# Patient Record
Sex: Male | Born: 1940 | Hispanic: Yes | Marital: Married | Smoking: Former smoker
Health system: Southern US, Community
[De-identification: ages and names within clinical notes are randomized; demographics above are authoritative.]

## PROBLEM LIST (undated history)

## (undated) DIAGNOSIS — K219 Gastro-esophageal reflux disease without esophagitis: Secondary | ICD-10-CM

## (undated) DIAGNOSIS — I10 Essential (primary) hypertension: Secondary | ICD-10-CM

## (undated) HISTORY — DX: Gastro-esophageal reflux disease without esophagitis: K21.9

## (undated) HISTORY — PX: NO PAST SURGERIES: SHX2092

## (undated) HISTORY — DX: Essential (primary) hypertension: I10

---

## 2012-09-11 DIAGNOSIS — M159 Polyosteoarthritis, unspecified: Secondary | ICD-10-CM | POA: Diagnosis not present

## 2012-09-11 DIAGNOSIS — IMO0001 Reserved for inherently not codable concepts without codable children: Secondary | ICD-10-CM | POA: Diagnosis not present

## 2012-09-11 DIAGNOSIS — IMO0002 Reserved for concepts with insufficient information to code with codable children: Secondary | ICD-10-CM | POA: Diagnosis not present

## 2012-09-11 DIAGNOSIS — M999 Biomechanical lesion, unspecified: Secondary | ICD-10-CM | POA: Diagnosis not present

## 2012-09-12 DIAGNOSIS — M999 Biomechanical lesion, unspecified: Secondary | ICD-10-CM | POA: Diagnosis not present

## 2012-09-12 DIAGNOSIS — M159 Polyosteoarthritis, unspecified: Secondary | ICD-10-CM | POA: Diagnosis not present

## 2012-09-12 DIAGNOSIS — IMO0002 Reserved for concepts with insufficient information to code with codable children: Secondary | ICD-10-CM | POA: Diagnosis not present

## 2012-09-12 DIAGNOSIS — IMO0001 Reserved for inherently not codable concepts without codable children: Secondary | ICD-10-CM | POA: Diagnosis not present

## 2012-09-20 DIAGNOSIS — IMO0001 Reserved for inherently not codable concepts without codable children: Secondary | ICD-10-CM | POA: Diagnosis not present

## 2012-09-20 DIAGNOSIS — IMO0002 Reserved for concepts with insufficient information to code with codable children: Secondary | ICD-10-CM | POA: Diagnosis not present

## 2012-09-20 DIAGNOSIS — M159 Polyosteoarthritis, unspecified: Secondary | ICD-10-CM | POA: Diagnosis not present

## 2012-09-20 DIAGNOSIS — M999 Biomechanical lesion, unspecified: Secondary | ICD-10-CM | POA: Diagnosis not present

## 2012-09-25 DIAGNOSIS — M159 Polyosteoarthritis, unspecified: Secondary | ICD-10-CM | POA: Diagnosis not present

## 2012-09-25 DIAGNOSIS — IMO0001 Reserved for inherently not codable concepts without codable children: Secondary | ICD-10-CM | POA: Diagnosis not present

## 2012-09-25 DIAGNOSIS — IMO0002 Reserved for concepts with insufficient information to code with codable children: Secondary | ICD-10-CM | POA: Diagnosis not present

## 2012-09-25 DIAGNOSIS — M999 Biomechanical lesion, unspecified: Secondary | ICD-10-CM | POA: Diagnosis not present

## 2012-10-02 DIAGNOSIS — IMO0001 Reserved for inherently not codable concepts without codable children: Secondary | ICD-10-CM | POA: Diagnosis not present

## 2012-10-02 DIAGNOSIS — M999 Biomechanical lesion, unspecified: Secondary | ICD-10-CM | POA: Diagnosis not present

## 2012-10-02 DIAGNOSIS — IMO0002 Reserved for concepts with insufficient information to code with codable children: Secondary | ICD-10-CM | POA: Diagnosis not present

## 2012-10-02 DIAGNOSIS — M159 Polyosteoarthritis, unspecified: Secondary | ICD-10-CM | POA: Diagnosis not present

## 2012-10-09 DIAGNOSIS — IMO0002 Reserved for concepts with insufficient information to code with codable children: Secondary | ICD-10-CM | POA: Diagnosis not present

## 2012-10-09 DIAGNOSIS — M999 Biomechanical lesion, unspecified: Secondary | ICD-10-CM | POA: Diagnosis not present

## 2012-10-09 DIAGNOSIS — M159 Polyosteoarthritis, unspecified: Secondary | ICD-10-CM | POA: Diagnosis not present

## 2012-10-09 DIAGNOSIS — IMO0001 Reserved for inherently not codable concepts without codable children: Secondary | ICD-10-CM | POA: Diagnosis not present

## 2012-10-16 DIAGNOSIS — M999 Biomechanical lesion, unspecified: Secondary | ICD-10-CM | POA: Diagnosis not present

## 2012-10-16 DIAGNOSIS — IMO0001 Reserved for inherently not codable concepts without codable children: Secondary | ICD-10-CM | POA: Diagnosis not present

## 2012-10-16 DIAGNOSIS — IMO0002 Reserved for concepts with insufficient information to code with codable children: Secondary | ICD-10-CM | POA: Diagnosis not present

## 2012-10-16 DIAGNOSIS — M159 Polyosteoarthritis, unspecified: Secondary | ICD-10-CM | POA: Diagnosis not present

## 2012-10-23 DIAGNOSIS — IMO0001 Reserved for inherently not codable concepts without codable children: Secondary | ICD-10-CM | POA: Diagnosis not present

## 2012-10-23 DIAGNOSIS — M999 Biomechanical lesion, unspecified: Secondary | ICD-10-CM | POA: Diagnosis not present

## 2012-10-23 DIAGNOSIS — IMO0002 Reserved for concepts with insufficient information to code with codable children: Secondary | ICD-10-CM | POA: Diagnosis not present

## 2012-10-23 DIAGNOSIS — M159 Polyosteoarthritis, unspecified: Secondary | ICD-10-CM | POA: Diagnosis not present

## 2012-10-30 DIAGNOSIS — IMO0001 Reserved for inherently not codable concepts without codable children: Secondary | ICD-10-CM | POA: Diagnosis not present

## 2012-10-30 DIAGNOSIS — M999 Biomechanical lesion, unspecified: Secondary | ICD-10-CM | POA: Diagnosis not present

## 2012-10-30 DIAGNOSIS — IMO0002 Reserved for concepts with insufficient information to code with codable children: Secondary | ICD-10-CM | POA: Diagnosis not present

## 2012-10-30 DIAGNOSIS — M159 Polyosteoarthritis, unspecified: Secondary | ICD-10-CM | POA: Diagnosis not present

## 2014-05-15 ENCOUNTER — Ambulatory Visit: Payer: Medicare Other | Attending: Family Medicine | Admitting: Family Medicine

## 2014-05-15 ENCOUNTER — Other Ambulatory Visit: Payer: Self-pay | Admitting: Family Medicine

## 2014-05-15 ENCOUNTER — Telehealth: Payer: Self-pay | Admitting: *Deleted

## 2014-05-15 ENCOUNTER — Ambulatory Visit (HOSPITAL_COMMUNITY)
Admission: RE | Admit: 2014-05-15 | Discharge: 2014-05-15 | Disposition: A | Discharge status: Home / Self Care | Payer: Medicare Other | Source: Ambulatory Visit | Attending: Family Medicine | Admitting: Family Medicine

## 2014-05-15 VITALS — BP 186/79 | HR 66 | Wt 170.4 lb

## 2014-05-15 DIAGNOSIS — M1612 Unilateral primary osteoarthritis, left hip: Secondary | ICD-10-CM | POA: Insufficient documentation

## 2014-05-15 DIAGNOSIS — Z0000 Encounter for general adult medical examination without abnormal findings: Secondary | ICD-10-CM | POA: Diagnosis not present

## 2014-05-15 DIAGNOSIS — M25579 Pain in unspecified ankle and joints of unspecified foot: Secondary | ICD-10-CM

## 2014-05-15 DIAGNOSIS — D72820 Lymphocytosis (symptomatic): Secondary | ICD-10-CM

## 2014-05-15 DIAGNOSIS — M25571 Pain in right ankle and joints of right foot: Secondary | ICD-10-CM | POA: Diagnosis not present

## 2014-05-15 DIAGNOSIS — M25552 Pain in left hip: Secondary | ICD-10-CM

## 2014-05-15 MED ORDER — LISINOPRIL 20 MG PO TABS: 20.0000 mg | ORAL_TABLET | Freq: Every day | ORAL | Status: DC

## 2014-05-15 MED ORDER — NAPROXEN 500 MG PO TABS: 500.0000 mg | ORAL_TABLET | Freq: Two times a day (BID) | ORAL | Status: DC

## 2014-05-15 NOTE — Patient Instructions (Signed)
Take naproxen with food twice a day. Go for x-ray for hip and foot. Follow-up with Dr. Adrian Blackwater as planned We will call about blood work if anything urgent, otherwise can discuss with Dr. Adrian Blackwater We are starting you on medication for high blood pressure. Take lisinopril once a day.

## 2014-05-15 NOTE — Progress Notes (Addendum)
Patient ID: Patrick Ortega, male   DOB: July 16, 1940, 74 y.o.   MRN: [id number]   Patrick Ortega, is a 74 y.o. male  BOF:[id number]  DPO:[id number]  DOB - [birth year]  CC:  Chief Complaint  Patient presents with  . Left hip pain    x 5 months   . Rt ankle pain       HPI: Patrick Ortega is a 74 y.o. male here today to establish medical care. He presents with hip pain as a presenting c/o.He denies other health problems. He was at one time told that his BP was high but he has never been treated. He has been experiencing the pain in his left hip and feet for about 7 months. He denies any definite injury. He has not tried any treatment.Due to language barrier, I feel unable to get an adequate description of the pain.He indicates the hip pain is in the joint and the foot pain is in the ankle.Information obtained with help of interpreter.   Not on File No past medical history on file. No current outpatient prescriptions on file prior to visit.   No current facility-administered medications on file prior to visit.   No family history on file. History   Social History  . Marital Status: Married    Spouse Name: N/A  . Number of Children: N/A  . Years of Education: N/A   Occupational History  . Not on file.   Social History Main Topics  . Smoking status: Not on file  . Smokeless tobacco: Not on file  . Alcohol Use: Not on file  . Drug Use: Not on file  . Sexual Activity: Not on file   Other Topics Concern  . Not on file   Social History Narrative  . No narrative on file    Review of Systems: Constitutional: Negative for fever, chills, appetite change, weight loss,  fatigue. HENT: Negative for ear pain, ear discharge.nose bleeds Eyes: Negative for pain, discharge, redness, itching and visual disturbance.Positive for growth on left eyeball Neck: Negative for pain, stiffness Respiratory: Negative for cough, chest  shortness of breath, ear  wheezing and stridor.  Cardiovascular:  Negative for chest pain, palpitations and leg swelling. Gastrointestinal: Negative for abdominal distention, abdominal pain, nausea, vomiting Genitourinary: Negative for dysuria, urgency, frequency, hematuria, flank pain,  Musculoskeletal: Negative for back pain, joint swelling, Positive for pain in left hip and both feet, greater in the right. Neurological: Negative for dizziness, tremors, seizures, syncope,  speech difficulty, weakness, light-headedness, numbness and headaches.  Hematological: Negative for easy bruising or bleeding Psychiatric/Behavioral:    Objective:   Filed Vitals:   05/15/14 1005  BP: 186/79  Pulse: 66    Physical Exam: Constitutional: Patient appears well-developed and well-nourished. No distress. HENT: Normocephalic, atraumatic, External right and left ear normal. Oropharynx is clear and moist.  Eyes:  PERRLA, no scleral icterus. There is a small pterygium on the left. Neck: Normal ROM. Neck supple. No JVD. No tracheal deviation. No thyromegaly. CVS: RRR, S1/S2 +, no murmurs, no gallops, no carotid bruit.  Pulmonary: Effort and breath sounds normal, no stridor, rhonchi, wheezes, rales.  Abdominal: Soft. BS +, no distension, tenderness, rebound or guarding.  Musculoskeletal: Normal range of motion. Except left hip. He does walk with a slight limp. There is pain with flexion and external and internal rotation. There is tenderness over the hip joint. No edema.  Lymphadenopathy: No lymphadenopathy noted, cervical, inguinal or axillary Neuro: Alert.Normal muscle tone coordination. No cranial nerve deficit. Skin:  Skin is warm and dry. No rash noted. Not diaphoretic. No erythema. No pallor. Psychiatric: Normal mood and affect. Behavior, judgment, thought content normal.  No results found for: WBC, HGB, HCT, MCV, PLT No results found for: CREATININE, BUN, NA, K, CL, CO2  No results found for: HGBA1C Lipid Panel  No results found for: CHOL, TRIG, HDL, CHOLHDL,  VLDL, LDLCALC     Assessment    1. Joint pain 2. Hypertension 3. Need for health care screening.  Plan: 1.  Naproxen 500 bid with food. 2.  Lisinopril 20 mg q day 3.  CMET, CBC, Lipid panel 4.  Follow-up with Dr. Adrian Blackwater next week. Has an appointment   NOTE:  The hip x-ray showed a lucency and bloodwork shows absolute lymphocytotis and abnormal lymphocytes on the smear. PLAN:  Referral to oncology hematologist.    The patient was given clear instructions to go to ER or return to medical center if symptoms don't improve, worsen or new problems develop. The patient verbalized understanding. The patient was told to call to get lab results if they haven't heard anything in the next week.     This note has been created with Surveyor, quantity. Any transcriptional errors are unintentional.    Micheline Chapman, MSN, FNP-BC [place], [place]   05/15/2014, 10:09 AM

## 2014-05-15 NOTE — Telephone Encounter (Signed)
[name] called to say Patrick Ortega N.P. Had put an order in for a left hip xray incorrectly.  They told me what to enter and I entered new order.

## 2014-05-16 LAB — CBC WITH DIFFERENTIAL/PLATELET
BASOS PCT: 1 % (ref 0–1)
Basophils Absolute: 0.1 10*3/uL (ref 0.0–0.1)
Eosinophils Absolute: 0.1 10*3/uL (ref 0.0–0.7)
Eosinophils Relative: 1 % (ref 0–5)
HCT: 43.4 % (ref 39.0–52.0)
HEMOGLOBIN: 14.3 g/dL (ref 13.0–17.0)
Lymphocytes Relative: 50 % — ABNORMAL HIGH (ref 12–46)
Lymphs Abs: 6.1 10*3/uL — ABNORMAL HIGH (ref 0.7–4.0)
MCH: 28.3 pg (ref 26.0–34.0)
MCHC: 32.9 g/dL (ref 30.0–36.0)
MCV: 85.9 fL (ref 78.0–100.0)
MPV: 8.9 fL (ref 8.6–12.4)
Monocytes Absolute: 1.1 10*3/uL — ABNORMAL HIGH (ref 0.1–1.0)
Monocytes Relative: 9 % (ref 3–12)
NEUTROS ABS: 4.7 10*3/uL (ref 1.7–7.7)
NEUTROS PCT: 39 % — AB (ref 43–77)
Platelets: 429 10*3/uL — ABNORMAL HIGH (ref 150–400)
RBC: 5.05 MIL/uL (ref 4.22–5.81)
RDW: 14.9 % (ref 11.5–15.5)
WBC: 12.1 10*3/uL — AB (ref 4.0–10.5)

## 2014-05-16 LAB — COMPLETE METABOLIC PANEL WITH GFR
ALT: 24 U/L (ref 0–53)
AST: 31 U/L (ref 0–37)
Albumin: 4.4 g/dL (ref 3.5–5.2)
Alkaline Phosphatase: 103 U/L (ref 39–117)
BILIRUBIN TOTAL: 0.7 mg/dL (ref 0.2–1.2)
BUN: 14 mg/dL (ref 6–23)
CALCIUM: 9.7 mg/dL (ref 8.4–10.5)
CHLORIDE: 102 meq/L (ref 96–112)
CO2: 26 mEq/L (ref 19–32)
CREATININE: 0.75 mg/dL (ref 0.50–1.35)
GFR, Est Non African American: >89 mL/min
Glucose, Bld: 90 mg/dL (ref 70–99)
Potassium: 4.8 mEq/L (ref 3.5–5.3)
Sodium: 138 mEq/L (ref 135–145)
Total Protein: 7.9 g/dL (ref 6.0–8.3)

## 2014-05-16 LAB — LIPID PANEL
CHOL/HDL RATIO: 3.1 ratio
Cholesterol: 157 mg/dL (ref 0–200)
HDL: 50 mg/dL (ref >=40)
LDL CALC: 76 mg/dL (ref 0–99)
Triglycerides: 154 mg/dL — ABNORMAL HIGH (ref <150)
VLDL: 31 mg/dL (ref 0–40)

## 2014-05-16 LAB — PATHOLOGIST SMEAR REVIEW

## 2014-05-21 NOTE — Addendum Note (Signed)
Addended by: Sharon Seller C on: 05/21/2014 12:55 PM   Modules accepted: Orders

## 2014-05-23 ENCOUNTER — Ambulatory Visit: Payer: Self-pay | Admitting: Family Medicine

## 2014-05-24 ENCOUNTER — Ambulatory Visit: Payer: Medicare Other | Attending: Family Medicine | Admitting: Family Medicine

## 2014-05-24 VITALS — BP 154/83 | HR 64 | Wt 167.2 lb

## 2014-05-24 DIAGNOSIS — I10 Essential (primary) hypertension: Secondary | ICD-10-CM | POA: Diagnosis not present

## 2014-05-24 DIAGNOSIS — M1612 Unilateral primary osteoarthritis, left hip: Secondary | ICD-10-CM | POA: Insufficient documentation

## 2014-05-24 DIAGNOSIS — D72820 Lymphocytosis (symptomatic): Secondary | ICD-10-CM | POA: Insufficient documentation

## 2014-05-24 MED ORDER — AMLODIPINE BESYLATE 5 MG PO TABS: 5.0000 mg | ORAL_TABLET | Freq: Every day | ORAL | Status: DC

## 2014-05-24 NOTE — Progress Notes (Signed)
Patient ID: Patrick Ortega, male   DOB: 12-Oct-1940, 74 y.o.   MRN: [id number]  HPI:  Patient presents today to discuss results of recent left hip x-ray and some lab work. He came last week complaining of left hip pain. In addition to an x-ray, I ordered routine labs.  The xray showed pretty severe osteoarthritis but also showed a lucency that could be suggestive of a metastatic lesion.When he bloodwork return it showed absolute lymphocytosis and abnormal lymphs on smear.  I have referred to hematologist/oncologist for further assessment. I have brought him and his wife in today to explain this to them with the help of Laurene Footman our [place] interpreter. He is on lisinopril for hypertension.  I have explained the situation and they express understanding and agreement to seeing the specialist.  I have informed that someone will call with details of the referral.  ROS. He denies any symptoms other than the pain in his hip. He denies excessive fatigue, shortness of breath, chest pain, fever or chills, There is no weight loss or loss of appetite  Exam:  Alert,oriented, appropriate in no acute distress, Skin is warm and dry. Lungs are clear to auscultation HS are regular. BP 154/83.   Assessment:  1. Possibility of Blood disorder  Referral to hematology/oncology.  2. Hypertension.  Add Amlodine 5 mg to his lisinopril. We will call and let him know about this.  He should follow-up here as previously planned to see PCP.

## 2014-05-27 ENCOUNTER — Telehealth: Payer: Self-pay | Admitting: Hematology and Oncology

## 2014-05-27 NOTE — Telephone Encounter (Signed)
patient appt 06/20 @ 3:15 w/Dr. Lindi Adie. S/w patient friend and gave new patient appt.

## 2014-06-24 ENCOUNTER — Ambulatory Visit: Payer: Medicare Other

## 2014-06-24 ENCOUNTER — Ambulatory Visit (HOSPITAL_BASED_OUTPATIENT_CLINIC_OR_DEPARTMENT_OTHER): Payer: Medicare Other | Admitting: Hematology and Oncology

## 2014-06-24 ENCOUNTER — Encounter: Payer: Self-pay | Admitting: Hematology and Oncology

## 2014-06-24 DIAGNOSIS — D473 Essential (hemorrhagic) thrombocythemia: Secondary | ICD-10-CM | POA: Diagnosis not present

## 2014-06-24 DIAGNOSIS — M1612 Unilateral primary osteoarthritis, left hip: Secondary | ICD-10-CM

## 2014-06-24 DIAGNOSIS — D72820 Lymphocytosis (symptomatic): Secondary | ICD-10-CM | POA: Diagnosis not present

## 2014-06-24 NOTE — Progress Notes (Signed)
Checked in new pt with no financial concerns. °

## 2014-06-24 NOTE — Assessment & Plan Note (Signed)
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 I discussed with the patient extensively the causes of elevation of lymphocyteswith the differential being viral infection versus normal variation versus lymphoma/CLL  Plan: Send flow cytometry If the flow cytometry is normal, there is no need to do additional testing.

## 2014-06-24 NOTE — Assessment & Plan Note (Addendum)
Severe left hip arthritis: For the past 5-6 years does continue to be significant. Recent x-rays showed degenerative changes. There was a suspicious lucency but I do not believe it is clinically significant or meaningful. 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

## 2014-06-24 NOTE — Progress Notes (Signed)
[facility] NOTE  Patient Care Team: Micheline Chapman, NP as PCP - General (Family Medicine)  CHIEF COMPLAINTS/PURPOSE OF CONSULTATION:  lymphocytosis  HISTORY OF PRESENTING ILLNESS:  Patrick Ortega 74 y.o. male is here because of recent diagnosis of lymphocytosis. He went to his wellness clinic with complaints of left hip pain and underwent x-rays and blood tests which showed an elevated white count of 12.1 and elevated lymphocyte count of 6.1. On the x-ray there was a lucency that was probably benign in his left hip. This led to a referral to [place] for follow-up. 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  I reviewed her records extensively and collaborated the history with the patient.  MEDICAL HISTORY:  Past Medical History  Diagnosis Date  . Hypertension   . GERD (gastroesophageal reflux disease)     SURGICAL HISTORY: History reviewed. No pertinent past surgical history.  SOCIAL HISTORY: History   Social History  . Marital Status: Married    Spouse Name: N/A  . Number of Children: N/A  . Years of Education: N/A   Occupational History  . Not on file.   Social History Main Topics  . Smoking status: Former Smoker -- 0.25 packs/day for .5 years    Types: Cigarettes  . Smokeless tobacco: Never Used  . Alcohol Use: 14.4 oz/week    24 Cans of beer per week  . Drug Use: Not on file  . Sexual Activity: Yes   Other Topics Concern  . Not on file   Social History Narrative  . No narrative on file    FAMILY HISTORY: History reviewed. No pertinent family history.  ALLERGIES:  has No Known Allergies.  MEDICATIONS:  Current Outpatient Prescriptions  Medication Sig Dispense Refill  . lisinopril (PRINIVIL,ZESTRIL) 20 MG tablet Take 1 tablet (20 mg total) by mouth daily. 90 tablet 3  . naproxen (NAPROSYN) 500 MG tablet Take 1 tablet (500 mg total) by mouth 2 (two) times daily with a meal. 30 tablet  0  . amLODipine (NORVASC) 5 MG tablet Take 1 tablet (5 mg total) by mouth daily. (Patient not taking: Reported on 06/24/2014) 90 tablet 3   No current facility-administered medications for this visit.    REVIEW OF SYSTEMS:   Constitutional: Denies fevers, chills or abnormal night sweats Eyes: Denies blurriness of vision, double vision or watery eyes Ears, nose, mouth, throat, and face: Denies mucositis or sore throat Respiratory: Denies cough, dyspnea or wheezes Cardiovascular: Denies palpitation, chest discomfort or lower extremity swelling Gastrointestinal:  Denies nausea, heartburn or change in bowel habits Skin: Denies abnormal skin rashes Lymphatics: Denies new lymphadenopathy or easy bruising Neurological:left hip pain Behavioral/Psych: Mood is stable, no new changes   All other systems were reviewed with the patient and are negative.  PHYSICAL EXAMINATION: ECOG PERFORMANCE STATUS: 1 - Symptomatic but completely ambulatory  Filed Vitals:   06/24/14 1549  BP: 146/73  Pulse: 58  Temp: 98.1 F (36.7 C)  Resp: 18   Filed Weights   06/24/14 1549  Weight: 163 lb 8 oz (74.163 kg)    GENERAL:alert, no distress and comfortable SKIN: skin color, texture, turgor are normal, no rashes or significant lesions EYES: normal, conjunctiva are pink and non-injected, sclera clear OROPHARYNX:no exudate, no erythema and lips, buccal mucosa, and tongue normal  NECK: supple, thyroid normal size, non-tender, without nodularity LYMPH:  no palpable lymphadenopathy in the cervical, axillary or inguinal LUNGS: clear to auscultation  and percussion with normal breathing effort HEART: regular rate & rhythm and no murmurs and no lower extremity edema ABDOMEN:abdomen soft, non-tender and normal bowel sounds Musculoskeletal:no cyanosis of digits and no clubbing  PSYCH: alert & oriented x 3 with fluent speech NEURO: no focal motor/sensory deficits  LABORATORY DATA:  I have reviewed the data as  listed Lab Results  Component Value Date   WBC 12.1* 05/15/2014   HGB 14.3 05/15/2014   HCT 43.4 05/15/2014   MCV 85.9 05/15/2014   PLT 429* 05/15/2014   Lab Results  Component Value Date   NA 138 05/15/2014   K 4.8 05/15/2014   CL 102 05/15/2014   CO2 26 05/15/2014    RADIOGRAPHIC STUDIES: I have personally reviewed the radiological reports and agreed with the findings in the report.  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 I discussed with the patient extensively the causes of elevation of lymphocyteswith the differential being viral infection versus normal variation versus lymphoma/CLL  Plan: Send flow cytometry If the flow cytometry is normal, there is no need to do additional testing.  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 Recent x-rays showed degenerative changes. There was a suspicious lucency but I do not believe it is clinically significant or meaningful. 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  Mild thrombocytosis: Acute phase reactant related to inflammation most likely. I will call the patient with the results of flow cytometry. If it is normal there is no need of further hematology follow-up.  All questions were answered. The patient knows to call the clinic with any problems, questions or concerns.    Rulon Eisenmenger, MD 5:30 PM

## 2014-06-25 ENCOUNTER — Other Ambulatory Visit (HOSPITAL_BASED_OUTPATIENT_CLINIC_OR_DEPARTMENT_OTHER): Payer: Medicare Other

## 2014-06-25 ENCOUNTER — Other Ambulatory Visit (HOSPITAL_COMMUNITY)
Admission: RE | Admit: 2014-06-25 | Discharge: 2014-06-25 | Disposition: A | Discharge status: Home / Self Care | Payer: Medicare Other | Source: Ambulatory Visit | Attending: Hematology and Oncology | Admitting: Hematology and Oncology

## 2014-06-25 DIAGNOSIS — D72820 Lymphocytosis (symptomatic): Secondary | ICD-10-CM | POA: Diagnosis not present

## 2014-06-25 DIAGNOSIS — D473 Essential (hemorrhagic) thrombocythemia: Secondary | ICD-10-CM | POA: Diagnosis not present

## 2014-06-25 LAB — CBC WITH DIFFERENTIAL/PLATELET
BASO%: 0.6 % (ref 0.0–2.0)
BASOS ABS: 0.1 10*3/uL (ref 0.0–0.1)
EOS ABS: 0.1 10*3/uL (ref 0.0–0.5)
EOS%: 0.6 % (ref 0.0–7.0)
HCT: 44.1 % (ref 38.4–49.9)
HEMOGLOBIN: 14.4 g/dL (ref 13.0–17.1)
LYMPH#: 4.7 10*3/uL — AB (ref 0.9–3.3)
LYMPH%: 31.2 % (ref 14.0–49.0)
MCH: 27.7 pg (ref 27.2–33.4)
MCHC: 32.5 g/dL (ref 32.0–36.0)
MCV: 85.2 fL (ref 79.3–98.0)
MONO#: 1.5 10*3/uL — ABNORMAL HIGH (ref 0.1–0.9)
MONO%: 9.8 % (ref 0.0–14.0)
NEUT%: 57.8 % (ref 39.0–75.0)
NEUTROS ABS: 8.8 10*3/uL — AB (ref 1.5–6.5)
Platelets: 412 10*3/uL — ABNORMAL HIGH (ref 140–400)
RBC: 5.18 10*6/uL (ref 4.20–5.82)
RDW: 13.6 % (ref 11.0–14.6)
WBC: 15.2 10*3/uL — AB (ref 4.0–10.3)

## 2014-06-25 LAB — TECHNOLOGIST REVIEW

## 2014-06-26 LAB — FLOW CYTOMETRY

## 2015-02-12 ENCOUNTER — Emergency Department (INDEPENDENT_AMBULATORY_CARE_PROVIDER_SITE_OTHER)
Admission: EM | Admit: 2015-02-12 | Discharge: 2015-02-12 | Disposition: A | Discharge status: Home / Self Care | Payer: Medicare Other | Source: Home / Self Care | Attending: Emergency Medicine | Admitting: Emergency Medicine

## 2015-02-12 ENCOUNTER — Emergency Department (INDEPENDENT_AMBULATORY_CARE_PROVIDER_SITE_OTHER): Payer: Medicare Other

## 2015-02-12 ENCOUNTER — Encounter (HOSPITAL_COMMUNITY): Payer: Self-pay | Admitting: Emergency Medicine

## 2015-02-12 DIAGNOSIS — M25571 Pain in right ankle and joints of right foot: Secondary | ICD-10-CM

## 2015-02-12 DIAGNOSIS — M25552 Pain in left hip: Secondary | ICD-10-CM

## 2015-02-12 DIAGNOSIS — S99911A Unspecified injury of right ankle, initial encounter: Secondary | ICD-10-CM | POA: Diagnosis not present

## 2015-02-12 MED ORDER — MELOXICAM 7.5 MG PO TABS: 7.5000 mg | ORAL_TABLET | Freq: Every day | ORAL | Status: DC

## 2015-02-12 NOTE — ED Provider Notes (Signed)
CSN: [id number]     Arrival date & time 02/12/15  56 History   First MD Initiated Contact with Patient 02/12/15 1911     Chief Complaint  Patient presents with  . Hip Pain    left  . Ankle Pain    right medial   (Consider location/radiation/quality/duration/timing/severity/associated sxs/prior Treatment) HPI  He is a 75 year old man here with his son for evaluation of left hip and right ankle pain. His son asked as Optometrist. He reports left lateral hip pain and right medial ankle pain for the last year. It has gradually been getting worse. The ankle pain is worse than the hip pain. Both are worse with weightbearing. He has not taken any medications. He states he was diagnosed with rheumatism previously.  Past Medical History  Diagnosis Date  . Hypertension   . GERD (gastroesophageal reflux disease)    History reviewed. No pertinent past surgical history. History reviewed. No pertinent family history. Social History  Substance Use Topics  . Smoking status: Former Smoker -- 0.25 packs/day for .5 years    Types: Cigarettes  . Smokeless tobacco: Never Used  . Alcohol Use: 14.4 oz/week    24 Cans of beer per week    Review of Systems As in history of present illness Allergies  Review of patient's allergies indicates no known allergies.  Home Medications   Prior to Admission medications   Medication Sig Start Date End Date Taking? Authorizing Provider  lisinopril (PRINIVIL,ZESTRIL) 20 MG tablet Take 1 tablet (20 mg total) by mouth daily. 05/15/14  Yes Micheline Chapman, NP  amLODipine (NORVASC) 5 MG tablet Take 1 tablet (5 mg total) by mouth daily. Patient not taking: Reported on 06/24/2014 05/24/14   Micheline Chapman, NP  meloxicam (MOBIC) 7.5 MG tablet Take 1 tablet (7.5 mg total) by mouth daily. 02/12/15   Melony Overly, MD  naproxen (NAPROSYN) 500 MG tablet Take 1 tablet (500 mg total) by mouth 2 (two) times daily with a meal. 05/15/14   Micheline Chapman, NP   Meds Ordered  and Administered this Visit  Medications - No data to display  BP 167/82 mmHg  Pulse 59  Temp(Src) 97.7 F (36.5 C) (Oral)  Resp 20  SpO2 97% No data found.   Physical Exam  Constitutional: He is oriented to person, place, and time. He appears well-developed and well-nourished. No distress.  Cardiovascular: Normal rate.   Pulmonary/Chest: Effort normal.  Musculoskeletal:  Right ankle: He has some mild swelling and tenderness just distal to the medial malleolus. No joint laxity. No bony tenderness. Left hip: Pain is located in the lateral hip. Full range of motion with minimal pain. He is tender over the greater trochanter.  Neurological: He is alert and oriented to person, place, and time.    ED Course  Procedures (including critical care time)  Labs Review Labs Reviewed - No data to display  Imaging Review Dg Ankle Complete Right  02/12/2015  CLINICAL DATA:  Right ankle injury 1 year ago, pain medial aspect. EXAM: RIGHT ANKLE - COMPLETE 3+ VIEW COMPARISON:  None. FINDINGS: There is no evidence of fracture, dislocation, or joint effusion. There is no evidence of arthropathy or other focal bone abnormality. Atherosclerotic vascular calcifications noted within the adjacent soft tissues. Surrounding soft tissues otherwise unremarkable. IMPRESSION: No acute findings.  No significant degenerative change seen. Electronically Signed   By: Franki Cabot M.D.   On: 02/12/2015 19:41      MDM   1.  Right ankle pain   2. Lateral pain of left hip    X-rays ankle unremarkable. I suspect he has some early arthritis. We'll treat with meloxicam and Tylenol as needed. Recommended follow-up with PCP as needed.    Melony Overly, MD 02/12/15 2046

## 2015-02-12 NOTE — Discharge Instructions (Signed)
His pain is coming from some arthritis. Take meloxicam daily for pain. He can also take Tylenol 3 times a day as needed for pain. He should ice his ankle and hip twice a day. Follow-up with his primary care doctor as needed.

## 2015-02-12 NOTE — ED Notes (Signed)
Pt has been suffering from right hip and left medial ankle pain for one year.

## 2015-03-27 ENCOUNTER — Encounter (HOSPITAL_COMMUNITY): Payer: Self-pay | Admitting: Emergency Medicine

## 2015-03-27 ENCOUNTER — Emergency Department (INDEPENDENT_AMBULATORY_CARE_PROVIDER_SITE_OTHER)
Admission: EM | Admit: 2015-03-27 | Discharge: 2015-03-27 | Disposition: A | Discharge status: Home / Self Care | Payer: Medicare Other | Source: Home / Self Care | Attending: Family Medicine | Admitting: Family Medicine

## 2015-03-27 DIAGNOSIS — M7062 Trochanteric bursitis, left hip: Secondary | ICD-10-CM | POA: Diagnosis not present

## 2015-03-27 MED ORDER — TRIAMCINOLONE ACETONIDE 40 MG/ML IJ SUSP
INTRAMUSCULAR | Status: AC
  Filled 2015-03-27: qty 1

## 2015-03-27 MED ORDER — LIDOCAINE HCL (PF) 2 % IJ SOLN
INTRAMUSCULAR | Status: AC
  Filled 2015-03-27: qty 2

## 2015-03-27 MED ORDER — MELOXICAM 7.5 MG PO TABS: 7.5000 mg | ORAL_TABLET | Freq: Every day | ORAL | Status: DC

## 2015-03-27 NOTE — Discharge Instructions (Signed)
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    Le hicimos una inyeccion de esteroides en la cadera izquierda para la bursitis que le esta dando el dolor de cadera.   Creo que ahora cuando la cadera se le mejore, el tobillo derecho tambien le va a Teacher, English as a foreign language.   Puede aplicarse hielo a la cadera hoy y [place]; tomar la meloxicam una tableta por dia, cuando la necesite.

## 2015-03-27 NOTE — ED Notes (Signed)
C/o left side hip pain onset x2 years... Seen here on 2/16 for similar sx... Given Meloxicam w/relief but has ran out Also c/o right foot pain and swelling onset x1 year.... Denies inj/trauma Slow gait... A&O x4... No acute distress.

## 2015-03-27 NOTE — ED Provider Notes (Signed)
CSN: [id number]     Arrival date & time 03/27/15  1509 History   First MD Initiated Contact with Patient 03/27/15 1746     Chief Complaint  Patient presents with  . Hip Pain  . Foot Pain   (Consider location/radiation/quality/duration/timing/severity/associated sxs/prior Treatment) Patient is a 75 y.o. male presenting with hip pain and lower extremity pain. The history is provided by the patient and a relative. No language interpreter was used.  Hip Pain  Foot Pain  Visit conducted in Spanish. Patient's son present for visit. Complaint of continued L hip pain, as well as pain along anterior aspect of R ankle. Was seen here in Feb 2017 and had xrays of R ankle and L hip, results reviewed by me. Has been taking meloxicam 7.5mg  daily with some relief in the hip pain. NO falls or injuries.  Works Statistician, wears heavy boots to work.   Past Medical History  Diagnosis Date  . Hypertension   . GERD (gastroesophageal reflux disease)    History reviewed. No pertinent past surgical history. No family history on file. Social History  Substance Use Topics  . Smoking status: Former Smoker -- 0.25 packs/day for .5 years    Types: Cigarettes  . Smokeless tobacco: Never Used  . Alcohol Use: 14.4 oz/week    24 Cans of beer per week    Review of Systems  Constitutional: Negative for fever, chills, diaphoresis and fatigue.    Allergies  Review of patient's allergies indicates no known allergies.  Home Medications   Prior to Admission medications   Medication Sig Start Date End Date Taking? Authorizing Provider  amLODipine (NORVASC) 5 MG tablet Take 1 tablet (5 mg total) by mouth daily. Patient not taking: Reported on 06/24/2014 05/24/14   Micheline Chapman, NP  lisinopril (PRINIVIL,ZESTRIL) 20 MG tablet Take 1 tablet (20 mg total) by mouth daily. 05/15/14   Micheline Chapman, NP  meloxicam (MOBIC) 7.5 MG tablet Take 1 tablet (7.5 mg total) by mouth daily. 03/27/15   Willeen Niece, MD   naproxen (NAPROSYN) 500 MG tablet Take 1 tablet (500 mg total) by mouth 2 (two) times daily with a meal. 05/15/14   Micheline Chapman, NP   Meds Ordered and Administered this Visit  Medications - No data to display  BP 189/64 mmHg  Pulse 74  Temp(Src) 97.7 F (36.5 C) (Oral)  Resp 18  SpO2 99% No data found.   Physical Exam  Constitutional: He appears well-developed and well-nourished. No distress.  Musculoskeletal:  Full ROM bilat hips (int/ext rotation, flexion, ab/adduction  Full ROM knees flexion and extension bilat.   No tenderness to palpate malleoli of R ankle. Full active dorsi/plantar flexion of R ankle. No redness; palpable dp pulse is noted. Sensation grossly intact.    Neurological:  Ambulates independently; Favors L hip when walking.   Skin: He is not diaphoretic.    ED Course  Procedures (including critical care time)  Labs Review Labs Reviewed - No data to display  Imaging Review No results found.   Visual Acuity Review  Right Eye Distance:   Left Eye Distance:   Bilateral Distance:    Right Eye Near:   Left Eye Near:    Bilateral Near:         MDM   1. Trochanteric bursitis of left hip    Procedure Note; Patient requesting bursa inj of L greater trochanteric bursa.  Discussed r/b with him. He agrees and gives verbal consent.  Area prepped in clean fashion. Injection of 2% lidocaine without epinephrine (3cc) and Kenalog-40 (1cc) into bursa. Tolerated well.   May use Meloxicam 7.5mg  daily if needed. Continue to ice. Off work until Monday (no note needed).   I expect the R ankle will improve as his gait improves.   Dalbert Mayotte, MD    Willeen Niece, MD 03/27/15 [phone]

## 2015-03-28 MED ORDER — MELOXICAM 7.5 MG PO TABS: 7.5000 mg | ORAL_TABLET | Freq: Every day | ORAL | Status: DC

## 2015-03-28 MED FILL — MELOXICAM 7.5 MG TABLET: 7.5 | 30 days supply | Qty: 30 | Fill #0

## 2016-08-04 ENCOUNTER — Ambulatory Visit: Payer: Medicare Other | Attending: Family Medicine | Admitting: Family Medicine

## 2016-08-04 ENCOUNTER — Encounter: Payer: Self-pay | Admitting: Family Medicine

## 2016-08-04 ENCOUNTER — Other Ambulatory Visit (HOSPITAL_COMMUNITY)
Admission: RE | Admit: 2016-08-04 | Discharge: 2016-08-04 | Disposition: A | Discharge status: Home / Self Care | Payer: Medicare Other | Source: Ambulatory Visit | Attending: Family Medicine | Admitting: Family Medicine

## 2016-08-04 VITALS — BP 156/80 | HR 63 | Temp 98.1°F | Resp 18 | Ht 62.0 in | Wt 176.4 lb

## 2016-08-04 DIAGNOSIS — G8929 Other chronic pain: Secondary | ICD-10-CM | POA: Insufficient documentation

## 2016-08-04 DIAGNOSIS — M79671 Pain in right foot: Secondary | ICD-10-CM | POA: Insufficient documentation

## 2016-08-04 DIAGNOSIS — Z125 Encounter for screening for malignant neoplasm of prostate: Secondary | ICD-10-CM | POA: Insufficient documentation

## 2016-08-04 DIAGNOSIS — I10 Essential (primary) hypertension: Secondary | ICD-10-CM | POA: Diagnosis not present

## 2016-08-04 DIAGNOSIS — Z0000 Encounter for general adult medical examination without abnormal findings: Secondary | ICD-10-CM | POA: Insufficient documentation

## 2016-08-04 DIAGNOSIS — R300 Dysuria: Secondary | ICD-10-CM

## 2016-08-04 DIAGNOSIS — Z1211 Encounter for screening for malignant neoplasm of colon: Secondary | ICD-10-CM | POA: Insufficient documentation

## 2016-08-04 LAB — POCT URINALYSIS DIPSTICK
Bilirubin, UA: NEGATIVE
Blood, UA: NEGATIVE
GLUCOSE UA: NEGATIVE
Ketones, UA: NEGATIVE
Leukocytes, UA: NEGATIVE
NITRITE UA: NEGATIVE
PROTEIN UA: NEGATIVE
SPEC GRAV UA: 1.02 (ref 1.010–1.025)
Urobilinogen, UA: 0.2 E.U./dL
pH, UA: 6.5 (ref 5.0–8.0)

## 2016-08-04 LAB — POCT UA - MICROALBUMIN
Albumin/Creatinine Ratio, Urine, POC: 30
CREATININE, POC: 300 mg/dL
MICROALBUMIN (UR) POC: 80 mg/L

## 2016-08-04 MED ORDER — LISINOPRIL 20 MG PO TABS: 20.0000 mg | ORAL_TABLET | Freq: Every day | ORAL | 2 refills | Status: DC

## 2016-08-04 MED ORDER — NAPROXEN 500 MG PO TABS: 500.0000 mg | ORAL_TABLET | Freq: Two times a day (BID) | ORAL | 0 refills | Status: DC

## 2016-08-04 MED FILL — LISINOPRIL 20 MG TAB: 20 | 30 days supply | Qty: 30 | Fill #0

## 2016-08-04 MED FILL — NAPROXEN 500 MG TABLET: 500 | 15 days supply | Qty: 30 | Fill #0

## 2016-08-04 NOTE — Patient Instructions (Signed)
Hipertensin  Hypertension  El trmino hipertensin es otra forma de denominar a la presin arterial elevada. La presin arterial elevada fuerza al corazn a trabajar ms para bombear la sangre. Esto puede causar problemas con el paso del tiempo.  Una lectura de presin arterial est compuesta por 2 nmeros. Hay un nmero superior (sistlico) sobre un nmero inferior (diastlico). Lo ideal es tener la presin arterial por debajo de 120/80. Las decisiones saludables pueden ayudarle a disminuir su presin arterial. Es posible que necesite medicamentos que le ayuden a disminuir su presin arterial si:   Su presin arterial no disminuye mediante decisiones saludables.   Su presin arterial est por encima de 130/80.    Siga estas instrucciones en su casa:  Comida y bebida   Si se lo indican, siga el plan de alimentacin de DASH (Dietary Approaches to Stop Hypertension, Maneras de alimentarse para detener la hipertensin). Esta dieta incluye:  ? Que la mitad del plato de cada comida sea de frutas y verduras.  ? Que un cuarto del plato de cada comida sea de cereales integrales. Los cereales integrales incluyen pasta integral, arroz integral y pan integral.  ? Comer y beber productos lcteos con bajo contenido de grasa, como leche descremada o yogur bajo en grasas.  ? Que un cuarto del plato de cada comida sea de protenas bajas en grasa (magras). Las protenas bajas en grasa incluyen pescado, pollo sin piel, huevos, frijoles y tofu.  ? Evitar consumir carne grasa, carne curada y procesada, o pollo con piel.  ? Evitar consumir alimentos prehechos o procesados.   Consuma menos de 1500 mg de sal (sodio) por da.   Limite el consumo de alcohol a no ms de 1 medida por da si es mujer y no est embarazada y a 2 medidas por da si es hombre. Una medida equivale a 12onzas de cerveza, 5onzas de vino o 1onzas de bebidas alcohlicas de alta graduacin.  Estilo de vida   Trabaje con su mdico para mantenerse en un peso  saludable o para perder peso. Pregntele a su mdico cul es el peso recomendable para usted.   Realice al menos 30 minutos de ejercicio que haga que se acelere su corazn (ejercicio aerbico) la mayora de los das de la semana. Estos pueden incluir caminar, nadar o andar en bicicleta.   Realice al menos 30 minutos de ejercicio que fortalezca sus msculos (ejercicios de resistencia) al menos 3 das a la semana. Estos pueden incluir levantar pesas o hacer pilates.   No consuma ningn producto que contenga nicotina o tabaco. Esto incluye cigarrillos y cigarrillos electrnicos. Si necesita ayuda para dejar de fumar, consulte al mdico.   Controle su presin arterial en su casa tal como le indic el mdico.   Concurra a todas las visitas de control como se lo haya indicado el mdico. Esto es importante.  Medicamentos   Tome los medicamentos de venta libre y los recetados solamente como se lo haya indicado el mdico. Siga cuidadosamente las indicaciones.   No omita las dosis de medicamentos para la presin arterial. Los medicamentos pierden eficacia si omite dosis. El hecho de omitir las dosis tambin aumenta el riesgo de otros problemas.   Pregntele a su mdico a qu efectos secundarios o reacciones a los medicamentos debe prestar atencin.  Comunquese con un mdico si:   Piensa que tiene una reaccin a los medicamentos que est tomando.   Tiene dolores de cabeza frecuentes (recurrentes).   Siente mareos.   Tiene hinchazn   en los tobillos.   Tiene problemas de visin.  Solicite ayuda de inmediato si:   Siente un dolor de cabeza muy intenso.   Comienza a sentirse confundido.   Se siente dbil o adormecido.   Siente que va a desmayarse.   Siente un dolor muy intenso en:  ? El pecho.  ? El vientre (abdomen).   Devuelve (vomita) ms de una vez.   Tiene dificultad para respirar.  Resumen   El trmino hipertensin es otra forma de denominar a la presin arterial elevada.   Las decisiones saludables  pueden ayudarle a disminuir su presin arterial. Si no puede controlar su presin arterial mediante decisiones saludables, es posible que deba tomar medicamentos.  Esta informacin no tiene como fin reemplazar el consejo del mdico. Asegrese de hacerle al mdico cualquier pregunta que tenga.  Document Released: 06/10/2009 Document Revised: 12/03/2015 Document Reviewed: 12/03/2015  Elsevier Interactive Patient Education  2018 Elsevier Inc.

## 2016-08-04 NOTE — Progress Notes (Signed)
Subjective:  Patient ID: Patrick Ortega, male    DOB: 09-26-1940  Age: 76 y.o. MRN: [id number]  CC: Establish Care   HPI Patrick Ortega presents for history of hypertension. He is not exercising and is not adherent to low salt diet. He does not check at home.She reports not taking her lisinopril for 1 year. Cardiac symptoms none. Patient denies chest pain, chest pressure/discomfort, claudication, dyspnea, lower extremity edema, near-syncope, palpitations and syncope.  Cardiovascular risk factors: none. Use of agents associated with hypertension: none. History of target organ damage: none. He complains of dysuria. burning with urination He has had symptoms for 6 months. Patient also complains of mild pelvic and back pain. Patient denies fever and malodours urine, cloudy urine, hematuria, or urinary frequency. Patient does not have a history of recurrent UTI.  Patient does not have a history of pyelonephritis. He denies family history of prostate cancer.  He a;sp complains of arthralgias for which has been present for 1 year. Pain is located in the right ankle(s), is described as aching, and is intermittent .  Associated symptoms include: none.  The patient has tried nothing for pain relief.  Related to injury: no.   Outpatient Medications Prior to Visit  Medication Sig Dispense Refill  . amLODipine (NORVASC) 5 MG tablet Take 1 tablet (5 mg total) by mouth daily. (Patient not taking: Reported on 06/24/2014) 90 tablet 3  . meloxicam (MOBIC) 7.5 MG tablet Take 1 tablet (7.5 mg total) by mouth daily. (Patient not taking: Reported on 08/04/2016) 30 tablet 0  . lisinopril (PRINIVIL,ZESTRIL) 20 MG tablet Take 1 tablet (20 mg total) by mouth daily. (Patient not taking: Reported on 08/04/2016) 90 tablet 3  . naproxen (NAPROSYN) 500 MG tablet Take 1 tablet (500 mg total) by mouth 2 (two) times daily with a meal. (Patient not taking: Reported on 08/04/2016) 30 tablet 0   No facility-administered medications prior to  visit.     ROS Review of Systems  Constitutional: Negative.   Eyes: Negative.   Respiratory: Negative.   Cardiovascular: Negative.   Gastrointestinal: Positive for abdominal pain (pelvic).  Genitourinary: Positive for dysuria.  Musculoskeletal: Positive for back pain.  Skin: Negative.   Psychiatric/Behavioral: Negative.    Objective:  BP (!) 156/80 (BP Location: Right Arm, Patient Position: Sitting, Cuff Size: Large)   Pulse 63   Temp 98.1 F (36.7 C) (Oral)   Resp 18   Ht _0  (1.575 m)   Wt 176 lb 6.4 oz (80 kg)   SpO2 99%   BMI 32.26 kg/m   BP/Weight 08/04/2016 03/28/4980 06/07/1581  Systolic BP 094 076 808  Diastolic BP 80 64 82  Wt. (Lbs) 176.4 - -  BMI 32.26 - -   Physical Exam  Constitutional: He appears well-developed and well-nourished.  Eyes: Pupils are equal, round, and reactive to light. Conjunctivae are normal.  Neck: No JVD present.  Cardiovascular: Normal rate, regular rhythm, normal heart sounds and intact distal pulses.   Pulmonary/Chest: Effort normal and breath sounds normal.  Abdominal: Soft. Bowel sounds are normal. There is no tenderness.  Genitourinary:  Genitourinary Comments: Patient refused digit rectal examination.  Skin: Skin is warm and dry.  Psychiatric: He has a normal mood and affect. He expresses no homicidal and no suicidal ideation. He expresses no suicidal plans and no homicidal plans.  Behavior inappropriate to situation.  Nursing note and vitals reviewed.  Assessment & Plan:   Problem List Items Addressed This Visit  Cardiovascular and Mediastinum   Essential hypertension - Primary   Schedule BP recheck in 2 weeks with clinic RN.   If BP is greater than 90/60 (MAP 65 or greater) but not less than 130/80 may increase dose of     Lisinopril to 40 mg QD and recheck in another 2 weeks with clinic RN.   Follow up with PCP in 3 months.   Relevant Medications   lisinopril (PRINIVIL,ZESTRIL) 20 MG tablet   Other Relevant Orders     CMP14+EGFR (Completed)   Lipid Panel (Completed)   POCT UA - Microalbumin (Completed)    Other Visit Diagnoses    Dysuria       Relevant Orders   Urinalysis Dipstick (Completed)   DG Abd 1 View   Urine cytology ancillary only   Chronic foot pain, right       Relevant Medications   naproxen (NAPROSYN) 500 MG tablet   Other Relevant Orders   Rheumatoid factor (Completed)   ANA (Completed)   Uric Acid (Completed)   Screening for colon cancer       Patient refused digit rectal exam and colonoscopy referral.   Screening for prostate cancer       Patient refused digital rectal exam for prostate check.   Relevant Orders   PSA (Completed)   Healthcare maintenance       Relevant Medications   lisinopril (PRINIVIL,ZESTRIL) 20 MG tablet      Meds ordered this encounter  Medications  . naproxen (NAPROSYN) 500 MG tablet    Sig: Take 1 tablet (500 mg total) by mouth 2 (two) times daily with a meal.    Dispense:  30 tablet    Refill:  0    Order Specific Question:   Supervising Provider    Answer:   Tresa Garter [id number]  . lisinopril (PRINIVIL,ZESTRIL) 20 MG tablet    Sig: Take 1 tablet (20 mg total) by mouth daily.    Dispense:  30 tablet    Refill:  2    Order Specific Question:   Supervising Provider    Answer:   Tresa Garter [id number]    Follow-up: Return in about 2 weeks (around 08/18/2016) for BP check .   Alfonse Spruce FNP

## 2016-08-04 NOTE — Progress Notes (Signed)
Patient has not eaten  Patient has not had medication

## 2016-08-05 LAB — ANA: ANA: NEGATIVE

## 2016-08-05 LAB — CMP14+EGFR
A/G RATIO: 1.3 (ref 1.2–2.2)
ALT: 19 IU/L (ref 0–44)
AST: 28 IU/L (ref 0–40)
Albumin: 4.5 g/dL (ref 3.5–4.8)
Alkaline Phosphatase: 103 IU/L (ref 39–117)
BILIRUBIN TOTAL: 0.4 mg/dL (ref 0.0–1.2)
BUN/Creatinine Ratio: 13 (ref 10–24)
BUN: 14 mg/dL (ref 8–27)
CHLORIDE: 101 mmol/L (ref 96–106)
CO2: 23 mmol/L (ref 20–29)
Calcium: 9.6 mg/dL (ref 8.6–10.2)
Creatinine, Ser: 1.04 mg/dL (ref 0.76–1.27)
GFR calc Af Amer: 80 mL/min/{1.73_m2} (ref >59)
GFR calc non Af Amer: 69 mL/min/{1.73_m2} (ref >59)
Globulin, Total: 3.5 g/dL (ref 1.5–4.5)
Glucose: 90 mg/dL (ref 65–99)
POTASSIUM: 5.1 mmol/L (ref 3.5–5.2)
Sodium: 140 mmol/L (ref 134–144)
Total Protein: 8 g/dL (ref 6.0–8.5)

## 2016-08-05 LAB — URIC ACID: URIC ACID: 5.6 mg/dL (ref 3.7–8.6)

## 2016-08-05 LAB — LIPID PANEL
Chol/HDL Ratio: 3.1 ratio (ref 0.0–5.0)
Cholesterol, Total: 158 mg/dL (ref 100–199)
HDL: 51 mg/dL (ref >39)
LDL Calculated: 78 mg/dL (ref 0–99)
TRIGLYCERIDES: 144 mg/dL (ref 0–149)
VLDL CHOLESTEROL CAL: 29 mg/dL (ref 5–40)

## 2016-08-05 LAB — RHEUMATOID FACTOR: Rhuematoid fact SerPl-aCnc: 14.2 IU/mL — ABNORMAL HIGH (ref 0.0–13.9)

## 2016-08-05 LAB — PSA: PROSTATE SPECIFIC AG, SERUM: 3.8 ng/mL (ref 0.0–4.0)

## 2016-08-09 LAB — URINE CYTOLOGY ANCILLARY ONLY
Bacterial vaginitis: NEGATIVE
CHLAMYDIA, DNA PROBE: NEGATIVE
Candida vaginitis: NEGATIVE
Neisseria Gonorrhea: NEGATIVE
Trichomonas: NEGATIVE

## 2016-08-12 ENCOUNTER — Other Ambulatory Visit: Payer: Self-pay | Admitting: Family Medicine

## 2016-08-12 DIAGNOSIS — M059 Rheumatoid arthritis with rheumatoid factor, unspecified: Secondary | ICD-10-CM

## 2016-08-12 MED ORDER — METHOTREXATE SODIUM 7.5 MG PO TABS: ORAL_TABLET | ORAL | 1 refills | Status: DC

## 2016-08-13 ENCOUNTER — Telehealth: Payer: Self-pay

## 2016-08-13 NOTE — Telephone Encounter (Signed)
CMA call regarding lab results  Patient did not answer but a family member answer left a ,message with them stating the reason of the call & to call me back whenever he has a chance

## 2016-08-13 NOTE — Telephone Encounter (Signed)
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 You will be prescribed methotrexate. -Uric acid test is normal. When uric acid is high it can cause gout. -PSA is normal. PSA is screens for prostate problems Urine screen for Gonorrhea, Chlamydia, BV, Yeast, and Trichomonas were all negative. Labs that evaluated your blood cells, fluid and electrolyte balance are normal. No signs of anemia, infection, or inflammation present. Liver function normal Kidney function normal Lipid levels were normal.  Eating a diet low in saturated fat. Limit your intake of fried foods, red meats, and whole milk.

## 2017-08-09 ENCOUNTER — Encounter: Payer: Self-pay | Admitting: Family Medicine

## 2017-08-09 ENCOUNTER — Ambulatory Visit: Payer: Medicare Other | Attending: Family Medicine | Admitting: Family Medicine

## 2017-08-09 VITALS — BP 177/86 | HR 74 | Temp 98.2°F | Resp 18 | Ht 61.0 in | Wt 185.0 lb

## 2017-08-09 DIAGNOSIS — K219 Gastro-esophageal reflux disease without esophagitis: Secondary | ICD-10-CM | POA: Diagnosis not present

## 2017-08-09 DIAGNOSIS — I10 Essential (primary) hypertension: Secondary | ICD-10-CM

## 2017-08-09 DIAGNOSIS — M1612 Unilateral primary osteoarthritis, left hip: Secondary | ICD-10-CM | POA: Diagnosis not present

## 2017-08-09 DIAGNOSIS — Z87891 Personal history of nicotine dependence: Secondary | ICD-10-CM | POA: Diagnosis not present

## 2017-08-09 DIAGNOSIS — G8929 Other chronic pain: Secondary | ICD-10-CM | POA: Diagnosis not present

## 2017-08-09 DIAGNOSIS — M25552 Pain in left hip: Secondary | ICD-10-CM | POA: Diagnosis not present

## 2017-08-09 MED ORDER — MELOXICAM 15 MG PO TABS: 15.0000 mg | ORAL_TABLET | Freq: Every day | ORAL | 3 refills | Status: DC

## 2017-08-09 MED ORDER — AMLODIPINE BESYLATE 5 MG PO TABS: 5.0000 mg | ORAL_TABLET | Freq: Every day | ORAL | 6 refills | Status: DC

## 2017-08-09 MED ORDER — LISINOPRIL 20 MG PO TABS: 20.0000 mg | ORAL_TABLET | Freq: Every day | ORAL | 6 refills | Status: DC

## 2017-08-09 MED FILL — LISINOPRIL 20 MG TAB: 20 | 30 days supply | Qty: 30 | Fill #0

## 2017-08-09 MED FILL — MELOXICAM 15 MG TABLET: 15 | 30 days supply | Qty: 30 | Fill #0 | Status: TO

## 2017-08-09 MED FILL — AMLODIPINE BESYLATE 5 MG TA: 5 | 30 days supply | Qty: 30 | Fill #0

## 2017-08-09 NOTE — Patient Instructions (Signed)
Artrosis Osteoarthritis La artrosis es un tipo de reumatismo articular que afecta el tejido que cubre los extremos de los huesos en las articulaciones (cartlago). El cartlago acta como amortiguador [employer] y los ayuda a moverse con suavidad. La artrosis se produce cuando el cartlago de las articulaciones se gasta. A veces, la artrosis se denomina reumatismo articular "por uso y desgaste". La artrosis es la forma ms frecuente de reumatismo articular. A menudo, afecta a las [employer] American. Es una enfermedad que empeora con el tiempo (una enfermedad progresiva). Esta enfermedad afecta con ms frecuencia las articulaciones de:  Los dedos de [employer]ott.  Los dedos Kellogg.  Las caderas.  Las rodillas.  La columna vertebral, incluido el cuello y la zona lumbar.  Cules son las causas? Esta enfermedad es causada por el desgaste del cartlago que cubre los extremos de [place], lo cual tiene relacin con la edad. Qu incrementa el riesgo? Los siguientes factores pueden hacer que usted sea ms propenso a tener esta enfermedad:  Edad avanzada.  Tener exceso de [place] u obesidad.  Uso excesivo de las articulaciones, como en el caso de los [place].  Lesin pasada de Insurance claims handler.  Ciruga pasada en una articulacin.  Antecedentes familiares de artrosis.  Cules son los signos o los sntomas? Los principales sntomas de esta enfermedad son dolor, hinchazn y Advertising account executive. Con el tiempo, la articulacin puede perder su forma. Se pueden desprender pequeos trozos de [employer] o cartlago y flotar dentro de la articulacin, lo cual puede causar ms dolor y Agricultural consultant. Pueden formarse pequeos depsitos de hueso (ostefitos) en los extremos de Water engineer. Otros sntomas pueden incluir lo siguiente:  Una sensacin de chirrido o raspado dentro de la articulacin al moverla.  Sonidos de chasquido o crujido al [employer].  Los sntomas pueden  afectar una o ms articulaciones. La artrosis en una articulacin principal, como la rodilla o la cadera, puede causar dolor al caminar o al realizar ejercicio. Si tiene artrosis [employer], es posible que no pueda agarrar objetos, torcer la mano o controlar pequeos movimientos de las manos y los dedos (motricidad fina). Cmo se diagnostica? Esta enfermedad se puede diagnosticar en funcin de lo siguiente:  Sus antecedentes mdicos.  Un examen fsico.  Sus sntomas.  Radiografas de la(s) articulacin(es) afectada(s).  Anlisis de sangre para descartar otros tipos de reumatismo articular.  Cmo se trata? No hay cura para esta enfermedad, pero el tratamiento puede ayudar a Financial controller y Teacher, English as a foreign language el funcionamiento de Water engineer. Los planes de tratamiento pueden incluir lo siguiente:  Un programa de ejercicios indicado que permita el descanso y el alivio de la articulacin. Puede trabajar con un fisioterapeuta.  Un plan de control del peso.  Tcnicas de [employer], como las siguientes: ? Aplicacin de calor y fro en la articulacin. ? Impulsos elctricos aplicados a las terminaciones nerviosas que se encuentran debajo de la piel (neuroestimulacin elctrica transcutnea [TENS]). ? Masajes. ? Ciertos suplementos nutricionales.  Antiiflamatorios no esteroideos o medicamentos recetados para ayudar a Best boy.  Medicamentos para ayudar a Best boy y la inflamacin (corticoesteroides). Estos se pueden administrar por boca (va oral) o mediante una inyeccin.  Dispositivos de [place], como un dispositivo ortopdico, una frula, un guante especial o un bastn.  Ciruga, como: ? Imelda Pillow. Se hace para reposicionar los huesos y Best boy o para retirar los trozos sueltos de hueso y Database administrator. ? Ciruga de  reemplazo articular. Es posible que necesite esta ciruga si tiene una artrosis muy grave (avanzada).  Siga estas instrucciones en su  casa: Actividad  Haga descansar las articulaciones afectadas segn las indicaciones del mdico.  No conduzca ni use maquinaria pesada mientras toma analgsicos recetados.  Practique los ejercicios que le indiquen. Es posible que el mdico o el fisioterapeuta le recomienden tipos especficos de ejercicios, tales como: ? Ejercicios de fortalecimiento. Se realizan para fortalecer los msculos que sostienen las articulaciones afectadas por el reumatismo. Pueden realizarse con peso o con bandas para agregar resistencia. ? Actividades Precious Haws. Son ejercicios, como caminar a paso ligero o hacer gimnasia [place] acutica, que aumentan la actividad del corazn. ? Actividades de amplitud de movimientos. [place] articulaciones. ? Ejercicios de equilibrio y [place]. Control del dolor, la rigidez y la hinchazn  Si se lo indican, aplique calor en la zona afectada tan frecuentemente como se lo haya indicado el mdico. Use la fuente de calor que el mdico le recomiende, como una compresa de calor hmedo o una almohadilla trmica. ? Si tiene un dispositivo de ayuda que se puede quitar, quteselo segn lo indicado por su mdico. ? Colquese una toalla entre la piel y la fuente de Freight forwarder. Si el mdico le indica que no se quite el dispositivo de [employer] se Passenger transport manager, coloque una toalla entre el dispositivo de [place] y la fuente de Freight forwarder. ? Aplique el calor durante 20 a 30minutos. ? Retire la fuente de calor si la piel se le pone de color rojo brillante. Esto es muy importante si no puede sentir el dolor, el calor ni el fro. Puede correr un riesgo mayor de sufrir quemaduras.  Si se lo indican, aplique hielo sobre la articulacin afectada: ? Si tiene un dispositivo de ayuda que se puede quitar, quteselo segn lo indicado por su mdico. ? Ponga el hielo en una bolsa plstica. ? Colquese una [employer] piel y la bolsa de hielo. Si el mdico le indica que no se quite el  dispositivo de HCA Inc se aplica hielo, coloque una toalla entre el dispositivo de [place] y la bolsa de hielo. ? Coloque el hielo durante 6minutos, 2 o 3veces por da. Instrucciones generales  [employer] de venta libre y los recetados solamente como se lo haya indicado el mdico.  Mantenga un peso saludable. Siga las instrucciones de su mdico con respecto al control del [place]. Estas pueden incluir restricciones en la dieta.  No consuma ningn producto que contenga nicotina o tabaco, como cigarrillos y Psychologist, sport and exercise. Estos pueden retrasar la consolidacin del [place]. Si necesita ayuda para dejar de fumar, consulte al [employer].  Use los dispositivos de [employer] se lo haya indicado el mdico.  Concurra a todas las visitas de control como se lo haya indicado el mdico. Esto es importante. Dnde encontrar ms informacin:  Air traffic controller de Reumatismo Articular y Arboriculturist Musculoesquelticas y Insurance underwriter [facility] of Arthritis and Musculoskeletal and Skin Diseases): www.niams.SouthExposed.es  [place] ([employer] on Aging): http://kim-miller.com/  [facility]umatolog[facility] ([place] of Rheumatology): www.rheumatology.org Comunquese con un mdico si:  La piel se pone roja.  Le aparece una erupcin cutnea.  Siente un dolor que [place].  Tiene fiebre y siente dolor en la articulacin o el msculo. Solicite ayuda de inmediato si:  [employer]s.  Pierde el apetito repentinamente.  Tiene sudoracin nocturna. Resumen  La artrosis es un tipo de reumatismo articular que afecta  el tejido que cubre los extremos de los huesos en las articulaciones (cartlago).  Esta enfermedad es causada por el desgaste del cartlago que cubre los extremos de [place], lo cual tiene relacin con la edad.  Los principales sntomas de esta enfermedad son dolor, hinchazn y Futures trader.  No hay cura para esta enfermedad, pero el tratamiento puede ayudar a Financial controller y Teacher, English as a foreign language el funcionamiento de Water engineer. Esta informacin no tiene Marine scientist el consejo del mdico. Asegrese de hacerle al mdico cualquier pregunta que tenga. Document Released: 09/30/2004 Document Revised: 11/17/2015 Document Reviewed: 08/28/2012 Elsevier Interactive Patient Education  [employer].

## 2017-08-09 NOTE — Progress Notes (Signed)
Subjective:    Patient ID: Patrick Ortega, male    DOB: [birth year], 77 y.o.   MRN: [id number] Due to language barrier, an interpreter was present during the history-taking and subsequent discussion and  the physical exam with this patient. Grandson who is in nursing school and who speaks English was also present.  HPI 77 yo male who was last seen in the office on 08/04/16 who returns to re-establish ongoing care of his chronic medical issues. Patient reports that he has been out of his blood pressure medication and went to the ED due to headache and dizziness and he was told to follow-up with his primary care doctor for refills of his medication. Through interpreter he said that he had been out of medication for almost a year but he may have confused this for when he was last seen. Patient told the nurse today that he had only been out of the medication for a month. Patient denies any chest pain or shortness of breath. No swelling in his legs. Patient reports no problems with use of his blood pressure medications in the past.  He continues to have a small amount of dizziness and headache.     Patient also with complaint of severe pain in his left hip which causes him to limp. He has not taken any over the counter medications for his pain and is not taking the medications prescribed in the past.  Patient reports that many years ago when he was younger and lived in [place], he was carrying a large bag on his shoulder and he dropped the bag on his foot and he believes that this is what initially started his hip pain.  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  Patient reports that he has gone to a chiropractor and had x-rays of his hip but treatments given did not relieve his pain.  Patient also had acupuncture which did seem to briefly help lessen his pain.  Patient denies any radiation of pain, patient states that pain stays on the outside of his left hip and is very bad.  Pain is worse with movement.  Pain  can be sharp and throbbing and is an 8 or 10 on a 0-to-10 scale. Past Medical History:  Diagnosis Date  . GERD (gastroesophageal reflux disease)   . Hypertension    Social History   Tobacco Use  . Smoking status: Former Smoker    Packs/day: 0.25    Years: 0.50    Pack years: 0.12    Types: Cigarettes  . Smokeless tobacco: Never Used  Substance Use Topics  . Alcohol use: Yes    Alcohol/week: 14.4 oz    Types: 24 Cans of beer per week  . Drug use: Not on file  No Known Allergies  Review of Systems  Constitutional: Positive for fatigue.  Eyes: Negative for visual disturbance.  Respiratory: Negative for cough and shortness of breath.   Cardiovascular: Negative for chest pain, palpitations and leg swelling.  Gastrointestinal: Negative for abdominal pain, blood in stool and constipation.  Genitourinary: Negative for difficulty urinating and frequency.  Musculoskeletal: Positive for arthralgias and gait problem.  Neurological: Positive for dizziness and headaches.       Objective:   Physical Exam  Constitutional: He appears well-developed and well-nourished. He appears distressed.  Well-nourished, well-developed, overweight elderly male with an abnormal gait/limping and patient has some discomfort with walking as well as when attempting to get onto the exam table  HENT:  Head: Normocephalic and  atraumatic.  Nose: Nose normal.  Mouth/Throat: Oropharynx is clear and moist.  Normal tympanic membranes bilaterally  Eyes: Pupils are equal, round, and reactive to light. Conjunctivae and EOM are normal.  Neck: Normal range of motion. Neck supple. No thyromegaly present.  Cardiovascular: Normal rate and regular rhythm.  Pulmonary/Chest: Effort normal and breath sounds normal.  Abdominal: Soft. There is no tenderness.  Presence of truncal obesity  Musculoskeletal: He exhibits tenderness. He exhibits no edema.  Patient with discomfort with palpation over the left lateral hip.  Patient  also with some discomfort to palpation at the left medial malleolus of the ankle.  Patient does have some swelling and deformity of the hands/fingers at the MCP joints as well as PIP joints.     BP (!) 177/86 (BP Location: Left Arm, Patient Position: Sitting, Cuff Size: Normal)   Pulse 74   Temp 98.2 F (36.8 C) (Oral)   Resp 18   Ht 5\' 1"  (1.549 m)   Wt 185 lb (83.9 kg)   SpO2 95%   BMI 34.96 kg/m     Assessment & Plan:  1. Uncontrolled hypertension Patient's blood pressure is uncontrolled at today's visit as he has been out of his medication.  Patient is provided with a new prescription for refill of lisinopril.  Patient is encouraged to return to clinic in the next 1 to 2 weeks to have blood pressure rechecked by nurse or clinical pharmacist.  Patient should also schedule follow-up if his mild headache and mild dizziness did not resolve in a few days after restarting his blood pressure medication. - lisinopril (PRINIVIL,ZESTRIL) 20 MG tablet; Take 1 tablet (20 mg total) by mouth daily.  Dispense: 30 tablet; Refill: 6  2. Chronic hip pain, left Patient with chronic left hip pain.  On review of chart, patient has had x-ray of the left hip done in 2016 which did show significant degenerative joint disease.  Patient will be referred to orthopedics for further evaluation and treatment and he was also provided with short-term refill of meloxicam to help with pain. - Ambulatory referral to Orthopedics     An After Visit Summary was printed and given to the patient.  Return in about 4 months (around 12/09/2017) for 1-2 weeks with CPP-BP check; 4 months with PCP.

## 2017-08-16 ENCOUNTER — Encounter: Payer: Medicare Other | Admitting: Pharmacist

## 2017-08-18 ENCOUNTER — Encounter: Payer: Self-pay | Admitting: Pharmacist

## 2017-08-18 ENCOUNTER — Ambulatory Visit: Payer: Medicare Other | Attending: Family Medicine | Admitting: Pharmacist

## 2017-08-18 VITALS — BP 139/75 | HR 77

## 2017-08-18 DIAGNOSIS — I10 Essential (primary) hypertension: Secondary | ICD-10-CM

## 2017-08-18 NOTE — Progress Notes (Signed)
   S:    Patient arrives in good spirits. He is a 77 YO male with a PMH significant for HTN who presents to the clinic for a BP check. He was last seen by Dr. Chapman Fitch on 08/09/17. At that visit, lisinopril was re-started and amlodipine initiated.   Today, patient reports adherence with medications. Denies CP or SOB. Patient does endorse "small headache" and dizziness that he attributes to hot weather or changing body position.   Current BP Medications include:   - amlodipine 5 mg daily - lisinopril 20 mg daily  Home BP readings:  - reported as "good"   O:  L arm: 139/75, HR 77 Last 3 Office BP readings: BP Readings from Last 3 Encounters:  08/09/17 (!) 177/86  08/04/16 (!) 156/80  03/27/15 189/64    BMET    Component Value Date/Time   NA 140 08/04/2016 1100   K 5.1 08/04/2016 1100   CL 101 08/04/2016 1100   CO2 23 08/04/2016 1100   GLUCOSE 90 08/04/2016 1100   GLUCOSE 90 05/15/2014 1052   BUN 14 08/04/2016 1100   CREATININE 1.04 08/04/2016 1100   CREATININE 0.75 05/15/2014 1052   CALCIUM 9.6 08/04/2016 1100   GFRNONAA 69 08/04/2016 1100   GFRNONAA >89 05/15/2014 1052   GFRAA 80 08/04/2016 1100   GFRAA >89 05/15/2014 1052    Renal function: CrCl cannot be calculated (Patient's most recent lab result is older than the maximum 21 days allowed.).  A/P: Hypertension longstanding currently uncontrolled on current medications. BP Goal <130/80 mmHg. Patient is adherent with current medications. Of note, he re-established care with [facility] last week after not being seen in ~1 year. He reported to Dr. Chapman Fitch at that encounter that he had been without his lisinopril. Dr. Chapman Fitch restarted lisinopril and initiated amlodipine pressure looks to be improving. Of note, he reports that his son checked it at home today and was good.   Of note, he is complaining of a small headache and some dizziness. This may be because his pressure is improving after being non-controlled. Additionally, HA may be  attributed to the aforementioned hot weather/possible dehydration. Pt denies other symptoms as noted above. Pt was expressive that he felt well otherwise. Recommended increase intake of water and OTC Tylenol as needed for HA. I did emphasize that if his HA does not improve with a couple of days of Tylenol to schedule an appointment.  -Continued anti-hypertensives.  -Counseled on lifestyle modifications for blood pressure control including reduced dietary sodium, increased exercise, adequate sleep  Results reviewed and written information provided. Total time in face-to-face counseling 15 minutes.   F/U Clinic Visit in 12/2017.    Patient seen with:  Marylene Buerger, PharmD Candidate [employer] of 2021  Benard Halsted, PharmD, [place] [phone]

## 2017-10-12 MED FILL — MELOXICAM 15 MG TABLET: 15 | 30 days supply | Qty: 30 | Fill #0

## 2017-12-12 MED FILL — MELOXICAM 15 MG TABLET: 15 | 30 days supply | Qty: 30 | Fill #2

## 2017-12-12 MED FILL — MELOXICAM 15 MG TABLET: 15 | 30 days supply | Qty: 30 | Fill #1

## 2017-12-19 ENCOUNTER — Ambulatory Visit: Payer: Medicare Other | Admitting: Family Medicine

## 2018-03-09 MED FILL — MELOXICAM 15 MG TABLET: 15 | 30 days supply | Qty: 30 | Fill #3

## 2018-04-28 ENCOUNTER — Other Ambulatory Visit: Payer: Self-pay | Admitting: Family Medicine

## 2018-04-28 MED FILL — MELOXICAM 15 MG TABLET: 15 | 30 days supply | Qty: 30 | Fill #0

## 2018-05-31 MED FILL — MELOXICAM 15 MG TABLET: 15 | 30 days supply | Qty: 30 | Fill #1

## 2018-06-30 ENCOUNTER — Telehealth: Payer: Self-pay

## 2018-09-04 ENCOUNTER — Other Ambulatory Visit: Payer: Self-pay | Admitting: Family Medicine

## 2018-09-29 ENCOUNTER — Ambulatory Visit: Payer: Medicare Other | Attending: Family Medicine | Admitting: Family Medicine

## 2018-09-29 ENCOUNTER — Other Ambulatory Visit: Payer: Self-pay

## 2019-03-10 ENCOUNTER — Ambulatory Visit: Payer: Medicare Other

## 2019-04-25 ENCOUNTER — Other Ambulatory Visit: Payer: Self-pay

## 2019-04-25 ENCOUNTER — Ambulatory Visit: Payer: Medicare Other | Attending: Family Medicine | Admitting: Physician Assistant

## 2019-04-25 VITALS — BP 176/78 | HR 65 | Temp 97.9°F | Ht 61.0 in | Wt 185.0 lb

## 2019-04-25 DIAGNOSIS — M79605 Pain in left leg: Secondary | ICD-10-CM | POA: Insufficient documentation

## 2019-04-25 DIAGNOSIS — R42 Dizziness and giddiness: Secondary | ICD-10-CM | POA: Diagnosis present

## 2019-04-25 DIAGNOSIS — I4510 Unspecified right bundle-branch block: Secondary | ICD-10-CM | POA: Diagnosis not present

## 2019-04-25 DIAGNOSIS — Z131 Encounter for screening for diabetes mellitus: Secondary | ICD-10-CM

## 2019-04-25 DIAGNOSIS — I10 Essential (primary) hypertension: Secondary | ICD-10-CM

## 2019-04-25 DIAGNOSIS — E559 Vitamin D deficiency, unspecified: Secondary | ICD-10-CM | POA: Diagnosis not present

## 2019-04-25 DIAGNOSIS — M1612 Unilateral primary osteoarthritis, left hip: Secondary | ICD-10-CM

## 2019-04-25 DIAGNOSIS — Z79899 Other long term (current) drug therapy: Secondary | ICD-10-CM | POA: Diagnosis not present

## 2019-04-25 DIAGNOSIS — Z789 Other specified health status: Secondary | ICD-10-CM

## 2019-04-25 LAB — GLUCOSE, POCT (MANUAL RESULT ENTRY): POC Glucose: 97 mg/dl (ref 70–99)

## 2019-04-25 MED ORDER — AMLODIPINE BESYLATE 5 MG PO TABS: 5.0000 mg | ORAL_TABLET | Freq: Every day | ORAL | 6 refills | Status: DC

## 2019-04-25 MED ORDER — MELOXICAM 15 MG PO TABS: ORAL_TABLET | ORAL | 2 refills | Status: DC

## 2019-04-25 MED FILL — AMLODIPINE BESYLATE 5 MG TA: 5 | 30 days supply | Qty: 30 | Fill #0

## 2019-04-25 MED FILL — MELOXICAM 15 MG TABLET: 15 | 30 days supply | Qty: 30 | Fill #0

## 2019-04-25 NOTE — Progress Notes (Signed)
Pain on legs  Need meds for bones  Wants to talk about changing his Losartan to something else per pt it is not working  Per pt when he wakes up and get up in the morning everyday he feels dizzy

## 2019-04-25 NOTE — Progress Notes (Signed)
Patient ID: Patrick Ortega, male   DOB: [birth year], 79 y.o.   MRN: [id number]   Patrick Ortega, is a 79 y.o. male  [id number]  [id number]  DOB - 10-Aug-1940  Subjective:  Chief Complaint and HPI: Patrick Ortega is a 79 y.o. male here today saying he has had dizziness for 3 months since starting losartan 25mg  for BP.  He has not been taking amlodipine or lisinopril.  He denies HA/CP.  Dizziness occurs first thing in the morning and is better throughout the day.  He occasionally feels as though he has an irregular beat in his heart.  He denies SOB.  He also has leg pain/L hip pain and is out of meloxicam and needs a RF.  No labs in ~107yrs.    Cecilia interpreting ROS:   Constitutional:  No f/c, No night sweats, No unexplained weight loss. EENT:  No vision changes, No blurry vision, No hearing changes. No mouth, throat, or ear problems.  Respiratory: No cough, No SOB Cardiac: No CP, no palpitations GI:  No abd pain, No N/V/D. GU: No Urinary s/sx Musculoskeletal: see above Neuro: No headache, + dizziness, no motor weakness.  Skin: No rash Endocrine:  No polydipsia. No polyuria.  Psych: Denies SI/HI  No problems updated.  ALLERGIES: No Known Allergies  PAST MEDICAL HISTORY: Past Medical History:  Diagnosis Date  . GERD (gastroesophageal reflux disease)   . Hypertension     MEDICATIONS AT HOME: Prior to Admission medications   Medication Sig Start Date End Date Taking? Authorizing Provider  amLODipine (NORVASC) 5 MG tablet Take 1 tablet (5 mg total) by mouth daily. 04/25/19   Argentina Donovan, PA-C  meloxicam (MOBIC) 15 MG tablet TAKE 1 TABLET BY MOUTH ONCE DAILY AS NEEDED FOR PAIN 04/25/19   Argentina Donovan, PA-C  methotrexate (RHEUMATREX) 7.5 MG tablet TAKE ONE TABLET BY MOUTH ONCE A WEEK. 08/12/16   Fredia Beets R, FNP     Objective:  EXAM:   Vitals:   04/25/19 1515  BP: (!) 176/78  Pulse: 65  Temp: 97.9 F (36.6 C)  TempSrc: Oral  SpO2: 96%  Weight:  185 lb (83.9 kg)  Height: 5\' 1"  (1.549 m)    General appearance : A&OX3. NAD. Non-toxic-appearing HEENT: Atraumatic and Normocephalic.  PERRLA. EOM intact.  Neck: supple, no JVD. No cervical lymphadenopathy. No thyromegaly Chest/Lungs:  Breathing-non-labored, Good air entry bilaterally, breath sounds normal without rales, rhonchi, or wheezing  CVS: S1 S2 regular, no murmurs, gallops, rubs  Extremities: Bilateral Lower Ext shows no edema, both legs are warm to touch with = pulse throughout Neurology:  CN II-XII grossly intact, Non focal.   Psych:  TP linear. J/I fair. Poor historian Normal speech. Appropriate eye contact and affect.  Skin:  No Rash  Data Review No results found for: HGBA1C   Assessment & Plan   1. Screening for diabetes mellitus I have had a lengthy discussion and provided education about insulin resistance and the intake of too much sugar/refined carbohydrates.  I have advised the patient to work at a goal of eliminating sugary drinks, candy, desserts, sweets, refined sugars, processed foods, and white carbohydrates.  The patient expresses understanding.  - Glucose (CBG)  2. Dizziness No ST changes/acute changes on EKG.  I do not habe an old EKG but will refer due to RBBB - EKG 12-Lead - Comprehensive metabolic panel - CBC with Differential/Platelet - TSH - Ambulatory referral to Cardiology  3. Uncontrolled hypertension Stop losartan due to dizziness -  EKG 12-Lead - Comprehensive metabolic panel - CBC with Differential/Platelet - TSH - Ambulatory referral to Cardiology - amLODipine (NORVASC) 5 MG tablet; Take 1 tablet (5 mg total) by mouth daily.  Dispense: 30 tablet; Refill: 6  4. Right bundle branch block ? new - Ambulatory referral to Cardiology  5. Language barrier stratus interpreters used and additional time performing visit was required.   6. Osteoarthritis of left hip, unspecified osteoarthritis type - Vitamin D, 25-hydroxy - meloxicam  (MOBIC) 15 MG tablet; TAKE 1 TABLET BY MOUTH ONCE DAILY AS NEEDED FOR PAIN  Dispense: 30 tablet; Refill: 2  Patient have been counseled extensively about nutrition and exercise  Return in about 6 weeks (around 06/06/2019) for with PCP;  recheck BP/dizziness/leg pain.  The patient was given clear instructions to go to ER or return to medical center if symptoms don't improve, worsen or new problems develop. The patient verbalized understanding. The patient was told to call to get lab results if they haven't heard anything in the next week.     Freeman Caldron, PA-C [facility] [place], [place]   04/25/2019, 3:55 PM

## 2019-04-26 ENCOUNTER — Other Ambulatory Visit: Payer: Self-pay | Admitting: Physician Assistant

## 2019-04-26 DIAGNOSIS — E559 Vitamin D deficiency, unspecified: Secondary | ICD-10-CM

## 2019-04-26 LAB — CBC WITH DIFFERENTIAL/PLATELET
Basophils Absolute: 0.1 10*3/uL (ref 0.0–0.2)
Basos: 1 %
EOS (ABSOLUTE): 0.1 10*3/uL (ref 0.0–0.4)
Eos: 1 %
Hematocrit: 41.3 % (ref 37.5–51.0)
Hemoglobin: 14.2 g/dL (ref 13.0–17.7)
Immature Grans (Abs): 0 10*3/uL (ref 0.0–0.1)
Immature Granulocytes: 0 %
Lymphocytes Absolute: 5.2 10*3/uL — ABNORMAL HIGH (ref 0.7–3.1)
Lymphs: 39 %
MCH: 29.5 pg (ref 26.6–33.0)
MCHC: 34.4 g/dL (ref 31.5–35.7)
MCV: 86 fL (ref 79–97)
Monocytes Absolute: 0.9 10*3/uL (ref 0.1–0.9)
Monocytes: 7 %
Neutrophils Absolute: 6.9 10*3/uL (ref 1.4–7.0)
Neutrophils: 52 %
Platelets: 387 10*3/uL (ref 150–450)
RBC: 4.81 x10E6/uL (ref 4.14–5.80)
RDW: 13.2 % (ref 11.6–15.4)
WBC: 13.3 10*3/uL — ABNORMAL HIGH (ref 3.4–10.8)

## 2019-04-26 LAB — COMPREHENSIVE METABOLIC PANEL
ALT: 15 IU/L (ref 0–44)
AST: 25 IU/L (ref 0–40)
Albumin/Globulin Ratio: 1.7 (ref 1.2–2.2)
Albumin: 4.8 g/dL — ABNORMAL HIGH (ref 3.7–4.7)
Alkaline Phosphatase: 103 IU/L (ref 39–117)
BUN/Creatinine Ratio: 16 (ref 10–24)
BUN: 16 mg/dL (ref 8–27)
Bilirubin Total: 0.5 mg/dL (ref 0.0–1.2)
CO2: 25 mmol/L (ref 20–29)
Calcium: 10.1 mg/dL (ref 8.6–10.2)
Chloride: 100 mmol/L (ref 96–106)
Creatinine, Ser: 1.01 mg/dL (ref 0.76–1.27)
GFR calc Af Amer: 81 mL/min/{1.73_m2} (ref >59)
GFR calc non Af Amer: 70 mL/min/{1.73_m2} (ref >59)
Globulin, Total: 2.9 g/dL (ref 1.5–4.5)
Glucose: 86 mg/dL (ref 65–99)
Potassium: 4.9 mmol/L (ref 3.5–5.2)
Sodium: 138 mmol/L (ref 134–144)
Total Protein: 7.7 g/dL (ref 6.0–8.5)

## 2019-04-26 LAB — VITAMIN D 25 HYDROXY (VIT D DEFICIENCY, FRACTURES): Vit D, 25-Hydroxy: 18.6 ng/mL — ABNORMAL LOW (ref 30.0–100.0)

## 2019-04-26 LAB — TSH: TSH: 2.08 u[IU]/mL (ref 0.450–4.500)

## 2019-04-26 MED ORDER — VITAMIN D (ERGOCALCIFEROL) 1.25 MG (50000 UNIT) PO CAPS: 50000.0000 [IU] | ORAL_CAPSULE | ORAL | 0 refills | Status: DC

## 2019-04-26 MED FILL — VIT D2 1.25 MG (50,000 UNIT: 1.25 MG | 28 days supply | Qty: 4 | Fill #0

## 2019-05-09 ENCOUNTER — Other Ambulatory Visit: Payer: Self-pay

## 2019-05-09 ENCOUNTER — Ambulatory Visit: Payer: Medicare Other | Attending: Family Medicine | Admitting: Family Medicine

## 2019-05-09 ENCOUNTER — Encounter: Payer: Self-pay | Admitting: Family Medicine

## 2019-05-09 VITALS — BP 174/74 | HR 63 | Temp 97.7°F | Ht 61.0 in | Wt 167.6 lb

## 2019-05-09 DIAGNOSIS — Z758 Other problems related to medical facilities and other health care: Secondary | ICD-10-CM

## 2019-05-09 DIAGNOSIS — I10 Essential (primary) hypertension: Secondary | ICD-10-CM

## 2019-05-09 DIAGNOSIS — M1612 Unilateral primary osteoarthritis, left hip: Secondary | ICD-10-CM | POA: Diagnosis not present

## 2019-05-09 DIAGNOSIS — M25511 Pain in right shoulder: Secondary | ICD-10-CM

## 2019-05-09 DIAGNOSIS — M545 Low back pain, unspecified: Secondary | ICD-10-CM

## 2019-05-09 DIAGNOSIS — I4510 Unspecified right bundle-branch block: Secondary | ICD-10-CM | POA: Diagnosis not present

## 2019-05-09 DIAGNOSIS — G8929 Other chronic pain: Secondary | ICD-10-CM

## 2019-05-09 DIAGNOSIS — R079 Chest pain, unspecified: Secondary | ICD-10-CM

## 2019-05-09 DIAGNOSIS — R42 Dizziness and giddiness: Secondary | ICD-10-CM

## 2019-05-09 DIAGNOSIS — R269 Unspecified abnormalities of gait and mobility: Secondary | ICD-10-CM

## 2019-05-09 DIAGNOSIS — Z79899 Other long term (current) drug therapy: Secondary | ICD-10-CM | POA: Diagnosis not present

## 2019-05-09 DIAGNOSIS — Z87891 Personal history of nicotine dependence: Secondary | ICD-10-CM | POA: Insufficient documentation

## 2019-05-09 DIAGNOSIS — D72829 Elevated white blood cell count, unspecified: Secondary | ICD-10-CM | POA: Diagnosis not present

## 2019-05-09 DIAGNOSIS — M160 Bilateral primary osteoarthritis of hip: Secondary | ICD-10-CM | POA: Diagnosis not present

## 2019-05-09 DIAGNOSIS — Z603 Acculturation difficulty: Secondary | ICD-10-CM

## 2019-05-09 DIAGNOSIS — M25552 Pain in left hip: Secondary | ICD-10-CM | POA: Diagnosis not present

## 2019-05-09 DIAGNOSIS — Z789 Other specified health status: Secondary | ICD-10-CM | POA: Diagnosis not present

## 2019-05-09 LAB — POCT URINALYSIS DIP (CLINITEK)
Bilirubin, UA: NEGATIVE
Glucose, UA: NEGATIVE mg/dL
Ketones, POC UA: NEGATIVE mg/dL
Leukocytes, UA: NEGATIVE
Nitrite, UA: NEGATIVE
POC PROTEIN,UA: NEGATIVE
Spec Grav, UA: 1.02
Urobilinogen, UA: 0.2 U/dL
pH, UA: 6

## 2019-05-09 LAB — GLUCOSE, POCT (MANUAL RESULT ENTRY): POC Glucose: 102 mg/dL — AB (ref 70–99)

## 2019-05-09 MED ORDER — MECLIZINE HCL 12.5 MG PO TABS: 12.5000 mg | ORAL_TABLET | Freq: Three times a day (TID) | ORAL | 1 refills | Status: DC | PRN

## 2019-05-09 MED ORDER — TRIAMTERENE-HCTZ 37.5-25 MG PO TABS: 1.0000 | ORAL_TABLET | Freq: Every day | ORAL | 1 refills | Status: DC

## 2019-05-09 MED FILL — ?ERGOCALCIFEROL 50000 UNITC: 1.25 MG | 28 days supply | Qty: 4 | Fill #0

## 2019-05-09 MED FILL — ?TRIAMTERENE/HCTZ 37.5/25TB: 37.5-25 | 30 days supply | Qty: 30 | Fill #0

## 2019-05-09 MED FILL — ?MECLIZINE 12.5 MG TABL: 12.5 | 10 days supply | Qty: 30 | Fill #0

## 2019-05-09 NOTE — Progress Notes (Signed)
Dizzy and discomfort on left side of chest pain, tingling in the back on the left side do not have any tingling in arms been going on for the past 2 months now  Wants to change the BP meds, per pt this med is causing him to feel dizzy and want to go on med for dizziness  cbg today 105

## 2019-05-09 NOTE — Patient Instructions (Signed)
Mareos Dizziness Los mareos son un problema muy frecuente. Causan sensacin de inestabilidad o de desvanecimiento. Puede sentir que se va a desmayar. Los [employer] pueden provocarle una lesin si se tropieza o se cae. La causa puede deberse a [employer], tales como los siguientes:  Medicamentos.  No tener suficiente agua en el cuerpo (deshidratacin).  Enfermedad. Siga estas indicaciones en su casa: Comida y bebida   Beba suficiente lquido para mantener el pis (orina) claro o de color amarillo plido. Esto evita la deshidratacin. Trate de beber ms lquidos transparentes, como agua.  No beba alcohol.  Limite la cantidad de cafena que bebe o come si el mdico se lo indica.  Limite la cantidad de sal (sodio) que bebe o come si el mdico se lo indica. Actividad   Evite los movimientos rpidos. ? Cuando se levante de una silla, sujtese hasta sentirse bien. ? Por la maana, sintese primero a un lado de la cama. Cuando se sienta bien, pngase lentamente de pie mientras se sostiene de algo. Haga esto hasta que se sienta seguro en cuanto al equilibrio.  Mueva las piernas con frecuencia si debe estar de pie en un lugar durante mucho tiempo. Mientras est de pie, contraiga y relaje los msculos de las piernas.  No conduzca vehculos ni opere maquinaria pesada si se siente mareado.  Evite agacharse si se siente mareado. En su casa, coloque los objetos en algn lugar que le resulte fcil alcanzarlos sin agacharse. Estilo de vida  No consuma ningn producto que contenga nicotina o tabaco, como cigarrillos y Psychologist, sport and exercise. Si necesita ayuda para dejar de fumar, consulte al mdico.  Intente bajar el nivel de estrs. Para hacerlo, puede usar mtodos como el yoga o la meditacin. Hable con el mdico si necesita ayuda. Instrucciones generales  Controle sus mareos para ver si hay cambios.  Tome los medicamentos de venta libre y los recetados solamente como se lo haya indicado  el mdico. Hable con el mdico si cree que la causa de sus mareos es algn medicamento que est tomando.  Infrmele a un amigo o a un familiar si se siente mareado. Pdale a esta persona que llame al mdico si observa cambios en su comportamiento.  Concurra a todas las visitas de control como se lo haya indicado el mdico. Esto es importante. Comunquese con un mdico si:  Los [employer].  Los [employer] o la sensacin de Engineer, petroleum.  Siente malestar estomacal (nuseas).  Tiene problemas para escuchar.  Aparecen nuevos sntomas.  Siente inestabilidad al estar de pie.  Siente que la Development worker, international aid vueltas. Solicite ayuda de inmediato si:  Vomita o tiene heces acuosas (diarrea), y no puede comer o beber nada.  Tiene dificultad para hacer lo siguiente: ? Hablar. ? Caminar. ? Tragar. ? Usar los brazos, las [place] piernas.  Se siente constantemente dbil.  No piensa con claridad o tiene dificultad para armar oraciones. Es posible que un amigo o un familiar adviertan que esto ocurre.  Tiene los siguientes sntomas: ? Tourist information centre manager. ? Dolor en el vientre (abdomen). ? Falta de aire. ? Sudoracin.  Cambios en la visin.  Sangrado.  Dolor de cabeza muy intenso.  Dolor o rigidez en el cuello.  Cristy Hilts. Estos sntomas pueden Sales executive. No espere hasta que los sntomas desaparezcan. Solicite atencin mdica de inmediato. Comunquese con el servicio de emergencias de su localidad (911 en los [place]). No conduzca por sus propios [place]  mareos causan sensacin de inestabilidad o de desvanecimiento. Puede sentir que se va a desmayar.  Beba suficiente lquido para mantener el pis (orina) claro o de color amarillo plido. No beba alcohol.  Evite los movimientos rpidos si se siente mareado.  Controle sus mareos para ver si hay cambios. Esta informacin no tiene Marine scientist el consejo del  mdico. Asegrese de hacerle al mdico cualquier pregunta que tenga. Document Revised: 06/24/2016 Document Reviewed: 06/24/2016 Elsevier Patient Education  [place].  Hipertensin en los adultos Hypertension, Adult El trmino hipertensin es otra forma de denominar a la presin arterial elevada. La presin arterial elevada fuerza al corazn a trabajar ms para bombear la sangre. Esto puede causar problemas con el paso del [place]. Una lectura de presin arterial est compuesta por 2 nmeros. Hay un nmero superior (sistlico) sobre un nmero inferior (diastlico). Lo ideal es tener la presin arterial por debajo de 120/80. Las elecciones saludables pueden ayudar a Engineer, materials presin arterial, o tal vez necesite medicamentos para bajarla. Cules son las causas? Se desconoce la causa de esta afeccin. Algunas afecciones pueden estar relacionadas con la presin arterial alta. Qu incrementa el riesgo?  Fumar.  Tener diabetes mellitus tipo 2, colesterol alto, o ambos.  No hacer la cantidad suficiente de actividad fsica o ejercicio.  Tener sobrepeso.  Consumir mucha grasa, azcar, caloras o sal (sodio) en su dieta.  Beber alcohol en exceso.  Tener una enfermedad renal a largo plazo (crnica).  Tener antecedentes familiares de presin arterial alta.  Edad. Los riesgos aumentan con la edad.  [place]aza. El riesgo es mayor para las Retail banker.  Sexo. Antes de los 45aos, los hombres corren ms [employer]. Despus de los 65aos, las mujeres corren ms [employer].  Tener apnea obstructiva del sueo.  Estrs. Cules son los signos o los sntomas?  Es posible que la presin arterial alta puede no cause sntomas. La presin arterial muy alta (crisis hipertensiva) puede provocar: ? Dolor de [place]. ? Sensaciones de preocupacin o nerviosismo (ansiedad). ? Falta de aire. ? Hemorragia nasal. ? Sensacin de Engineer, site  (nuseas). ? Vmitos. ? Cambios en la forma de ver. ? Dolor muy intenso en el pecho. ? Convulsiones. Cmo se trata?  Esta afeccin se trata haciendo cambios saludables en el estilo de vida, por ejemplo: ? Consumir alimentos saludables. ? Hacer ms ejercicio. ? Beber menos alcohol.  El mdico puede recetarle medicamentos si los cambios en el estilo de vida no son suficientes para Child psychotherapist la presin arterial y si: ? El nmero de arriba est por encima de 130. ? El nmero de abajo est por encima de 80.  Su presin arterial personal ideal puede variar. Siga estas instrucciones en su casa: Comida y bebida   Si se lo dicen, siga el plan de alimentacin de DASH (Dietary Approaches to Stop Hypertension, Maneras de alimentarse para detener la hipertensin). Para seguir este plan: ? Llene la mitad del plato de cada comida con frutas y verduras. ? Llene un cuarto del plato de cada comida con cereales integrales. Los cereales integrales incluyen pasta integral, arroz integral y pan integral. ? Coma y beba productos lcteos con bajo contenido de grasa, como leche descremada o yogur bajo en grasas. ? Llene un cuarto del plato de cada comida con protenas bajas en grasa (magras). Las protenas bajas en grasa incluyen pescado, pollo sin piel, huevos, frijoles y tofu. ? Evite consumir carne grasa, carne curada y procesada,  o pollo con piel. ? Evite consumir alimentos prehechos o procesados.  Consuma menos de 1500 mg de sal por da.  No beba alcohol si: ? El mdico le indica que no lo haga. ? Est embarazada, puede estar embarazada o est tratando de quedar embarazada.  Si bebe alcohol: ? Limite la cantidad que bebe a lo siguiente:  De 0 a 1 medida por da para las mujeres.  De 0 a 2 medidas por da para los hombres. ? Est atento a la cantidad de alcohol que hay en las bebidas que toma. En los [place], una medida equivale a una botella de cerveza de 12oz (380ml), un vaso  de vino de 5oz (157ml) o un vaso de una bebida alcohlica de alta graduacin de 1oz (57ml). Estilo de vida   Trabaje con su mdico para mantenerse en un peso saludable o para perder peso. Pregntele a su mdico cul es el peso recomendable para usted.  Haga al menos 75minutos de ejercicio la [employer] de la [place]. Estos pueden incluir caminar, nadar o andar en bicicleta.  Realice al menos 30 minutos de ejercicio que fortalezca sus msculos (ejercicios de resistencia) al menos 3 das a la [place]. Estos pueden incluir levantar pesas o hacer [place].  No consuma ningn producto que contenga nicotina o tabaco, como cigarrillos, cigarrillos electrnicos y tabaco de Higher education careers adviser. Si necesita ayuda para dejar de fumar, consulte al [employer].  Controle su presin arterial en su casa tal como le indic el mdico.  Concurra a todas las visitas de seguimiento como se lo haya indicado el mdico. Esto es importante. Medicamentos  [employer] de venta libre y los recetados solamente como se lo haya indicado el mdico. Siga cuidadosamente las indicaciones.  No omita las dosis de medicamentos para la presin arterial. Los medicamentos pierden eficacia si omite dosis. El hecho de omitir las dosis tambin [place] el riesgo de otros problemas.  Pregntele a su mdico a qu efectos secundarios o reacciones a los Careers information officer. Comunquese con un mdico si:  Piensa que tiene [place] reaccin a los medicamentos que est tomando.  Tiene dolores de cabeza frecuentes (recurrentes).  Se siente mareado.  Tiene hinchazn en los tobillos.  Tiene problemas de visin. Solicite ayuda inmediatamente si:  Siente un dolor de cabeza muy intenso.  Empieza a sentirse desorientado (confundido).  Se siente dbil o adormecido.  Siente que va a desmayarse.  Tiene un dolor muy intenso en las siguientes zonas: ? Pecho. ? Vientre (abdomen).  Vomita ms de una vez.  Tiene  dificultad para respirar. Resumen  El trmino hipertensin es otra forma de denominar a la presin arterial elevada.  La presin arterial elevada fuerza al corazn a trabajar ms para bombear la sangre.  Para la [employer], una presin arterial normal es menor que 120/80.  Las decisiones saludables pueden ayudarle a disminuir su presin arterial. Si no puede bajar su presin arterial mediante decisiones saludables, es posible que deba tomar medicamentos. Esta informacin no tiene Marine scientist el consejo del mdico. Asegrese de hacerle al mdico cualquier pregunta que tenga. Document Revised: 10/06/2017 Document Reviewed: 10/06/2017 Elsevier Patient Education  [place].

## 2019-05-09 NOTE — Progress Notes (Signed)
Established Patient Office Visit  Subjective:  Patient ID: Patrick Ortega, male    DOB: [birth year]  Age: 79 y.o. MRN: [id number]  CC: No chief complaint on file.   HPI Patrick Ortega, 79 year old Hispanic male with primary language of Spanish, last seen in the office on 04/25/2018 by another provider due to the complaint of dizziness for about 3 months since starting losartan 25 mg for blood pressure.  His blood pressure at that visit was elevated at 176/78.  He had not been taking any prescribed blood pressure medication.  Patient had EKG in the office showing possible new right bundle branch block and patient was referred to cardiology in follow-up of his abnormal EKG, uncontrolled hypertension.  Prescription was provided for amlodipine 5 mg daily.  Patient was additionally seen for osteoarthritis of the right hip and was prescribed meloxicam 15 mg.  Patient also had blood work with normal CMP, CBC with elevated white blood cell count of 13.3, normal TSH and vitamin D level of 18.6.  Per lab note, prescription was sent to his pharmacy for vitamin D replacement therapy.  He returns today in follow-up.         Patient with complaint of continued issues with left-sided chest pain.  He reports that the chest pain is very minimal.  He had difficulty describing the pain but feels as if the pain is dull and not sharp.  Pain lasts for about 5 minutes.  There is nothing that he can do to make the pain any better or worse.  Pain usually occurs when he is at rest/lying down.  He does not have any nausea or radiation of pain to the neck/jaw, back or arms.  He does have recurrent issues with right shoulder pain and shoulder pain is worse if he is holding objects in his right arm or attempts to lift his right arm overhead.  He does not have any sweating when he has onset of chest pain.  He denies issues with shortness of breath or cough.  He is taking his blood pressure medication daily.  He denies headaches related  to his blood pressure but continues to have issues with chronic dizziness.  Dizziness occurs about 30 minutes after he takes amlodipine for blood pressure.  He is usually sitting down when the dizziness occurs.  He has had no episodes of focal numbness or weakness.  No slurred speech.  No visual changes.  Dizziness resolves on its own.  He would like to have something to take for when the dizziness occurs.        He additionally has continued issues with left hip pain which causes him to be unable to walk normally.  He would like to be referred to someone to see if he can have surgery on his hip.  The meloxicam did not help with his hip pain and hip pain remains between a 9-10 on a 0-to-10 scale.  He also has pain that occurs in his lower back but does not radiate to either buttock or leg.  He denies any current issues with bladder or bowel dysfunction.  No urinary frequency or dysuria.  No fever or chills.  No nausea/vomiting/diarrhea or constipation, no abdominal pain.  He does have fatigue.        Past Medical History:  Diagnosis Date  . GERD (gastroesophageal reflux disease)   . Hypertension     History reviewed. No pertinent surgical history.  History reviewed. No pertinent family history.  Social History  Socioeconomic History  . Marital status: Married    Spouse name: Not on file  . Number of children: Not on file  . Years of education: Not on file  . Highest education level: Not on file  Occupational History  . Not on file  Tobacco Use  . Smoking status: Former Smoker    Packs/day: 0.25    Years: 0.50    Pack years: 0.12    Types: Cigarettes  . Smokeless tobacco: Never Used  Substance and Sexual Activity  . Alcohol use: Yes    Alcohol/week: 24.0 standard drinks    Types: 24 Cans of beer per week  . Drug use: Not on file  . Sexual activity: Yes  Other Topics Concern  . Not on file  Social History Narrative  . Not on file   Social Determinants of Health   Financial  Resource Strain:   . Difficulty of Paying Living Expenses:   Food Insecurity:   . Worried About Charity fundraiser in the Last Year:   . Arboriculturist in the Last Year:   Transportation Needs:   . Film/video editor (Medical):   Marland Kitchen Lack of Transportation (Non-Medical):   Physical Activity:   . Days of Exercise per Week:   . Minutes of Exercise per Session:   Stress:   . Feeling of Stress :   Social Connections:   . Frequency of Communication with Friends and Family:   . Frequency of Social Gatherings with Friends and Family:   . Attends Religious Services:   . Active Member of Clubs or Organizations:   . Attends Archivist Meetings:   Marland Kitchen Marital Status:   Intimate Partner Violence:   . Fear of Current or Ex-Partner:   . Emotionally Abused:   Marland Kitchen Physically Abused:   . Sexually Abused:     Outpatient Medications Prior to Visit  Medication Sig Dispense Refill  . amLODipine (NORVASC) 5 MG tablet Take 1 tablet (5 mg total) by mouth daily. 30 tablet 6  . meloxicam (MOBIC) 15 MG tablet TAKE 1 TABLET BY MOUTH ONCE DAILY AS NEEDED FOR PAIN 30 tablet 2  . Vitamin D, Ergocalciferol, (DRISDOL) 1.25 MG (50000 UNIT) CAPS capsule Take 1 capsule (50,000 Units total) by mouth every 7 (seven) days. 16 capsule 0  . methotrexate (RHEUMATREX) 7.5 MG tablet TAKE ONE TABLET BY MOUTH ONCE A WEEK. (Patient not taking: Reported on 05/09/2019) 4 tablet 1   No facility-administered medications prior to visit.    No Known Allergies  ROS Review of Systems    Objective:    Physical Exam  Constitutional: He is oriented to person, place, and time. He appears well-developed and well-nourished.  Well-nourished well-developed overweight for height/obese older male in no acute distress.  Patient is wearing facemask as per office COVID-19 protocol  Neck: No JVD present.  Cardiovascular: Normal rate and regular rhythm.  Pulmonary/Chest: Effort normal and breath sounds normal.  Abdominal:  Soft. There is no abdominal tenderness. There is no rebound and no guarding.  Truncal obesity  Musculoskeletal:        General: Tenderness present. No edema.     Cervical back: Normal range of motion and neck supple.     Comments: Patient with abnormal gait when getting up from seated position and attempting to get onto the exam table.  Patient required some assistance.  Patient with lumbosacral tenderness to palpation as well as tenderness with palpation along the greater trochanter of the  left thigh and with attempt to flex the left thigh.  Patient had some difficulty understanding empty can maneuver but did have complaint of increased shoulder pain when he was finally able to do a near approximation of the maneuver and patient with complaint of pain when raising his right arm above his head  Lymphadenopathy:    He has no cervical adenopathy.  Neurological: He is alert and oriented to person, place, and time.  Skin: Skin is warm and dry.  Psychiatric: He has a normal mood and affect. His behavior is normal.  Nursing note and vitals reviewed.    BP (!) 174/74   Pulse 63   Temp 97.7 F (36.5 C) (Temporal)   Ht 5\' 1"  (1.549 m)   Wt 167 lb 9.6 oz (76 kg)   SpO2 98%   BMI 31.67 kg/m    Wt Readings from Last 3 Encounters:  05/09/19 167 lb 9.6 oz (76 kg)  04/25/19 185 lb (83.9 kg)  08/09/17 185 lb (83.9 kg)     Health Maintenance Due  Topic Date Due  . COVID-19 Vaccine (1) Never done  . TETANUS/TDAP  Never done  . PNA vac Low Risk Adult (1 of 2 - PCV13) Never done      Lab Results  Component Value Date   TSH 2.080 04/25/2019   Lab Results  Component Value Date   WBC 13.3 (H) 04/25/2019   HGB 14.2 04/25/2019   HCT 41.3 04/25/2019   MCV 86 04/25/2019   PLT 387 04/25/2019   Lab Results  Component Value Date   NA 138 04/25/2019   K 4.9 04/25/2019   CO2 25 04/25/2019   GLUCOSE 86 04/25/2019   BUN 16 04/25/2019   CREATININE 1.01 04/25/2019   BILITOT 0.5 04/25/2019    ALKPHOS 103 04/25/2019   AST 25 04/25/2019   ALT 15 04/25/2019   PROT 7.7 04/25/2019   ALBUMIN 4.8 (H) 04/25/2019   CALCIUM 10.1 04/25/2019   Lab Results  Component Value Date   CHOL 158 08/04/2016   Lab Results  Component Value Date   HDL 51 08/04/2016   Lab Results  Component Value Date   LDLCALC 78 08/04/2016   Lab Results  Component Value Date   TRIG 144 08/04/2016   Lab Results  Component Value Date   CHOLHDL 3.1 08/04/2016   No results found for: HGBA1C    Assessment & Plan:  1. Uncontrolled hypertension Patient's blood pressure remains uncontrolled on his current amlodipine 5 mg.  We will add triamterene HCTZ as patient also with mild bradycardia on EKG done at today's visit.  Patient will be referred to cardiology for further evaluation as he also reports issues with left-sided chest pain.  He will also have lipid panel at today's visit and as he also has complaint of recurrent dizziness, he will also be scheduled for vascular carotid ultrasound.  Information in Spanish regarding dizziness provided to the patient at today's visit as part of his after visit summary. - Lipid panel - Ambulatory referral to Cardiology - VAS US CAROTID; Future - triamterene-hydrochlorothiazide (MAXZIDE-25) 37.5-25 MG tablet; Take 1 tablet by mouth daily. To lower blood pressure  Dispense: 90 tablet; Refill: 1  2. Left-sided chest pain Patient with complaint of recurrent dull left-sided chest pain that last for about 5 minutes and usually occurs at night.  Patient had EKG with normal sinus rhythm and right bundle branch block.  He had already been referred at his last visit to cardiology  for similar findings on EKG.  He reports that he was never contacted regarding cardiology referral therefore new referral to cardiology was placed at today's visit.  Patient will also have lipid panel at today's visit. - EKG 12-Lead - Lipid panel - Ambulatory referral to Cardiology  3. Primary  osteoarthritis of left hip; gait abnormality Patient has prior imaging on review of chart osteoarthritis/degenerative joint disease of the left hip and patient with chronic issues with pain and difficulty with ambulation.  He will be referred to orthopedics for further evaluation and treatment.  Patient reports that he is interested in having a hip replacement. - Ambulatory referral to Orthopedic Surgery  4. Dizziness Patient reports continued issues with dizziness and he reports that he would like to have something to take to help with the dizziness.  Dizziness occurs about 30 minutes after he takes his amlodipine in the mornings.  Patient has been asked to take amlodipine just before bedtime to see if this helps with the sensation of dizziness.  He will also have complete blood count to look for anemia, basic metabolic panel to look for electrolyte abnormality and urinalysis to look for urinary tract infection.  He will additionally be scheduled for carotid Doppler ultrasound to make sure that he does not have any decreased blood flow/stenosis which may be contributing to his sensation of dizziness.  Prescription provided for meclizine 12.5 mg to take as needed for dizziness. - Glucose (CBG) - POCT URINALYSIS DIP (CLINITEK) - CBC with Differential - VAS US CAROTID; Future - meclizine (ANTIVERT) 12.5 MG tablet; Take 1 tablet (12.5 mg total) by mouth 3 (three) times daily as needed for dizziness.  Dispense: 30 tablet; Refill: 1 - Basic Metabolic Panel  5. Leukocytosis, unspecified type Patient with leukocytosis on recent CBC.  Will check urinalysis to look for urinary tract infection and CBC will be repeated to look for continued elevated white blood cell count. - POCT URINALYSIS DIP (CLINITEK) - CBC with Differential  6. Chronic left hip pain 7. Chronic right shoulder pain 8. Chronic midline low back pain without sciatica Patient is being referred to orthopedics in follow-up of his chronic left  hip pain, complaint of right shoulder pain with difficulty reaching overhead and chronic midline low back pain without radiation. - Ambulatory referral to Orthopedic Surgery  9. Language barrier Stratus video interpretation system used at today's visit to help with language barrier   An After Visit Summary was printed and given to the patient.   Follow-up: Return in about 4 weeks (around 06/06/2019) for CP/Dizziness/HTN- 1 or 2 BP check with Luke/CPP; ED if chest pain worsens.   Antony Blackbird, MD

## 2019-05-10 ENCOUNTER — Ambulatory Visit (HOSPITAL_COMMUNITY)
Admission: RE | Admit: 2019-05-10 | Discharge: 2019-05-10 | Disposition: A | Discharge status: Home / Self Care | Payer: Medicare Other | Source: Ambulatory Visit | Attending: Family Medicine | Admitting: Family Medicine

## 2019-05-10 DIAGNOSIS — R42 Dizziness and giddiness: Secondary | ICD-10-CM | POA: Diagnosis present

## 2019-05-10 DIAGNOSIS — I10 Essential (primary) hypertension: Secondary | ICD-10-CM | POA: Diagnosis present

## 2019-05-10 LAB — CBC WITH DIFFERENTIAL/PLATELET
Basophils Absolute: 0.1 x10E3/uL (ref 0.0–0.2)
Basos: 1 %
EOS (ABSOLUTE): 0.1 x10E3/uL (ref 0.0–0.4)
Eos: 1 %
Hematocrit: 46.1 % (ref 37.5–51.0)
Hemoglobin: 15.1 g/dL (ref 13.0–17.7)
Immature Grans (Abs): 0 x10E3/uL (ref 0.0–0.1)
Immature Granulocytes: 0 %
Lymphocytes Absolute: 3.4 x10E3/uL — ABNORMAL HIGH (ref 0.7–3.1)
Lymphs: 28 %
MCH: 29.3 pg (ref 26.6–33.0)
MCHC: 32.8 g/dL (ref 31.5–35.7)
MCV: 90 fL (ref 79–97)
Monocytes Absolute: 0.7 x10E3/uL (ref 0.1–0.9)
Monocytes: 6 %
Neutrophils Absolute: 7.8 x10E3/uL — ABNORMAL HIGH (ref 1.4–7.0)
Neutrophils: 64 %
Platelets: 437 x10E3/uL (ref 150–450)
RBC: 5.15 x10E6/uL (ref 4.14–5.80)
RDW: 13.2 % (ref 11.6–15.4)
WBC: 12.1 x10E3/uL — ABNORMAL HIGH (ref 3.4–10.8)

## 2019-05-10 LAB — BASIC METABOLIC PANEL WITH GFR
BUN/Creatinine Ratio: 15 (ref 10–24)
BUN: 14 mg/dL (ref 8–27)
CO2: 22 mmol/L (ref 20–29)
Calcium: 10.3 mg/dL — ABNORMAL HIGH (ref 8.6–10.2)
Chloride: 101 mmol/L (ref 96–106)
Creatinine, Ser: 0.96 mg/dL (ref 0.76–1.27)
GFR calc Af Amer: 86 mL/min/1.73
GFR calc non Af Amer: 74 mL/min/1.73
Glucose: 91 mg/dL (ref 65–99)
Potassium: 5.1 mmol/L (ref 3.5–5.2)
Sodium: 140 mmol/L (ref 134–144)

## 2019-05-10 LAB — LIPID PANEL
Chol/HDL Ratio: 3 ratio (ref 0.0–5.0)
Cholesterol, Total: 195 mg/dL (ref 100–199)
HDL: 64 mg/dL
LDL Chol Calc (NIH): 112 mg/dL — ABNORMAL HIGH (ref 0–99)
Triglycerides: 110 mg/dL (ref 0–149)
VLDL Cholesterol Cal: 19 mg/dL (ref 5–40)

## 2019-05-10 NOTE — Progress Notes (Signed)
VASCULAR LAB PRELIMINARY  PRELIMINARY  PRELIMINARY  PRELIMINARY  Carotid duplex completed.    Preliminary report:  See CV proc for preliminary results.   Marquitta Persichetti, RVT 05/10/2019, 2:27 PM

## 2019-05-11 ENCOUNTER — Encounter: Payer: Self-pay | Admitting: General Practice

## 2019-05-21 ENCOUNTER — Other Ambulatory Visit: Payer: Self-pay

## 2019-05-21 ENCOUNTER — Ambulatory Visit (INDEPENDENT_AMBULATORY_CARE_PROVIDER_SITE_OTHER): Payer: Medicare Other | Admitting: Family Medicine

## 2019-05-21 ENCOUNTER — Encounter: Payer: Self-pay | Admitting: Family Medicine

## 2019-05-21 ENCOUNTER — Encounter: Payer: Self-pay | Admitting: *Deleted

## 2019-05-21 DIAGNOSIS — G8929 Other chronic pain: Secondary | ICD-10-CM | POA: Diagnosis not present

## 2019-05-21 DIAGNOSIS — M25511 Pain in right shoulder: Secondary | ICD-10-CM

## 2019-05-21 DIAGNOSIS — M25559 Pain in unspecified hip: Secondary | ICD-10-CM

## 2019-05-21 DIAGNOSIS — M25552 Pain in left hip: Secondary | ICD-10-CM | POA: Diagnosis not present

## 2019-05-21 NOTE — Progress Notes (Signed)
I saw and examined the patient with Dr. Mayer Masker and agree with assessment and plan as outlined.    Chronic right shoulder pain with impingement symptoms and myofascial pain.  Subacromial injection today and PT referral.  Chronic left hip pain at posterior greater trochanter.  Severe DJD on x-ray in 2016.  Has very limited ROM in hip.  Will inject greater trochanter today and PT referral.  Could do intraarticular injection if this doesn't help.

## 2019-05-21 NOTE — Progress Notes (Addendum)
Patrick Ortega - 79 y.o. male MRN [id number]  Date of birth: 07-26-1940  Office Visit Note: Visit Date: 05/21/2019 PCP: Antony Blackbird, MD Referred by: Antony Blackbird, MD  Subjective: Chief Complaint  Patient presents with  . Right Shoulder - Pain    Pain posterior shoulder and down the right side of his back. No relief with massage and PT. Pain x year.  . Left Hip - Pain    Pain in lateral/posterior hip x 1 year. The pain started in the hip and then the shoulder started hurting a bit later. His issues started after carrying a bag of sand over his left shoulder in [place]. He fell to the ground onto the left knee.    HPI: Patrick Ortega is a 79 y.o. male who comes in today with right shoulder pain and left pain for the past year.  Hip pain: He reports that his posterior-lateral hip has been bothering him since he fell onto his left side in [place] last year when carrying a bag of sand. He has tried to go to a local massage therapist which has not helped the pain. No pain down his leg or into his groin.   Shoulder pain- describes posterior shoulder pain, pain in his right upper back. No relief with massage. No shooting pain down his arm. No numbness/tingling. No pain with neck movements.  He has taken meloxicam for the pain which has helped some. He has rheumatoid arthritis, is prescribed methotrexate but does not take it.    ROS Otherwise per HPI.  Assessment & Plan: Visit Diagnoses:  1. Chronic right shoulder pain   2. Hip pain     Plan:  1. Hip pain- X-rays from 2016 show severe arthritis in hip. Patient points to gluteus medius when he describes the pain, although the pain he has worsens with internal rotation of hip. Will start with corticosteroid injection of posterior greater trochanter at attachment of gluteus medius. Will also refer for formal physical therapy. If pain persists, will trial an intra-articular hip injection.  2. Shoulder/upper back pain- pain over posterior  acromion with some signs of impingement and scapular dyskinesia. Subacromial steroid injection given today with good pain relief. Will refer to PT to work on scapular stabilization and several myofascial trigger points.   Meds & Orders: No orders of the defined types were placed in this encounter.   Orders Placed This Encounter  Procedures  . Ambulatory referral to Physical Therapy    Follow-up: No follow-ups on file.   Procedures: Procedure performed: subacromial corticosteroid injection; palpation guided  Noted no overlying erythema, induration, or other signs of local infection. The right posterior subacromial space was palpated and marked. The overlying skin was prepped in a sterile fashion. Topical analgesic spray: Ethyl chloride. Joint: right subacromial Needle: 25 gauge, 1.5 inch Completed without difficulty. Meds: 3 cc 1% lidocaine without epinephrine, 40 mg depo-medrol  Procedure performed: greater trochanter corticosteroid injection  Noted no overlying erythema, induration, or other signs of local infection. The right greater trochanter was palpated, area of maximal tenderness was marked. The overlying skin was prepped in a sterile fashion. Topical analgesic spray: Ethyl chloride. Joint: left posterior greater trochnater Needle: spinal needle Completed without difficulty. Meds: 8 cc lidocaine, 40 mg methylpred   Clinical History: No specialty comments available.   He reports that he has quit smoking. His smoking use included cigarettes. He has a 0.13 pack-year smoking history. He has never used smokeless tobacco. No results for input(s): HGBA1C, LABURIC  in the last 8760 hours.  Objective:  VS:  HT:    WT:   BMI:     BP:   HR: bpm  TEMP: ( )  RESP:  Physical Exam  PHYSICAL EXAM: Gen: NAD, alert, cooperative with exam, well-appearing HEENT: clear conjunctiva,  CV:  no edema, capillary refill brisk, normal rate Resp: non-labored Skin: no rashes, normal turgor    Neuro: no gross deficits.  Psych:  alert and oriented  Ortho Exam  Left Hip:  - Inspection: No gross deformity, no swelling, erythema, or ecchymosis - Palpation: TTP over posterior greater trochanter and gluteus medius muscle - ROM: Limited IR, normal ER, flexion, extension - Strength: Normal strength. - Neuro/vasc: NV intact distally - Special Tests: Positive FADIR (causes worsening pain in posterior-lateral hip), pain with flexion. Negative Scour.   Right Shoulder: Inspection reveals no obvious deformity, atrophy, or asymmetry. No bruising. No swelling TTP over posterior acromion, lateral trapezius, rhomboid-- multiple myofascial trigger points appreciated Full ROM in flexion, abduction, internal/external rotation NV intact distally Normal scapular function observed. Special Tests:  - Impingement: positive Hawkins, neers - Supraspinatous: Negative empty can.  5/5 strength with resisted flexion at 20 degrees - Infraspinatous/Teres Minor: 5/5 strength with ER - Subscapularis:  5/5 strength with IR  R scapula internally rotated, less movement than left    Imaging: Xrays from 2016 with severe DJD  Past Medical/Family/Surgical/Social History: Medications & Allergies reviewed per EMR, new medications updated. Patient Active Problem List   Diagnosis Date Noted  . 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 Essential hypertension 05/24/2014  . Lymphocytosis 05/24/2014  . Osteoarthritis of left hip 05/24/2014   Past Medical History:  Diagnosis Date  . GERD (gastroesophageal reflux disease)   . Hypertension    History reviewed. No pertinent family history. Past Surgical History:  Procedure Laterality Date  . NO PAST SURGERIES     Social History   Occupational History  . Not on file  Tobacco Use  . Smoking status: Former Smoker    Packs/day: 0.25    Years: 0.50    Pack years: 0.12    Types: Cigarettes  . Smokeless tobacco: Never Used   Substance and Sexual Activity  . Alcohol use: Yes    Alcohol/week: 24.0 standard drinks    Types: 24 Cans of beer per week  . Drug use: Not on file  . Sexual activity: Yes

## 2019-05-23 ENCOUNTER — Other Ambulatory Visit: Payer: Self-pay

## 2019-05-23 ENCOUNTER — Ambulatory Visit: Payer: Medicare Other | Attending: Family Medicine | Admitting: Pharmacist

## 2019-05-23 VITALS — BP 169/76 | HR 71

## 2019-05-23 DIAGNOSIS — Z87891 Personal history of nicotine dependence: Secondary | ICD-10-CM | POA: Diagnosis not present

## 2019-05-23 DIAGNOSIS — Z79899 Other long term (current) drug therapy: Secondary | ICD-10-CM | POA: Diagnosis not present

## 2019-05-23 DIAGNOSIS — I10 Essential (primary) hypertension: Secondary | ICD-10-CM | POA: Diagnosis present

## 2019-05-23 MED ORDER — CHLORTHALIDONE 25 MG PO TABS: 12.5000 mg | ORAL_TABLET | Freq: Every day | ORAL | 2 refills | Status: DC

## 2019-05-23 MED FILL — ?CHLORTHALIDONE 25MG TABL: 25 | 30 days supply | Qty: 15 | Fill #0

## 2019-05-23 NOTE — Progress Notes (Signed)
    S:    PCP: Dr. Chapman Fitch  Patient arrives in good spirits. Presents to the clinic for BP check.  Patient was referred and last seen by Primary Care Provider on 05/09/2019. Maxzide was started at that appointment. Today patient reports stopping Maxzide d/t dizziness.   Medication adherence: denies.  Current BP Medications include:  Amlodipine 5 mg daily, Mazide 25-27.5 mg daily (not taking)  Dietary habits include: limits salt, denies drinking caffeine  Exercise habits include: limited Family / Social history:  FHx: no pertinent positives listed Tobacco: former smoker Alcohol: reports use; heavy use of 24 drinks/wk per chart  O:  Vitals:   05/23/19 1021  BP: (!) 169/76  Pulse: 71   Home BP readings: none  Last 3 Office BP readings: BP Readings from Last 3 Encounters:  05/23/19 (!) 169/76  05/09/19 (!) 174/74  04/25/19 (!) 176/78    BMET    Component Value Date/Time   NA 140 05/09/2019 1048   K 5.1 05/09/2019 1048   CL 101 05/09/2019 1048   CO2 22 05/09/2019 1048   GLUCOSE 91 05/09/2019 1048   GLUCOSE 90 05/15/2014 1052   BUN 14 05/09/2019 1048   CREATININE 0.96 05/09/2019 1048   CREATININE 0.75 05/15/2014 1052   CALCIUM 10.3 (H) 05/09/2019 1048   GFRNONAA 74 05/09/2019 1048   GFRNONAA >89 05/15/2014 1052   GFRAA 86 05/09/2019 1048   GFRAA >89 05/15/2014 1052    Renal function: CrCl cannot be calculated (Unknown ideal weight.).  Clinical ASCVD: No  The ASCVD Risk score Mikey Bussing DC Jr., et al., 2013) failed to calculate for the following reasons:   The 2013 ASCVD risk score is only valid for ages 36 to 101   A/P: Hypertension longstanding currently uncontrolled on current medications. SBP Goal = < 130 mmHg. Medication adherence: denies. Will stop maxzide and start 12.5 mg daily of chlorthalidone. Counseling provided on hydration and slow body position changes. -Stop Maxzide. -Start chlorthalidone 12.5 mg daily   -Counseled on lifestyle modifications for blood  pressure control including reduced dietary sodium, increased exercise, adequate sleep.  Results reviewed and written information provided.   Total time in face-to-face counseling 15 minutes.   F/U Clinic Visit in 2 weeks with Dr. Chapman Fitch.   Benard Halsted, PharmD, [place] [phone]

## 2019-05-24 ENCOUNTER — Encounter: Payer: Self-pay | Admitting: Pharmacist

## 2019-06-06 ENCOUNTER — Ambulatory Visit: Payer: Medicare Other | Admitting: Family Medicine

## 2019-06-07 ENCOUNTER — Ambulatory Visit: Payer: Medicare Other | Attending: Family Medicine | Admitting: Physical Therapy

## 2019-06-07 ENCOUNTER — Other Ambulatory Visit: Payer: Self-pay

## 2019-06-07 ENCOUNTER — Encounter: Payer: Self-pay | Admitting: Physical Therapy

## 2019-06-07 DIAGNOSIS — M25511 Pain in right shoulder: Secondary | ICD-10-CM | POA: Insufficient documentation

## 2019-06-07 DIAGNOSIS — M25552 Pain in left hip: Secondary | ICD-10-CM | POA: Diagnosis present

## 2019-06-07 DIAGNOSIS — G8929 Other chronic pain: Secondary | ICD-10-CM | POA: Insufficient documentation

## 2019-06-07 DIAGNOSIS — R296 Repeated falls: Secondary | ICD-10-CM | POA: Diagnosis present

## 2019-06-07 DIAGNOSIS — R262 Difficulty in walking, not elsewhere classified: Secondary | ICD-10-CM | POA: Insufficient documentation

## 2019-06-07 DIAGNOSIS — M25611 Stiffness of right shoulder, not elsewhere classified: Secondary | ICD-10-CM | POA: Insufficient documentation

## 2019-06-07 DIAGNOSIS — R293 Abnormal posture: Secondary | ICD-10-CM | POA: Insufficient documentation

## 2019-06-07 NOTE — Patient Instructions (Signed)
   Voncille Lo, PT Certified Exercise Expert for the Aging Adult  06/07/19 8:35 AM Phone: [phone] Fax: [phone]

## 2019-06-07 NOTE — Therapy (Signed)
[place], [place], [address] Phone: [phone]   Fax:  [phone]  Physical Therapy Evaluation  Patient Details  Name: Patrick Ortega MRN: [id number] Date of Birth: 04-29-1940 Referring Provider (PT): Eunice Blase MD   Encounter Date: 06/07/2019  PT End of Session - 06/07/19 1013    Visit Number  1    Number of Visits  13    Date for PT Re-Evaluation  07/19/19    Authorization Type  [employer]    PT Start Time  0745    PT Stop Time  0844    PT Time Calculation (min)  59 min    Activity Tolerance  Patient tolerated treatment well    Behavior During Therapy  [facility] for tasks assessed/performed       Past Medical History:  Diagnosis Date  . GERD (gastroesophageal reflux disease)   . Hypertension     Past Surgical History:  Procedure Laterality Date  . NO PAST SURGERIES      There were no vitals filed for this visit.   Subjective Assessment - 06/07/19 0756    Subjective  Pt through interpreter and grandson states he has had pain in RT arm for about a year.  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  I want to move better so I can dance with the ladies    Patient is accompained by:  Family member;Interpreter   grandson and  interpreter Ticio   Pertinent History  HTN, obesity, RA , OA, GERD    Limitations  Lifting;Walking;Standing    How long can you sit comfortably?  unlimited for shoulder    How long can you stand comfortably?  15 min    How long can you walk comfortably?  15 min    Patient Stated Goals  I want to be able to move better  without pain    Currently in Pain?  Yes    Pain Score  8     Pain Location  Shoulder    Pain Orientation  Right    Pain Descriptors / Indicators  Aching    Pain Type  Chronic pain    Pain Onset  More than a month ago    Pain Frequency  Intermittent    Aggravating Factors   when moving , I sleep on RT side  becaues LT hip hurts    Multiple Pain Sites  Yes    Pain Score  6    Pain Location  Hip    Pain Orientation  Left    Pain Descriptors / Indicators  Aching;Sore    Pain Type  Chronic pain    Pain Onset  More than a month ago    Pain Frequency  Constant    Aggravating Factors   steps, walking, falls because of dizzy medicine         [facility] PT Assessment - 06/07/19 0001      Assessment   Medical Diagnosis  chronic RT shld pain, LT hip pain    Referring Provider (PT)  Hilts, Michael MD    Onset Date/Surgical Date  --   about a year   Hand Dominance  --   ambidextrouds   Prior Therapy  none      Precautions   Precaution Comments  Falls      Restrictions   Weight Bearing Restrictions  No      Balance  Screen   Has the patient fallen in the past 6 months  Yes    How many times?  3   due to dizziness and pills   Has the patient had a decrease in activity level because of a fear of falling?   Yes    Is the patient reluctant to leave their home because of a fear of falling?   Yes      Prior Function   Level of Independence  Independent    Vocation  Retired      Charity fundraiser Status  Within Functional Limits for tasks assessed      Observation/Other Assessments   Focus on Therapeutic Outcomes (FOTO)   NA  language      Functional Tests   Functional tests  Squat;Sit to Stand;Other      Squat   Comments  Pt squats with RT lean but only 1/2 range.    tightened gastrocs, heels up  forward pitch and needsUE supp     Sit to Stand   Comments  5 x STS 13.07   greater than 13 sec risk of fall     Other:   Other/ Comments  stand to tall kneeling needs bil UE support to stand      Posture/Postural Control   Posture/Postural Control  Postural limitations    Postural Limitations  Rounded Shoulders;Forward head;Anterior pelvic tilt    Posture Comments  increased abdominal girth      ROM / Strength   AROM / PROM / Strength  AROM;Strength      AROM   Overall  AROM   Deficits    Right Shoulder Extension  30 Degrees    Right Shoulder Flexion  120 Degrees   PROM 126 pain   Right Shoulder ABduction  117 Degrees   124   Right Shoulder Internal Rotation  --   thumb to buttock   Right Shoulder External Rotation  --   back of occiput   Left Shoulder Extension  30 Degrees    Left Shoulder Flexion  150 Degrees   PROM 155   Left Shoulder ABduction  147 Degrees    Left Shoulder Internal Rotation  --   t-12   Left Shoulder External Rotation  --    C-6   Right Hip Flexion  119    Right Hip External Rotation   40    Right Hip Internal Rotation   25    Left Hip Flexion  110    Left Hip External Rotation   30    Left Hip Internal Rotation   10      Strength   Overall Strength  Deficits    Right Shoulder Flexion  3-/5    Right Shoulder ABduction  3-/5    Right Shoulder Internal Rotation  4/5    Right Shoulder External Rotation  4-/5    Left Shoulder Flexion  5/5    Left Shoulder ABduction  5/5    Left Shoulder Internal Rotation  5/5    Left Shoulder External Rotation  5/5    Right Hip Flexion  4-/5    Right Hip Extension  4-/5    Right Hip ABduction  4-/5    Left Hip Flexion  4-/5    Left Hip Extension  3-/5    Left Hip ABduction  3-/5    Right Knee Flexion  4+/5    Right Knee Extension  4+/5    Left Knee Flexion  4+/5    Left Knee Extension  4+/5      Flexibility   Soft Tissue Assessment /Muscle Length  yes    Hamstrings  bil 55 degrees      Palpation   Palpation comment  tenderness over posterior shoulder, tight post shld capsule, tightened bil pecs, LT hip gluteal and piriformis TTP ,decreased joint AROM      Ambulation/Gait   Ambulation Distance (Feet)  120 Feet    Assistive device  None    Gait Pattern  Step-through pattern;Decreased step length - left;Decreased stance time - left;Decreased dorsiflexion - left;Decreased weight shift to left;Antalgic    Ambulation Surface  Level          Demo of exericises  For THERE Ex    Needed extra time for language and explanation    Access Code: [email or web address]: [email or web address]: 06/03/2021Prepared by: Donnetta Simpers BeardsleyExercises  Doorway Pec Stretch at 90 Degrees Abduction - 3 x daily - 7 x weekly - 3 sets - 3 reps - 30 hold  Supine Shoulder External Rotation in 45 Degrees Abduction AAROM with Dowel - 1 x daily - 7 x weekly - 2 sets - 10 reps  Supine Shoulder Flexion Extension AAROM with Dowel - 1 x daily - 7 x weekly - 2 sets - 10 reps  Supine Piriformis Stretch with Leg Straight - 1 x daily - 7 x weekly - 10 reps - 3 sets  Supine Hip External Rotation Stretch - 1 x daily - 7 x weekly - 10 reps - 3 sets      Objective measurements completed on examination: See above findings.              PT Education - 06/07/19 1015    Education Details  POC  Explanaation of findings   intial HEP to shoudler and hip    Person(s) Educated  Patient    Methods  Explanation;Demonstration;Tactile cues;Verbal cues;Handout    Comprehension  Verbalized understanding;Returned demonstration       PT Short Term Goals - 06/07/19 0838      PT SHORT TERM GOAL #1   Title  STG=LTG        PT Long Term Goals - 06/07/19 0840      PT LONG TERM GOAL #1   Title  Pt will be independent with all HEP given    Baseline  no knowledge    Time  6    Period  Weeks    Status  New    Target Date  07/19/19      PT LONG TERM GOAL #2   Title  Demonstrate at least  4/5 LT LE RT UE strength to improve stability, ability to carry groceries, safety and endurance of community level function    Baseline  Pt RT UE 3-/5 and LT UE 3-/5/4-/5    Time  6    Period  Weeks    Status  New    Target Date  07/19/19      PT LONG TERM GOAL #3   Title  PT with be able to walk/stand >/= 1 hour with no AD with </= 2/10 pain for functional endurance and return to leisure activities post DC    Baseline  Pt hip pain 8/10    Time  6    Period  Weeks    Status  New    Target Date   07/19/19      PT LONG TERM GOAL #4   Title  Pt  will be able to ascend /descend steps without exacerbating pain and able to carry 20 lb or groceries    Baseline  unable to ascend descend steps without UE support and one step at a time    Time  6    Period  Weeks    Status  New    Target Date  07/19/19      PT LONG TERM GOAL #5   Title  Pt will be able to place 20 # above shoulders in order to carry and put away groceries and household items without exacerbating pain in RT shoulder    Baseline  Pt pain in shoulder 6 /10    Time  6    Period  Weeks    Status  New    Target Date  07/19/19             Plan - 06/07/19 0757    Clinical Impression Statement  79 yo presents to clinic with antalgic gait of LT hip and chronic pain and decreased AROM of RT shld for about a year.  Pt reports 3 falls he attributes to medication for HTN but is able to get to 1/2 kneeling from standing but needs bil UE support to rise.  Pt 5 x STS 13.07 sec and is slightly at risk of falls due to LE  weakness. Pt has forward head and rounded posture which affects RT shld AROM.  Due to his impairments, falls pt will benefit from skilled PT to address impairments and returng to pain free PLOF    Personal Factors and Comorbidities  Comorbidity 1;Comorbidity 2;Age;Education    Comorbidities  HTN, obesity, RA , OA, GERD, 3 falls in 6 months due to Medication ?by grandson and pt    Examination-Activity Limitations  Squat;Stairs;Stand;Lift;Carry;Locomotion Level    Examination-Participation Restrictions  Community Activity;Yard Work    [employer]chant [employer] officer  Evolving/Moderate complexity    Clinical Decision Making  Moderate    Rehab Potential  Good    PT Frequency  2x / week    PT Duration  6 weeks    PT Treatment/Interventions  ADLs/Self Care Home Management;Cryotherapy;Electrical Stimulation;Iontophoresis 4mg /ml Dexamethasone;Moist Heat;Ultrasound;Balance training;Therapeutic exercise;Therapeutic  activities;Functional mobility training;Stair training;Gait training;Neuromuscular re-education;Patient/family education;Passive range of motion;Manual techniques;Dry needling;Taping;Joint Manipulations    PT Next Visit Plan  manual of RT shld and LT hip.  steps BERG balance?    PT Home Exercise Plan  [email or web address] pec stretch bil , supine cane for ER and FLEX, prirformis, stretch, ankle on knee ER stretch    Consulted and Agree with Plan of Care  Patient       Patient will benefit from skilled therapeutic intervention in order to improve the following deficits and impairments:  Pain, Obesity, Impaired UE functional use, Improper body mechanics, Postural dysfunction, Impaired flexibility, Hypomobility, Decreased strength, Decreased range of motion, Dizziness, Decreased balance(dizziness due to medication)  Visit Diagnosis: Chronic right shoulder pain  Pain in left hip  Stiffness of right shoulder, not elsewhere classified  Abnormal posture  Repeated falls  Difficulty in walking, not elsewhere classified     Problem List Patient Active Problem List   Diagnosis Date Noted  . Rheumatoid arthritis with positive rheumatoid factor ([place]) 08/12/2016  . Essential hypertension 05/24/2014  . Lymphocytosis 05/24/2014  . Osteoarthritis of left hip 05/24/2014   Voncille Lo, PT Certified Exercise Expert for the Aging Adult  06/07/19 10:16 AM Phone: [phone] Fax: [phone]  [place],  [place], [address] Phone: [phone]   Fax:  [phone]  Name: Patrick Ortega MRN: [id number] Date of Birth: 11-25-1940

## 2019-06-14 ENCOUNTER — Other Ambulatory Visit: Payer: Self-pay

## 2019-06-14 ENCOUNTER — Ambulatory Visit: Payer: Medicare Other | Attending: Family Medicine | Admitting: Physician Assistant

## 2019-06-14 VITALS — BP 171/77 | HR 70 | Temp 97.6°F | Resp 16

## 2019-06-14 DIAGNOSIS — Z791 Long term (current) use of non-steroidal anti-inflammatories (NSAID): Secondary | ICD-10-CM | POA: Insufficient documentation

## 2019-06-14 DIAGNOSIS — Z79899 Other long term (current) drug therapy: Secondary | ICD-10-CM | POA: Insufficient documentation

## 2019-06-14 DIAGNOSIS — R42 Dizziness and giddiness: Secondary | ICD-10-CM

## 2019-06-14 DIAGNOSIS — Z789 Other specified health status: Secondary | ICD-10-CM

## 2019-06-14 DIAGNOSIS — K219 Gastro-esophageal reflux disease without esophagitis: Secondary | ICD-10-CM | POA: Diagnosis not present

## 2019-06-14 DIAGNOSIS — I10 Essential (primary) hypertension: Secondary | ICD-10-CM

## 2019-06-14 MED ORDER — CHLORTHALIDONE 25 MG PO TABS: 25.0000 mg | ORAL_TABLET | Freq: Every day | ORAL | 2 refills | Status: DC

## 2019-06-14 MED ORDER — MECLIZINE HCL 12.5 MG PO TABS: 12.5000 mg | ORAL_TABLET | Freq: Three times a day (TID) | ORAL | 1 refills | Status: DC | PRN

## 2019-06-14 MED FILL — ?CHLORTHALIDONE 25MG TABL: 25 | 30 days supply | Qty: 15 | Fill #1

## 2019-06-14 NOTE — Progress Notes (Signed)
Patrick Ortega, is a 79 y.o. male  FIE:[id number]  [id number]  DOB - 1940/10/09  Subjective:  Chief Complaint and HPI: Patrick Ortega is a 79 y.o. male here today for BP f/up.  Blood pressure not at goal on 12.5 mg chlorthalidone.  Compliant with meds.  Dizziness still occurs a little in the mornings but is overall better.  No CP/SOB.    Patrick Ortega with [employer] interpreters  ROS:   Constitutional:  No f/c, No night sweats, No unexplained weight loss. EENT:  No vision changes, No blurry vision, No hearing changes. No mouth, throat, or ear problems.  Respiratory: No cough, No SOB Cardiac: No CP, no palpitations GI:  No abd pain, No N/V/D. GU: No Urinary s/sx Musculoskeletal: No joint pain Neuro: No headache, no dizziness, no motor weakness.  Skin: No rash Endocrine:  No polydipsia. No polyuria.  Psych: Denies SI/HI  No problems updated.  ALLERGIES: No Known Allergies  PAST MEDICAL HISTORY: Past Medical History:  Diagnosis Date  . GERD (gastroesophageal reflux disease)   . Hypertension     MEDICATIONS AT HOME: Prior to Admission medications   Medication Sig Start Date End Date Taking? Authorizing Provider  amLODipine (NORVASC) 5 MG tablet Take 1 tablet (5 mg total) by mouth daily. 04/25/19  Yes Maxum Cassarino M, PA-C  meclizine (ANTIVERT) 12.5 MG tablet Take 1 tablet (12.5 mg total) by mouth 3 (three) times daily as needed for dizziness. 06/14/19  Yes Nihal Marzella, Dionne Bucy, PA-C  meloxicam (MOBIC) 15 MG tablet TAKE 1 TABLET BY MOUTH ONCE DAILY AS NEEDED FOR PAIN 04/25/19  Yes Halayna Blane M, PA-C  methotrexate (RHEUMATREX) 7.5 MG tablet TAKE ONE TABLET BY MOUTH ONCE A WEEK. 08/12/16  Yes Hairston, Mandesia R, FNP  Vitamin D, Ergocalciferol, (DRISDOL) 1.25 MG (50000 UNIT) CAPS capsule Take 1 capsule (50,000 Units total) by mouth every 7 (seven) days. 04/26/19  Yes Freeman Caldron M, PA-C  chlorthalidone (HYGROTON) 25 MG tablet Take 1 tablet (25 mg total) by mouth daily. 06/14/19    Argentina Donovan, PA-C     Objective:  EXAM:   Vitals:   06/14/19 1528  BP: (!) 171/77  Pulse: 70  Resp: 16  Temp: 97.6 F (36.4 C)  SpO2: 98%    General appearance : A&OX3. NAD. Non-toxic-appearing HEENT: Atraumatic and Normocephalic.  PERRLA. EOM intact.  TM clear B. Mouth-MMM, post pharynx WNL w/o erythema, No PND. Neck: supple, no JVD. No cervical lymphadenopathy. No thyromegaly Chest/Lungs:  Breathing-non-labored, Good air entry bilaterally, breath sounds normal without rales, rhonchi, or wheezing  CVS: S1 S2 regular, no murmurs, gallops, rubs  Abdomen: Bowel sounds present, Non tender and not distended with no gaurding, rigidity or rebound. Extremities: Bilateral Lower Ext shows no edema, both legs are warm to touch with = pulse throughout Neurology:  CN II-XII grossly intact, Non focal.   Psych:  TP linear. J/I WNL. Normal speech. Appropriate eye contact and affect.  Skin:  No Rash  Data Review No results found for: HGBA1C   Assessment & Plan   1. Uncontrolled hypertension Increase dose.  Obtain home BP meter which he agrees to do.  Check BP daily and record - chlorthalidone (HYGROTON) 25 MG tablet; Take 1 tablet (25 mg total) by mouth daily.  Dispense: 30 tablet; Refill: 2 - Basic metabolic panel  2. Dizziness Much improved - meclizine (ANTIVERT) 12.5 MG tablet; Take 1 tablet (12.5 mg total) by mouth 3 (three) times daily as needed for dizziness.  Dispense: 30 tablet;  Refill: 1 - Basic metabolic panel  3. Language barrier [employer] interpreters used and additional time performing visit was required.   Patient have been counseled extensively about nutrition and exercise  Return in about 1 month (around 07/14/2019) for PCP or [facility] for blood pressure follow up.  The patient was given clear instructions to go to ER or return to medical center if symptoms don't improve, worsen or new problems develop. The patient verbalized understanding. The patient was told to  call to get lab results if they haven't heard anything in the next week.     Freeman Caldron, PA-C [facility] [place], [place]   06/14/2019, 4:41 PMPatient ID: Patrick Ortega, male   DOB: [birth year], 79 y.o.   MRN: [id number]

## 2019-06-14 NOTE — Progress Notes (Signed)
Needs chlorthalidone  meds refill

## 2019-06-14 NOTE — Patient Instructions (Addendum)
Check you blood pressure daily and record and bring numbers to next visit.      Hipertensin en los adultos Hypertension, Adult La presin arterial alta (hipertensin) se produce cuando la fuerza de la sangre bombea a travs de las arterias con mucha fuerza. Las arterias son los vasos sanguneos que transportan la sangre desde el corazn al resto del cuerpo. La hipertensin hace que el corazn haga ms esfuerzo para Chiropodist y [employer] que las arterias se Teacher, music o Advertising account executive. La hipertensin no tratada o no controlada puede causar infarto de miocardio, insuficiencia cardaca, accidente cerebrovascular, enfermedad renal y otros problemas. Una lectura de la presin arterial consta de un nmero ms alto sobre un nmero ms bajo. En condiciones ideales, la presin arterial debe estar por debajo de 120/80. El Teacher, English as a foreign language (superior) es la presin sistlica. Es la medida de la presin de las arterias cuando el corazn late. El segundo nmero (inferior) es la presin diastlica. Es la medida de la presin en las arterias cuando el corazn se relaja. Cules son las causas? Se desconoce la causa exacta de esta afeccin. Hay algunas afecciones que causan presin arterial alta o estn relacionadas con ella. Qu incrementa el riesgo? Algunos factores de riesgo de hipertensin estn bajo su control. Los siguientes factores pueden hacer que sea ms propenso a Armed forces training and education officer afeccin:  Fumar.  Tener diabetes mellitus tipo 2, colesterol alto, o ambos.  No hacer la cantidad suficiente de actividad fsica o ejercicio.  Tener sobrepeso.  Consumir mucha grasa, azcar, caloras o sal (sodio) en su dieta.  Beber alcohol en exceso. Algunos factores de riesgo para la presin arterial alta pueden ser difciles o imposibles de Quarry manager. Algunos de estos factores son los siguientes:  Tener enfermedad renal crnica.  Tener antecedentes familiares de presin arterial alta.  Edad. Los  riesgos aumentan con la edad.  Raza. El riesgo es mayor para las Retail banker.  Sexo. Antes de los 45aos, los hombres corren ms [employer]. Despus de los 65aos, las mujeres corren ms [employer].  Tener apnea obstructiva del sueo.  Estrs. Cules son los signos o los sntomas? Es posible que la presin arterial alta puede no cause sntomas. La presin arterial muy alta (crisis hipertensiva) puede provocar:  Dolor de cabeza.  Ansiedad.  Falta de aire.  Hemorragia nasal.  Nuseas y vmitos.  Cambios en la visin.  Dolor de pecho intenso.  Convulsiones. Cmo se diagnostica? Esta afeccin se diagnostica al medir su presin arterial mientras se encuentra sentado, con el brazo apoyado sobre una superficie plana, las piernas sin cruzar y los pies bien apoyados en el piso. El brazalete del tensimetro debe colocarse directamente sobre la piel de la parte superior del brazo y al nivel de su corazn. Debe medirla al [facility] veces en el mismo brazo. Determinadas condiciones pueden causar una diferencia de presin arterial entre el brazo izquierdo y Insurance underwriter. Ciertos factores pueden provocar que las lecturas de la presin arterial sean inferiores o superiores a lo normal por un perodo corto de tiempo:  Si su presin arterial es ms alta cuando se encuentra en el consultorio del mdico que cuando la mide en su hogar, se denomina hipertensin de bata blanca. La [employer] de las personas que tienen esta afeccin no deben ser [employer].  Si su presin arterial es ms alta en el hogar que cuando se encuentra en el consultorio del mdico, se denomina hipertensin enmascarada. La [employer] de las personas que tienen esta  afeccin deben ser medicadas para controlar la presin arterial. Si tiene una lecturas de presin arterial alta durante una visita o si tiene presin arterial normal con otros factores de riesgo, se le podr pedir que haga lo  siguiente:  Que regrese otro da para volver a Chief Technology Officer su presin arterial nuevamente.  Que se controle la presin arterial en su casa durante 1 semana o ms. Si se le diagnostica hipertensin, es posible que se le realicen otros anlisis de sangre o estudios de diagnstico por imgenes para ayudar a su mdico a comprender su riesgo general de tener otras afecciones. Cmo se trata? Esta afeccin se trata haciendo cambios saludables en el estilo de vida, tales como ingerir alimentos saludables, realizar ms ejercicio y reducir el consumo de alcohol. El mdico puede recetarle medicamentos si los cambios en el estilo de vida no son suficientes para Child psychotherapist la presin arterial y si:  Su presin arterial sistlica est por encima de 130.  Su presin arterial diastlica est por encima de 80. La presin arterial deseada puede variar en funcin de las enfermedades, la edad y otros factores personales. Siga estas instrucciones en su casa: Comida y bebida   Siga una dieta con alto contenido de fibras y [place], y con bajo contenido de sodio, Location manager agregada y Physicist, medical. Un ejemplo de plan alimenticio es la dieta DASH (Dietary Approaches to Stop Hypertension, Mtodos alimenticios para detener la hipertensin). Para alimentarse de esta manera: ? Coma mucha fruta y [place]. Trate de que la mitad del plato de cada comida sea de frutas y verduras. ? Coma cereales integrales, como pasta integral, arroz integral o pan integral. Llene aproximadamente un cuarto del plato con cereales integrales. ? Coma y beba productos lcteos con bajo contenido de grasa, como leche descremada o yogur bajo en grasas. ? Evite la ingesta de cortes de carne grasa, carne procesada o curada, y carne de ave con piel. Llene aproximadamente un cuarto del plato con protenas magras, como pescado, pollo sin piel, frijoles, huevos o tofu. ? Evite ingerir alimentos prehechos y procesados. En general, estos tienen mayor  cantidad de sodio, azcar agregada y Wendee Copp.  Reduzca su ingesta diaria de sodio. La mayora de las personas que tienen hipertensin deben comer menos de 1500 mg de sodio por [employer].  No beba alcohol si: ? Su mdico le indica no hacerlo. ? Est embarazada, puede estar embarazada o est tratando de quedar embarazada.  Si bebe alcohol: ? Limite la cantidad que bebe a lo siguiente:  De 0 a 1 medida por da para las mujeres.  De 0 a 2 medidas por da para los hombres. ? Est atento a la cantidad de alcohol que hay en las bebidas que toma. En los [place], una medida equivale a una botella de cerveza de 12oz (354ml), un vaso de vino de 5oz (147ml) o un vaso de una bebida alcohlica de alta graduacin de 1oz (34ml). Estilo de vida   Trabaje con su mdico para mantener un peso saludable o Administrator, Civil Service. Pregntele cul es el peso recomendado para usted.  Haga al menos 76minutos de ejercicio la [employer] de la [place]. Estas actividades pueden incluir caminar, nadar o andar en bicicleta.  Incluya ejercicios para fortalecer sus msculos (ejercicios de resistencia), como Pilates o levantamiento de pesas, como parte de su rutina semanal de ejercicios. Intente realizar 48minutos de este tipo de ejercicios al [employer] a la [place].  No consuma ningn producto que contenga nicotina o  tabaco, como cigarrillos, cigarrillos electrnicos y tabaco de Higher education careers adviser. Si necesita ayuda para dejar de fumar, consulte al mdico.  Contrlese la presin arterial en su casa segn las indicaciones del mdico.  Concurra a todas las visitas de seguimiento como se lo haya indicado el mdico. Esto es importante. Medicamentos  [employer] de venta libre y los recetados solamente como se lo haya indicado el mdico. Siga cuidadosamente las indicaciones. Los medicamentos para la presin arterial deben tomarse segn las indicaciones.  No omita las dosis de medicamentos para la presin arterial.  Si lo hace, estar en riesgo de tener problemas y puede hacer que los medicamentos sean menos eficaces.  Pregntele a su mdico a qu efectos secundarios o reacciones a los Careers information officer. Comunquese con un mdico si:  Piensa que tiene una reaccin a un medicamento que est tomando.  Tiene dolores de cabeza frecuentes (recurrentes).  Se siente mareado.  Tiene hinchazn en los tobillos.  Tiene problemas de visin. Solicite ayuda inmediatamente si:  Siente un dolor de cabeza intenso o confusin.  Siente debilidad inusual o adormecimiento.  Siente que va a desmayarse.  Siente un dolor intenso en el pecho o el abdomen.  Vomita repetidas veces.  Tiene dificultad para respirar. Resumen  La hipertensin se produce cuando la sangre bombea en las arterias con mucha fuerza. Si esta afeccin no se controla, podra correr riesgo de tener complicaciones graves.  La presin arterial deseada puede variar en funcin de las enfermedades, la edad y otros factores personales. Para la [employer], una presin arterial normal es menor que 120/80.  La hipertensin se trata con cambios en el estilo de vida, medicamentos o una combinacin de [place]. Los [employer] Corporation estilo de vida incluyen prdida de peso, ingerir alimentos sanos, seguir una dieta baja en sodio, hacer ms ejercicio y Environmental consultant consumo de alcohol. Esta informacin no tiene Marine scientist el consejo del mdico. Asegrese de hacerle al mdico cualquier pregunta que tenga. Document Revised: 10/06/2017 Document Reviewed: 10/06/2017 Elsevier Patient Education  [place].

## 2019-06-15 LAB — BASIC METABOLIC PANEL
BUN/Creatinine Ratio: 19 (ref 10–24)
BUN: 17 mg/dL (ref 8–27)
CO2: 24 mmol/L (ref 20–29)
Calcium: 9.9 mg/dL (ref 8.6–10.2)
Chloride: 100 mmol/L (ref 96–106)
Creatinine, Ser: 0.89 mg/dL (ref 0.76–1.27)
GFR calc Af Amer: 93 mL/min/{1.73_m2} (ref >59)
GFR calc non Af Amer: 81 mL/min/{1.73_m2} (ref >59)
Glucose: 86 mg/dL (ref 65–99)
Potassium: 3.9 mmol/L (ref 3.5–5.2)
Sodium: 140 mmol/L (ref 134–144)

## 2019-06-15 MED FILL — ?MECLIZINE 12.5 MG TABL: 12.5 | 10 days supply | Qty: 30 | Fill #0

## 2019-06-22 ENCOUNTER — Ambulatory Visit: Payer: Medicare Other | Admitting: Physical Therapy

## 2019-06-26 ENCOUNTER — Other Ambulatory Visit: Payer: Self-pay

## 2019-06-26 ENCOUNTER — Ambulatory Visit: Payer: Medicare Other | Admitting: Physical Therapy

## 2019-06-26 DIAGNOSIS — M25552 Pain in left hip: Secondary | ICD-10-CM

## 2019-06-26 DIAGNOSIS — G8929 Other chronic pain: Secondary | ICD-10-CM

## 2019-06-26 DIAGNOSIS — M25611 Stiffness of right shoulder, not elsewhere classified: Secondary | ICD-10-CM

## 2019-06-26 DIAGNOSIS — R296 Repeated falls: Secondary | ICD-10-CM

## 2019-06-26 DIAGNOSIS — R262 Difficulty in walking, not elsewhere classified: Secondary | ICD-10-CM

## 2019-06-26 DIAGNOSIS — M25511 Pain in right shoulder: Secondary | ICD-10-CM | POA: Diagnosis not present

## 2019-06-26 DIAGNOSIS — R293 Abnormal posture: Secondary | ICD-10-CM

## 2019-06-26 NOTE — Therapy (Signed)
[place], [place], [address] Phone: [phone]   Fax:  [phone]  Physical Therapy Treatment  Patient Details  Name: Patrick Ortega MRN: [id number] Date of Birth: [birth year] Referring Provider (PT): Eunice Blase MD   Encounter Date: 06/26/2019   PT End of Session - 06/26/19 1311    Visit Number 2    Number of Visits 13    Date for PT Re-Evaluation 07/19/19    Authorization Type [facility] ACO    PT Start Time 1315    PT Stop Time 1400    PT Time Calculation (min) 45 min    Activity Tolerance Patient tolerated treatment well    Behavior During Therapy [facility] for tasks assessed/performed           Past Medical History:  Diagnosis Date  . GERD (gastroesophageal reflux disease)   . Hypertension     Past Surgical History:  Procedure Laterality Date  . NO PAST SURGERIES      There were no vitals filed for this visit.   Subjective Assessment - 06/26/19 1321    Subjective Pt presents to clinic with granddaughter interpreting. Pt states he was able to do his HEP at home. Pt states he just wants the pain to improve.    Patient is accompained by: Family member;Interpreter   grandson and  interpreter Ticio   Pertinent History HTN, obesity, RA , OA, GERD    Limitations Lifting;Walking;Standing    How long can you sit comfortably? unlimited for shoulder    How long can you stand comfortably? 15 min    How long can you walk comfortably? 15 min    Patient Stated Goals I want to be able to move better  without pain    Currently in Pain? Yes    Pain Score 10-Worst pain ever    Pain Location Hip    Pain Type Chronic pain    Pain Onset More than a month ago    Pain Onset More than a month ago                             [facility] Adult PT Treatment/Exercise - 06/26/19 0001      Ambulation/Gait   Ambulation Distance (Feet) 100 Feet    Assistive device None    Gait Comments cueing to  decrease R shoulder hip hike and decreased L lateral lean; working on even step length and decreasing BOS      Lumbar Exercises: Stretches   Piriformis Stretch 30 seconds    Figure 4 Stretch 30 seconds      Lumbar Exercises: Aerobic   Nustep L4 x 5 min UE and LEs      Lumbar Exercises: Standing   Other Standing Lumbar Exercises Marching x 20 reps      Lumbar Exercises: Supine   Clam 20 reps    Bridge 20 reps    Other Supine Lumbar Exercises hip abduction x 10 reps      Shoulder Exercises: Sidelying   External Rotation Strengthening;Left;10 reps    ABduction Strengthening;Left;15 reps    Other Sidelying Exercises hamstring curl squeezing ball  x 10 reps    Other Sidelying Exercises extension on L x 10 reps      Modalities   Modalities Iontophoresis      Iontophoresis   Type of Iontophoresis Dexamethasone    Location Lt posterior hip    Dose 80 mA/min  Time 4 hrs      Manual Therapy   Manual Therapy Soft tissue mobilization;Myofascial release    Soft tissue mobilization IASTM and STW of glute, piriformis, ITB    Myofascial Release ITB                    PT Short Term Goals - 06/07/19 0838      PT SHORT TERM GOAL #1   Title STG=LTG             PT Long Term Goals - 06/07/19 0840      PT LONG TERM GOAL #1   Title Pt will be independent with all HEP given    Baseline no knowledge    Time 6    Period Weeks    Status New    Target Date 07/19/19      PT LONG TERM GOAL #2   Title Demonstrate at least  4/5 LT LE RT UE strength to improve stability, ability to carry groceries, safety and endurance of community level function    Baseline Pt RT UE 3-/5 and LT UE 3-/5/4-/5    Time 6    Period Weeks    Status New    Target Date 07/19/19      PT LONG TERM GOAL #3   Title PT with be able to walk/stand >/= 1 hour with no AD with </= 2/10 pain for functional endurance and return to leisure activities post DC    Baseline Pt hip pain 8/10    Time 6     Period Weeks    Status New    Target Date 07/19/19      PT LONG TERM GOAL #4   Title Pt will be able to ascend /descend steps without exacerbating pain and able to carry 20 lb or groceries    Baseline unable to ascend descend steps without UE support and one step at a time    Time 6    Period Weeks    Status New    Target Date 07/19/19      PT LONG TERM GOAL #5   Title Pt will be able to place 20 # above shoulders in order to carry and put away groceries and household items without exacerbating pain in RT shoulder    Baseline Pt pain in shoulder 6 /10    Time 6    Period Weeks    Status New    Target Date 07/19/19                 Plan - 06/26/19 1448    Clinical Impression Statement Pt complains mostly of Lt hip pain; therefore, treatment focused on manual work, stretching and strengthening of Lt hip. Pt provided gait training to discuss improved BOS and decreased trunk leaning -- pt hikes R shoulder during gait which may be a contributing cause to his Rt shoulder pain. Provided ionto for his hip pain.    Personal Factors and Comorbidities Comorbidity 1;Comorbidity 2;Age;Education    Comorbidities HTN, obesity, RA , OA, GERD, 3 falls in 6 months due to Medication ?by grandson and pt    Examination-Activity Limitations Squat;Stairs;Stand;Lift;Carry;Locomotion Level    Stability/Clinical Decision Making Evolving/Moderate complexity    Clinical Decision Making Moderate    Rehab Potential Good    PT Frequency 2x / week    PT Duration 6 weeks    PT Treatment/Interventions ADLs/Self Care Home Management;Cryotherapy;Electrical Stimulation;Iontophoresis 4mg /ml Dexamethasone;Moist Heat;Ultrasound;Balance training;Therapeutic exercise;Therapeutic activities;Functional mobility training;Stair training;Gait  training;Neuromuscular re-education;Patient/family education;Passive range of motion;Manual techniques;Dry needling;Taping;Joint Manipulations    PT Next Visit Plan Manual therapy as  indicated for Rt shoulder & Lt hip; continue to stretch and strengthen left hip. Consider inclusion of more standing exercises, gait training, and steps.    PT Home Exercise Plan [id number]    Consulted and Agree with Plan of Care Patient           Patient will benefit from skilled therapeutic intervention in order to improve the following deficits and impairments:  Pain, Obesity, Impaired UE functional use, Improper body mechanics, Postural dysfunction, Impaired flexibility, Hypomobility, Decreased strength, Decreased range of motion, Dizziness, Decreased balance  Visit Diagnosis: Chronic right shoulder pain  Pain in left hip  Stiffness of right shoulder, not elsewhere classified  Abnormal posture  Repeated falls  Difficulty in walking, not elsewhere classified     Problem List Patient Active Problem List   Diagnosis Date Noted  . Rheumatoid arthritis with positive rheumatoid factor ([place]) 08/12/2016  . Essential hypertension 05/24/2014  . Lymphocytosis 05/24/2014  . Osteoarthritis of left hip 05/24/2014    [facility] [address]  PT, DPT 06/26/2019, 2:53 PM  [facility] [address] [place], [place], [address] Phone: [phone]   Fax:  [phone]  Name: Ruger Saxer MRN: [id number] Date of Birth: [birth year]

## 2019-06-28 ENCOUNTER — Encounter: Payer: Self-pay | Admitting: Physical Therapy

## 2019-06-28 ENCOUNTER — Other Ambulatory Visit: Payer: Self-pay

## 2019-06-28 ENCOUNTER — Ambulatory Visit: Payer: Medicare Other | Admitting: Physical Therapy

## 2019-06-28 DIAGNOSIS — M25611 Stiffness of right shoulder, not elsewhere classified: Secondary | ICD-10-CM

## 2019-06-28 DIAGNOSIS — R262 Difficulty in walking, not elsewhere classified: Secondary | ICD-10-CM

## 2019-06-28 DIAGNOSIS — M25511 Pain in right shoulder: Secondary | ICD-10-CM | POA: Diagnosis not present

## 2019-06-28 DIAGNOSIS — R296 Repeated falls: Secondary | ICD-10-CM

## 2019-06-28 DIAGNOSIS — M25552 Pain in left hip: Secondary | ICD-10-CM

## 2019-06-28 DIAGNOSIS — G8929 Other chronic pain: Secondary | ICD-10-CM

## 2019-06-28 DIAGNOSIS — R293 Abnormal posture: Secondary | ICD-10-CM

## 2019-06-28 MED FILL — ?CHLORTHALIDONE 25 MG TABLE: 25 | 30 days supply | Qty: 30 | Fill #0

## 2019-06-28 NOTE — Therapy (Signed)
[place], [place], [address] Phone: [phone]   Fax:  [phone]  Physical Therapy Treatment  Patient Details  Name: Patrick Ortega MRN: [id number] Date of Birth: [birth year] Referring Provider (PT): Eunice Blase MD   Encounter Date: 06/28/2019   PT End of Session - 06/28/19 1138    Visit Number 3    Number of Visits 13    Date for PT Re-Evaluation 07/19/19    Authorization Type [employer]    PT Start Time 0800    PT Stop Time 0910    PT Time Calculation (min) 70 min    Activity Tolerance Patient tolerated treatment well    Behavior During Therapy [facility] for tasks assessed/performed           Past Medical History:  Diagnosis Date   GERD (gastroesophageal reflux disease)    Hypertension     Past Surgical History:  Procedure Laterality Date   NO PAST SURGERIES      There were no vitals filed for this visit.   Subjective Assessment - 06/28/19 0810    Subjective Pt presents to clinic wit granddte interpreting.  Pt doing HEP but with tender LT hip and antalgic gait.  His shoulder and LT hip hurt    Pertinent History HTN, obesity, RA , OA, GERD    Limitations Lifting;Walking;Standing    Currently in Pain? Yes    Pain Score 8     Pain Location Hip    Pain Orientation Left;Right    Pain Descriptors / Indicators Aching    Pain Type Chronic pain    Pain Onset More than a month ago    Pain Frequency Intermittent    Pain Score 8    Pain Location Hip    Pain Orientation Left    Pain Descriptors / Indicators Aching;Sharp    Pain Type Chronic pain    Pain Onset More than a month ago    Pain Frequency Constant                             [facility] Adult PT Treatment/Exercise - 06/28/19 0001      Self-Care   Self-Care Other Self-Care Comments    Other Self-Care Comments  education on TPDN and aftercare and precautians.        Lumbar Exercises: Stretches   Piriformis Stretch  30 seconds    Figure 4 Stretch 30 seconds      Lumbar Exercises: Standing   Other Standing Lumbar Exercises Marching x 20 reps      Lumbar Exercises: Supine   Bridge 20 reps    Other Supine Lumbar Exercises hip abduction x 10 reps      Lumbar Exercises: Sidelying   Clam Left;10 reps;3 seconds    Clam Limitations red t band x 2      Modalities   Modalities Moist Heat      Moist Heat Therapy   Number Minutes Moist Heat 15 Minutes    Moist Heat Location Shoulder;Cervical;Hip   LT hip and bil shoulder and neck     Manual Therapy   Manual Therapy Joint mobilization;Soft tissue mobilization    Manual therapy comments skilled palpation for TPDN    Joint Mobilization LAD and mob PA prone IRER of LT hip    Soft tissue mobilization Left hip and gluteal mx             Trigger Point  Dry Needling - 06/28/19 0001    Consent Given? Yes    Education Handout Provided Yes   granddtr Vaughan Basta interprets   Muscles Treated Head and Neck Upper trapezius;Levator scapulae   bil   Muscles Treated Upper Quadrant Subscapularis   bil   Muscles Treated Back/Hip Gluteus minimus;Gluteus medius;Gluteus maximus;Piriformis   LT only   Dry Needling Comments 100 mm .40 gage  39mm .30 gage    Upper Trapezius Response Twitch reponse elicited;Palpable increased muscle length    Levator Scapulae Response Twitch response elicited;Palpable increased muscle length    Subscapularis Response Twitch response elicited;Palpable increased muscle length    Gluteus Minimus Response Palpable increased muscle length    Gluteus Medius Response Twitch response elicited;Palpable increased muscle length    Gluteus Maximus Response Palpable increased muscle length    Piriformis Response Twitch response elicited;Palpable increased muscle length                PT Education - 06/28/19 1133    Education Details Granddaughter interprets TPDN aftercare and precautians and given Spanish handout.   updated HEP    Person(s)  Educated Patient;Child(ren)    Methods Explanation;Demonstration;Tactile cues;Verbal cues;Handout    Comprehension Verbalized understanding;Returned demonstration            PT Short Term Goals - 06/07/19 0838      PT SHORT TERM GOAL #1   Title STG=LTG             PT Long Term Goals - 06/28/19 1134      PT LONG TERM GOAL #1   Title Pt will be independent with all HEP given    Baseline Given intial HPE    Time 6    Period Weeks    Status On-going      PT LONG TERM GOAL #2   Title Demonstrate at least  4/5 LT LE RT UE strength to improve stability, ability to carry groceries, safety and endurance of community level function    Time 6    Period Weeks    Status On-going      PT LONG TERM GOAL #3   Title PT with be able to walk/stand >/= 1 hour with no AD with </= 2/10 pain for functional endurance and return to leisure activities post DC    Baseline Pt hip pain 8/10 not walking with AD but with antalgic gait    Time 6    Period Weeks    Status On-going      PT LONG TERM GOAL #4   Title Pt will be able to ascend /descend steps without exacerbating pain and able to carry 20 lb or groceries    Baseline unable to ascend descend steps without UE support and one step at a time    Time 6    Period Weeks    Status On-going      PT LONG TERM GOAL #5   Title Pt will be able to place 20 # above shoulders in order to carry and put away groceries and household items without exacerbating pain in RT shoulder    Baseline Pt pain in shoulder 6 /10                 Plan - 06/28/19 1138    Clinical Impression Statement Pt complains of bil shoulder and LT hip.  Walks without AD and antalgic left hip.  Pt has granddaughter to do Spanish interpreting and is explained in detail TPDN after care and  precautians.  Pt consents to TPDN and is closely monitored throughout session.  Pt with decrease in tissue spasm and pain at end of session with THer EX, manual and TPDN and moist heat.   Will Contiue with POC    Personal Factors and Comorbidities Comorbidity 1;Comorbidity 2;Age;Education    Comorbidities HTN, obesity, RA , OA, GERD, 3 falls in 6 months due to Medication ?by grandson and pt    Examination-Activity Limitations Squat;Stairs;Stand;Lift;Carry;Locomotion Level    Examination-Participation Restrictions Community Activity;Yard Work    PT Frequency 2x / week    PT Duration 6 weeks    PT Treatment/Interventions ADLs/Self Care Home Management;Cryotherapy;Electrical Stimulation;Iontophoresis 4mg /ml Dexamethasone;Moist Heat;Ultrasound;Balance training;Therapeutic exercise;Therapeutic activities;Functional mobility training;Stair training;Gait training;Neuromuscular re-education;Patient/family education;Passive range of motion;Manual techniques;Dry needling;Taping;Joint Manipulations    PT Next Visit Plan Manual therapy as indicated for Rt shoulder & Lt hip; continue to stretch and strengthen left hip. Consider inclusion of more standing exercises, gait training, and steps. assess TPDN    PT Home Exercise Plan [email or web address]    Consulted and Agree with Plan of Care Patient           Patient will benefit from skilled therapeutic intervention in order to improve the following deficits and impairments:  Pain, Obesity, Impaired UE functional use, Improper body mechanics, Postural dysfunction, Impaired flexibility, Hypomobility, Decreased strength, Decreased range of motion, Dizziness, Decreased balance  Visit Diagnosis: Chronic right shoulder pain  Pain in left hip  Stiffness of right shoulder, not elsewhere classified  Abnormal posture  Repeated falls  Difficulty in walking, not elsewhere classified     Problem List Patient Active Problem List   Diagnosis Date Noted   Rheumatoid arthritis with positive rheumatoid factor ([place]) 08/12/2016   Essential hypertension 05/24/2014   Lymphocytosis 05/24/2014   Osteoarthritis of left hip 05/24/2014    Voncille Lo,  PT Certified Exercise Expert for the Aging Adult  06/28/19 11:41 AM Phone: [phone] Fax: [place] [facility] [address] [place], [place], [address] Phone: [phone]   Fax:  [phone]  Name: Quindell Shere MRN: [id number] Date of Birth: 1940/02/23

## 2019-06-28 NOTE — Patient Instructions (Addendum)
     Voncille Lo, PT Certified Exercise Expert for the Aging Adult  06/28/19 8:05 AM Phone: [phone] Fax: [phone]

## 2019-07-03 ENCOUNTER — Ambulatory Visit: Payer: Medicare Other | Admitting: Physical Therapy

## 2019-07-05 ENCOUNTER — Ambulatory Visit: Payer: Medicare Other | Attending: Family Medicine | Admitting: Physical Therapy

## 2019-07-05 ENCOUNTER — Other Ambulatory Visit: Payer: Self-pay

## 2019-07-05 DIAGNOSIS — M25552 Pain in left hip: Secondary | ICD-10-CM | POA: Insufficient documentation

## 2019-07-05 DIAGNOSIS — G8929 Other chronic pain: Secondary | ICD-10-CM | POA: Diagnosis not present

## 2019-07-05 DIAGNOSIS — R262 Difficulty in walking, not elsewhere classified: Secondary | ICD-10-CM

## 2019-07-05 DIAGNOSIS — R293 Abnormal posture: Secondary | ICD-10-CM | POA: Diagnosis not present

## 2019-07-05 DIAGNOSIS — M25611 Stiffness of right shoulder, not elsewhere classified: Secondary | ICD-10-CM | POA: Diagnosis not present

## 2019-07-05 DIAGNOSIS — R296 Repeated falls: Secondary | ICD-10-CM | POA: Diagnosis not present

## 2019-07-05 DIAGNOSIS — M25511 Pain in right shoulder: Secondary | ICD-10-CM | POA: Insufficient documentation

## 2019-07-05 NOTE — Therapy (Signed)
[place], [place], [address] Phone: [phone]   Fax:  [phone]  Physical Therapy Treatment  Patient Details  Name: Patrick Ortega MRN: [id number] Date of Birth: October 01, 1940 Referring Provider (PT): Eunice Blase MD   Encounter Date: 07/05/2019   PT End of Session - 07/05/19 0802    Visit Number 4    Number of Visits 13    Date for PT Re-Evaluation 07/19/19    Authorization Type [employer]    PT Start Time 0800    Activity Tolerance Patient tolerated treatment well    Behavior During Therapy [facility] for tasks assessed/performed           Past Medical History:  Diagnosis Date   GERD (gastroesophageal reflux disease)    Hypertension     Past Surgical History:  Procedure Laterality Date   NO PAST SURGERIES      There were no vitals filed for this visit.   Subjective Assessment - 07/05/19 0816    Subjective I would prefer not to do needles today.    Patient is accompained by: Family member;Interpreter   dtr   Pertinent History HTN, obesity, RA , OA, GERD    Currently in Pain? Yes    Pain Score 6     Pain Location Hip    Pain Orientation Left    Pain Descriptors / Indicators Aching    Pain Type Chronic pain    Pain Onset More than a month ago    Pain Frequency Intermittent    Multiple Pain Sites Yes    Pain Score 5    Pain Location Neck    Pain Orientation Right;Left    Pain Descriptors / Indicators Aching;Spasm    Pain Type Chronic pain    Pain Onset More than a month ago    Pain Frequency Intermittent              [facility] PT Assessment - 07/05/19 0001      Assessment   Medical Diagnosis chronic RT shld pain, LT hip pain    Referring Provider (PT) Hilts, Michael MD      Observation/Other Assessments   Focus on Therapeutic Outcomes (FOTO)  NA  language      Functional Tests   Functional tests Squat;Sit to Stand;Other      Squat   Comments Pt squatting to 75 dgree flexi  with increased symmetry than eval but still on RT      Sit to Stand   Comments 5 x STS 8.23 sec   greater than 13 sec risk of fall                        [facility] Adult PT Treatment/Exercise - 07/05/19 0001      Lumbar Exercises: Standing   Other Standing Lumbar Exercises Marching x 20 reps      Knee/Hip Exercises: Standing   Lateral Step Up Right;Left;2 sets;10 reps    Forward Step Up Right;Left;2 sets;10 reps;Hand Hold: 2                    PT Short Term Goals - 06/07/19 0838      PT SHORT TERM GOAL #1   Title STG=LTG             PT Long Term Goals - 06/28/19 1134      PT LONG TERM GOAL #1   Title Pt will be independent with all  HEP given    Baseline Given intial HPE    Time 6    Period Weeks    Status On-going      PT LONG TERM GOAL #2   Title Demonstrate at least  4/5 LT LE RT UE strength to improve stability, ability to carry groceries, safety and endurance of community level function    Time 6    Period Weeks    Status On-going      PT LONG TERM GOAL #3   Title PT with be able to walk/stand >/= 1 hour with no AD with </= 2/10 pain for functional endurance and return to leisure activities post DC    Baseline Pt hip pain 8/10 not walking with AD but with antalgic gait    Time 6    Period Weeks    Status On-going      PT LONG TERM GOAL #4   Title Pt will be able to ascend /descend steps without exacerbating pain and able to carry 20 lb or groceries    Baseline unable to ascend descend steps without UE support and one step at a time    Time 6    Period Weeks    Status On-going      PT LONG TERM GOAL #5   Title Pt will be able to place 20 # above shoulders in order to carry and put away groceries and household items without exacerbating pain in RT shoulder    Baseline Pt pain in shoulder 6 /10            Access Code: [email or web address]: [email or web address]: 07/01/2021Prepared by: Donnetta Simpers BeardsleyExercises  Sit to  Stand with Arm Reach Toward Target - 1 x daily - 7 x weekly - 3 sets - 10 reps  Lunge with Counter Support - 1 x daily - 7 x weekly - 3 sets - 10 reps  Runner's Step Up/Down - 1 x daily - 7 x weekly - 3 sets - 10 reps  Lateral Step Up with Unilateral Counter Support - 1 x daily - 7 x weekly - 3 sets - 10 reps       Plan - 07/05/19 0802    PT Treatment/Interventions ADLs/Self Care Home Management;Cryotherapy;Electrical Stimulation;Iontophoresis 4mg /ml Dexamethasone;Moist Heat;Ultrasound;Balance training;Therapeutic exercise;Therapeutic activities;Functional mobility training;Stair training;Gait training;Neuromuscular re-education;Patient/family education;Passive range of motion;Manual techniques;Dry needling;Taping;Joint Manipulations           Patient will benefit from skilled therapeutic intervention in order to improve the following deficits and impairments:     Visit Diagnosis: Chronic right shoulder pain  Pain in left hip  Stiffness of right shoulder, not elsewhere classified  Abnormal posture  Repeated falls  Difficulty in walking, not elsewhere classified     Problem List Patient Active Problem List   Diagnosis Date Noted   Rheumatoid arthritis with positive rheumatoid factor ([place]) 08/12/2016   Essential hypertension 05/24/2014   Lymphocytosis 05/24/2014   Osteoarthritis of left hip 05/24/2014    Dorothea Ogle 07/05/2019, 8:32 AM  [facility] [address], [place], [address] Phone: [phone]   Fax:  [phone]  Name: Patrick Ortega MRN: [id number] Date of Birth: 08-Sep-1940

## 2019-07-05 NOTE — Patient Instructions (Signed)
   Patrick Ortega, PT Certified Exercise Expert for the Aging Adult  07/05/19 8:34 AM Phone: [phone] Fax: [phone]

## 2019-07-06 ENCOUNTER — Encounter: Payer: Self-pay | Admitting: *Deleted

## 2019-07-10 ENCOUNTER — Encounter: Payer: Medicare Other | Admitting: Physical Therapy

## 2019-07-12 ENCOUNTER — Other Ambulatory Visit: Payer: Self-pay

## 2019-07-12 ENCOUNTER — Ambulatory Visit: Payer: Medicare Other | Admitting: Physical Therapy

## 2019-07-12 DIAGNOSIS — R293 Abnormal posture: Secondary | ICD-10-CM | POA: Diagnosis not present

## 2019-07-12 DIAGNOSIS — M25511 Pain in right shoulder: Secondary | ICD-10-CM | POA: Diagnosis not present

## 2019-07-12 DIAGNOSIS — M25552 Pain in left hip: Secondary | ICD-10-CM

## 2019-07-12 DIAGNOSIS — M25611 Stiffness of right shoulder, not elsewhere classified: Secondary | ICD-10-CM

## 2019-07-12 DIAGNOSIS — R296 Repeated falls: Secondary | ICD-10-CM

## 2019-07-12 DIAGNOSIS — G8929 Other chronic pain: Secondary | ICD-10-CM | POA: Diagnosis not present

## 2019-07-12 DIAGNOSIS — R262 Difficulty in walking, not elsewhere classified: Secondary | ICD-10-CM

## 2019-07-12 NOTE — Therapy (Signed)
[place], [place], [address] Phone: [phone]   Fax:  [phone]  Physical Therapy Treatment  Patient Details  Name: Patrick Ortega MRN: [id number] Date of Birth: 1940/04/03 Referring Provider (PT): Eunice Blase MD   Encounter Date: 07/12/2019   PT End of Session - 07/12/19 0933    Visit Number 5    Number of Visits 13    Date for PT Re-Evaluation 07/19/19    Authorization Type [employer] A[employer]    PT Start Time 0837    PT Stop Time 0922    PT Time Calculation (min) 45 min    Activity Tolerance Patient tolerated treatment well    Behavior During Therapy [facility] for tasks assessed/performed           Past Medical History:  Diagnosis Date  . GERD (gastroesophageal reflux disease)   . Hypertension     Past Surgical History:  Procedure Laterality Date  . NO PAST SURGERIES      There were no vitals filed for this visit.   Subjective Assessment - 07/12/19 0929    Subjective Pt reports no new issues. States things feel about the same. Granddaughter notes that she notices a difference after needling.    Patient is accompained by: Family member;Interpreter   dtr   Pertinent History HTN, obesity, RA , OA, GERD    Limitations Lifting;Walking;Standing    How long can you sit comfortably? unlimited for shoulder    How long can you stand comfortably? 30 minutes    How long can you walk comfortably? 30 min    Patient Stated Goals I want to be able to move better  without pain    Currently in Pain? Yes    Pain Location Hip    Pain Onset More than a month ago    Multiple Pain Sites Yes    Pain Location Neck    Pain Orientation Right;Left    Pain Onset More than a month ago    Pain Location Shoulder    Pain Orientation Left;Right    Pain Onset More than a month ago                             [facility] Adult PT Treatment/Exercise - 07/12/19 0001      Neck Exercises: Supine   Neck Retraction  10 reps;3 secs    Capital Flexion 10 reps   AAROM     Shoulder Exercises: Standing   External Rotation Strengthening;Both;Theraband;20 reps   attempted green but too difficult; yellow better   Theraband Level (Shoulder External Rotation) Level 1 (Yellow)    Row Strengthening;Theraband;20 reps    Theraband Level (Shoulder Row) Level 4 (Blue)    Other Standing Exercises Cervical retraction with shoulder retraction x 10      Shoulder Exercises: Stretch   Other Shoulder Stretches Seated lat stretch x 30 sec      Manual Therapy   Manual Therapy Joint mobilization;Soft tissue mobilization    Joint Mobilization GH glides inferior and posterior grade II to III; manual cervical traction    Soft tissue mobilization Pecs, occipitals, cervical paraspinals, upper trap      Neck Exercises: Stretches   Other Neck Stretches seated cervical self traction 2x 30 sec    Other Neck Stretches seated assisted cervical rotation with towel 2x 30 sec  PT Education - 07/12/19 0931    Education Details Discussed improving posture with neck and shoulder retraction.    Person(s) Educated Patient    Methods Explanation;Demonstration;Tactile cues;Verbal cues    Comprehension Verbalized understanding;Returned demonstration;Verbal cues required            PT Short Term Goals - 07/05/19 0840      PT SHORT TERM GOAL #1   Title STG=LTG             PT Long Term Goals - 07/05/19 0840      PT LONG TERM GOAL #1   Title Pt will be independent with all HEP given    Baseline Pt given advanced standing for hip today    Time 6    Period Weeks    Status On-going      PT LONG TERM GOAL #2   Title Demonstrate at least  4/5 LT LE RT UE strength to improve stability, ability to carry groceries, safety and endurance of community level function    Baseline UE 4-/5 or better,  LE 4/5 or better    Time 6    Period Weeks    Status Partially Met      PT LONG TERM GOAL #3   Title PT with  be able to walk/stand >/= 1 hour with no AD with </= 2/10 pain for functional endurance and return to leisure activities post DC    Baseline Pt hip pain 6/10 not walking with AD but gait better and more normalized with more weight bearing in LT    Time 6    Period Weeks    Status On-going      PT LONG TERM GOAL #4   Title Pt will be able to ascend /descend steps without exacerbating pain and able to carry 20 lb or groceries    Baseline able to ascend descend steps without UE support and one step at a time    Time 6    Period Weeks      PT LONG TERM GOAL #5   Title Pt will be able to place 20 # above shoulders in order to carry and put away groceries and household items without exacerbating pain in RT shoulder    Baseline Pt able to lift 3lb over head    Time 6    Period Weeks    Status On-going                 Plan - 07/12/19 0934    Clinical Impression Statement This treatment session focused primarily on neck and shoulder posture and strengthening for improved shoulder mechanics for lifting. Initiated cervical and scapular retraction, provided neck stretches and self mobilization for home and added periscapular strengthening exercises. Educated pt about posture.    Personal Factors and Comorbidities Comorbidity 1;Comorbidity 2;Age;Education    Comorbidities HTN, obesity, RA , OA, GERD, 3 falls in 6 months due to Medication ?by grandson and pt    Examination-Activity Limitations Squat;Stairs;Stand;Lift;Carry;Locomotion Level    Examination-Participation Restrictions Community Activity;Yard Work    PT Frequency 2x / week    PT Duration 6 weeks    PT Treatment/Interventions ADLs/Self Care Home Management;Cryotherapy;Electrical Stimulation;Iontophoresis 12m/ml Dexamethasone;Moist Heat;Ultrasound;Balance training;Therapeutic exercise;Therapeutic activities;Functional mobility training;Stair training;Gait training;Neuromuscular re-education;Patient/family education;Passive range of  motion;Manual techniques;Dry needling;Taping;Joint Manipulations    PT Next Visit Plan Consider inclusion of more standing exercises, gait training, and steps. Assess response to addition of neck/shoulder exercises.    PT Home Exercise Plan [email or web address]  forward  and lateral step ups  [email or web address]    Consulted and Agree with Plan of Care Patient;Family member/caregiver    Family Member Consulted Granddaughter interpreting           Patient will benefit from skilled therapeutic intervention in order to improve the following deficits and impairments:  Pain, Obesity, Impaired UE functional use, Improper body mechanics, Postural dysfunction, Impaired flexibility, Hypomobility, Decreased strength, Decreased range of motion, Dizziness, Decreased balance  Visit Diagnosis: Chronic right shoulder pain  Pain in left hip  Stiffness of right shoulder, not elsewhere classified  Abnormal posture  Repeated falls  Difficulty in walking, not elsewhere classified     Problem List Patient Active Problem List   Diagnosis Date Noted  . Rheumatoid arthritis with positive rheumatoid factor ([place]) 08/12/2016  . Essential hypertension 05/24/2014  . Lymphocytosis 05/24/2014  . Osteoarthritis of left hip 05/24/2014    [facility] [address] PT, DPT 07/12/2019, 9:43 AM  [facility] [address] [place], [place], [address] Phone: [phone]   Fax:  [phone]  Name: Antawan Mchugh MRN: [id number] Date of Birth: 1940-05-23

## 2019-07-19 ENCOUNTER — Ambulatory Visit: Payer: Medicare Other | Admitting: Physical Therapy

## 2019-07-19 ENCOUNTER — Other Ambulatory Visit: Payer: Self-pay

## 2019-07-19 DIAGNOSIS — R296 Repeated falls: Secondary | ICD-10-CM

## 2019-07-19 DIAGNOSIS — M25511 Pain in right shoulder: Secondary | ICD-10-CM | POA: Diagnosis not present

## 2019-07-19 DIAGNOSIS — M25611 Stiffness of right shoulder, not elsewhere classified: Secondary | ICD-10-CM | POA: Diagnosis not present

## 2019-07-19 DIAGNOSIS — G8929 Other chronic pain: Secondary | ICD-10-CM

## 2019-07-19 DIAGNOSIS — R262 Difficulty in walking, not elsewhere classified: Secondary | ICD-10-CM

## 2019-07-19 DIAGNOSIS — M25552 Pain in left hip: Secondary | ICD-10-CM

## 2019-07-19 DIAGNOSIS — R293 Abnormal posture: Secondary | ICD-10-CM | POA: Diagnosis not present

## 2019-07-19 NOTE — Therapy (Signed)
[place], [place], [address] Phone: [phone]   Fax:  [phone]  Physical Therapy Treatment/Discharge Note  Patient Details  Name: Patrick Ortega MRN: [id number] Date of Birth: November 07, 1940 Referring Provider (PT): Eunice Blase MD   Encounter Date: 07/19/2019   PT End of Session - 07/19/19 0949    Visit Number 6    Number of Visits 13    Date for PT Re-Evaluation 07/19/19    Authorization Type [employer]    PT Start Time 0847    PT Stop Time 0946    PT Time Calculation (min) 59 min    Activity Tolerance Patient tolerated treatment well    Behavior During Therapy [facility] for tasks assessed/performed           Past Medical History:  Diagnosis Date  . GERD (gastroesophageal reflux disease)   . Hypertension     Past Surgical History:  Procedure Laterality Date  . NO PAST SURGERIES      There were no vitals filed for this visit.   Subjective Assessment - 07/19/19 0856    Subjective TPDN did help and I would like it one more time.  I can go up and down steps without my hands if i have to. I am doing well.  I do my exericises at home    Patient is accompained by: Family member;Interpreter    Pertinent History HTN, obesity, RA , OA, GERD    Limitations Lifting;Walking;Standing    How long can you sit comfortably? unlimited for shoulder    Pain Score 3     Pain Location Hip    Pain Orientation Left    Pain Score 3    Pain Location Neck    Pain Orientation Right;Left    Pain Descriptors / Indicators Aching;Spasm              [facility] PT Assessment - 07/19/19 0001      Assessment   Medical Diagnosis chronic RT shld pain, LT hip pain    Referring Provider (PT) Hilts, Michael MD      Observation/Other Assessments   Focus on Therapeutic Outcomes (FOTO)  NA  language      Functional Tests   Functional tests Squat;Sit to Stand;Other      Squat   Comments pt able to lunge bil with back knee  to ground and rise from floor      Sit to Stand   Comments 5 x STS 8.10 sec   greater than 13 sec risk of fall     AROM   Overall AROM  Deficits    Overall AROM Comments Cervical WFL    Right Shoulder Extension 30 Degrees    Right Shoulder Flexion 145 Degrees   PROM 149   Right Shoulder ABduction 120 Degrees   124   Right Shoulder Internal Rotation --   thumb to to T12   Right Shoulder External Rotation --   thumb to C-5   Left Shoulder Extension 30 Degrees    Left Shoulder Flexion 152 Degrees   PROM 166   Left Shoulder ABduction 147 Degrees    Left Shoulder Internal Rotation --   t-9   Left Shoulder External Rotation --    C-6   Right Hip Flexion 120    Right Hip External Rotation  40    Right Hip Internal Rotation  25    Left Hip Flexion 110    Left Hip External Rotation  36    Left Hip Internal Rotation  25      Strength   Overall Strength Deficits    Right Shoulder Flexion 4/5    Right Shoulder ABduction 4/5    Right Shoulder Internal Rotation 4+/5    Right Shoulder External Rotation 4/5    Left Shoulder Flexion 5/5    Left Shoulder ABduction 5/5    Left Shoulder Internal Rotation 5/5    Left Shoulder External Rotation 5/5    Right Hip Flexion 4+/5    Right Hip Extension 4/5    Right Hip ABduction 4/5    Left Hip Flexion 4+/5    Left Hip Extension 4/5    Left Hip ABduction 4/5    Right Knee Flexion 4+/5    Right Knee Extension 4+/5    Left Knee Flexion 4+/5    Left Knee Extension 4+/5      Transfers   Five time sit to stand comments  8.10 sec                         [facility] Adult PT Treatment/Exercise - 07/19/19 0001      Ambulation/Gait   Stairs Yes    Stairs Assistance 7: Independent    Stair Management Technique No rails;Alternating pattern    Number of Stairs 16    Height of Stairs 6    Gait Comments pt is ambulating with more even stride and decreased antalgic gait  now able to go up and down steps without UE support and able to carry  20# groceries      Self-Care   Self-Care Other Self-Care Comments    Other Self-Care Comments  education on sleep hygiene and community wellness       Neck Exercises: Supine   Neck Retraction 10 reps;3 secs    Capital Flexion 10 reps      Lumbar Exercises: Standing   Forward Lunge 10 reps    Forward Lunge Limitations Rt and LT with chair hold    Side Lunge 10 reps    Side Lunge Limitations RT and LT with chair hold    Other Standing Lumbar Exercises Marching x 20 reps    Other Standing Lumbar Exercises sink squat using chair 2 x 10        Lumbar Exercises: Supine   Bridge 20 reps      Knee/Hip Exercises: Standing   Lateral Step Up Right;Left;2 sets;10 reps    Forward Step Up Right;Left;2 sets;10 reps;Hand Hold: 2      Shoulder Exercises: Standing   External Rotation Strengthening;Both;Theraband;20 reps   attempted green but too difficult; yellow better   Theraband Level (Shoulder External Rotation) Level 2 (Red)    Row Strengthening;Theraband;20 reps    Theraband Level (Shoulder Row) Level 4 (Blue)    Other Standing Exercises Cervical retraction with shoulder retraction x 10      Moist Heat Therapy   Number Minutes Moist Heat 15 Minutes    Moist Heat Location Shoulder;Cervical      Iontophoresis   Type of Iontophoresis --   bil     Manual Therapy   Manual Therapy Joint mobilization;Soft tissue mobilization    Manual therapy comments skilled palpation for TPDN    Joint Mobilization GH glides inferior and posterior grade II to III; manual cervical traction   imporved after TPDN   Soft tissue mobilization Pecs, occipitals, cervical paraspinals, upper trap      Neck Exercises: Stretches  Upper Trapezius Stretch Right;Left;2 reps;30 seconds    Levator Stretch Right;Left;2 reps;30 seconds            Trigger Point Dry Needling - 07/19/19 0001    Consent Given? Yes    Muscles Treated Head and Neck Upper trapezius;Suboccipitals;Levator scapulae;Cervical multifidi   bil     Muscles Treated Upper Quadrant Subscapularis   bil   Upper Trapezius Response Twitch reponse elicited;Palpable increased muscle length    Suboccipitals Response Twitch response elicited;Palpable increased muscle length    Levator Scapulae Response Twitch response elicited;Palpable increased muscle length    Cervical multifidi Response Palpable increased muscle length   C-2 to C-4   Subscapularis Response Twitch response elicited;Palpable increased muscle length                PT Education - 07/19/19 0928    Education Details educated on sleep tips and community wellness and reviewed HEP    Person(s) Educated Patient    Methods Explanation;Demonstration;Verbal cues;Handout;Tactile cues    Comprehension Verbalized understanding;Returned demonstration            PT Short Term Goals - 07/05/19 0840      PT SHORT TERM GOAL #1   Title STG=LTG             PT Long Term Goals - 07/19/19 0850      PT LONG TERM GOAL #1   Title Pt will be independent with all HEP given    Baseline Pt given advanced standing for hip today    Time 6    Period Weeks    Status Achieved      PT LONG TERM GOAL #2   Title Demonstrate at least  4/5 LT LE RT UE strength to improve stability, ability to carry groceries, safety and endurance of community level function    Baseline UE at least 4/5 and LE 4/5 /4+5    Time 6    Period Weeks    Status Achieved      PT LONG TERM GOAL #3   Title PT with be able to walk/stand >/= 1 hour with no AD with </= 2/10 pain for functional endurance and return to leisure activities post DC    Baseline Pt no longer requires AD  with pain down to 3/10    Time 6    Period Weeks    Status Partially Met      PT LONG TERM GOAL #4   Title Pt will be able to ascend /descend steps without exacerbating pain and able to carry 20 lb or groceries    Baseline able to ascend descend steps without UE support alternating steps and able to carry groceries according to daughter     Time 6    Status Achieved      PT LONG TERM GOAL #5   Title Pt will be able to place 20 # above shoulders in order to carry and put away groceries and household items without exacerbating pain in RT shoulder    Baseline Pt able to use 2 hands to lift 20# weight overhead    Time 6    Period Weeks    Status Achieved                 Plan - 07/19/19 0941    Clinical Impression Statement Pt / dtr Spanish interpreter presents to clininc with 3/10 pain in shoulders and hips today andmuch improved gait.  Pt 5 x STS 8.10 sec showing improved  LE strength. Pt has achieved or partially met all goals and is compliant and doing exercise HEP at home consistently.  Pt is able to walk without an assistive device and decreased antalgic gait from eval.  He is able to lunge and perform floor to stand transfer without  help.  Pt has made great improvement with RX and TPDN which he consented to today and was closely monitored.  He is doing well and ready for DC.    Personal Factors and Comorbidities Comorbidity 1;Comorbidity 2;Age;Education    Comorbidities HTN, obesity, RA , OA, GERD, 3 falls in 6 months due to Medication ?by grandson and pt    Examination-Activity Limitations Squat;Stairs;Stand;Lift;Carry;Locomotion Level    Examination-Participation Restrictions Community Activity;Yard Work    Rehab Potential Good    PT Frequency 2x / week    PT Duration 6 weeks    PT Treatment/Interventions ADLs/Self Care Home Management;Cryotherapy;Electrical Stimulation;Iontophoresis 32m/ml Dexamethasone;Moist Heat;Ultrasound;Balance training;Therapeutic exercise;Therapeutic activities;Functional mobility training;Stair training;Gait training;Neuromuscular re-education;Patient/family education;Passive range of motion;Manual techniques;Dry needling;Taping;Joint Manipulations    PT Next Visit Plan DC    PT Home Exercise Plan [id number]  forward and lateral step ups  [id number]    Consulted and Agree with Plan of Care  Patient;Family member/caregiver           Patient will benefit from skilled therapeutic intervention in order to improve the following deficits and impairments:  Pain, Obesity, Impaired UE functional use, Improper body mechanics, Postural dysfunction, Impaired flexibility, Hypomobility, Decreased strength, Decreased range of motion, Dizziness, Decreased balance  Visit Diagnosis: Chronic right shoulder pain  Pain in left hip  Stiffness of right shoulder, not elsewhere classified  Abnormal posture  Repeated falls  Difficulty in walking, not elsewhere classified     Problem List Patient Active Problem List   Diagnosis Date Noted  . Rheumatoid arthritis with positive rheumatoid factor ([place] 08/12/2016  . Essential hypertension 05/24/2014  . Lymphocytosis 05/24/2014  . Osteoarthritis of left hip 05/24/2014  LVoncille Lo PT Certified Exercise Expert for the Aging Adult  07/19/19 9:52 AM Phone: [phone]: 3[facility] [place] [address]Phone: [phone]  Fax:  [phone] Name: AGwin EagonMRN: [id number] of Birth: [birth year] PHYSICAL THERAPY DISCHARGE SUMMARY  Visits from Start of Care: 6  Current functional level related to goals / functional outcomes: As above   Remaining deficits: Slight antalgia but able to walk without AD and lunge to ground and do floor to stand transfer  I   Education / Equipment: HEP and informaitjon on community wellness  Plan: Patient agrees to discharge.  Patient goals were met. Patient is being discharged due to meeting the stated rehab goals.  ?????    Pt/dtr pleased with progress.  LVoncille Lo PT Certified Exercise Expert for the Aging Adult  07/19/19 9:52 AM Phone: [phone]: [phone]

## 2019-07-19 NOTE — Patient Instructions (Signed)
Important walk and continue exericises.   Need 150 to 300 min a week  That is 30 to 45 min of walking or moderate exericise a day  Sleep Tips    .Keep a consistent sleep schedule. Get up at the same time every day, even on weekends or during vacations. .Set a bedtime that is early enough for you to get at least 7 hours of sleep. .Don't go to bed unless you are sleepy.  .If you don't fall asleep after 20 minutes, get out of bed.  .Establish a relaxing bedtime routine.  .Use your bed only for sleep and sex.  .Make your bedroom quiet and relaxing. Keep the room at a comfortable, cool temperature.  .Limit exposure to bright light in the evenings. .Turn off electronic devices at least 30 minutes before bedtime. .Don't eat a large meal before bedtime. If you are hungry at night, eat a light, healthy snack.  .Exercise regularly and maintain a healthy diet.  Marland KitchenAvoid consuming caffeine in the late afternoon or evening.  .Avoid consuming alcohol before bedtime.  .Reduce your fluid intake before bedtime.  Voncille Lo, PT Certified Exercise Expert for the Aging Adult  07/19/19 9:28 AM Phone: [phone] Fax: [phone]

## 2019-07-23 MED FILL — ?CHLORTHALIDONE 25 MG TABLE: 25 | 30 days supply | Qty: 30 | Fill #1

## 2019-08-20 ENCOUNTER — Ambulatory Visit: Payer: Medicare Other | Attending: Critical Care Medicine | Admitting: Critical Care Medicine

## 2019-08-20 NOTE — Progress Notes (Deleted)
° °  Subjective:    Patient ID: Patrick Ortega, male    DOB: 25-Feb-1940, 79 y.o.   MRN: [id number]  HPI    Review of Systems     Objective:   Physical Exam        Assessment & Plan:

## 2019-08-24 MED FILL — ?CHLORTHALIDONE 25 MG TABLE: 25 | 30 days supply | Qty: 30 | Fill #2

## 2019-09-20 ENCOUNTER — Ambulatory Visit: Payer: Medicare Other | Admitting: Critical Care Medicine

## 2019-09-26 ENCOUNTER — Other Ambulatory Visit: Payer: Self-pay | Admitting: Pharmacist

## 2019-09-26 ENCOUNTER — Other Ambulatory Visit: Payer: Self-pay | Admitting: Physician Assistant

## 2019-09-26 DIAGNOSIS — I10 Essential (primary) hypertension: Secondary | ICD-10-CM

## 2019-09-26 MED ORDER — CHLORTHALIDONE 25 MG PO TABS: 25.0000 mg | ORAL_TABLET | Freq: Every day | ORAL | 0 refills | Status: DC

## 2019-09-26 MED FILL — ?CHLORTHALIDONE 25 MG TABLE: 25 | 30 days supply | Qty: 30 | Fill #0

## 2019-10-12 ENCOUNTER — Encounter: Payer: Self-pay | Admitting: Family Medicine

## 2019-10-12 ENCOUNTER — Ambulatory Visit: Payer: Medicare Other | Attending: Critical Care Medicine | Admitting: Family Medicine

## 2019-10-12 ENCOUNTER — Other Ambulatory Visit: Payer: Self-pay | Admitting: Family Medicine

## 2019-10-12 ENCOUNTER — Other Ambulatory Visit: Payer: Self-pay

## 2019-10-12 VITALS — BP 154/79 | HR 68 | Temp 97.2°F | Wt 162.6 lb

## 2019-10-12 DIAGNOSIS — Z7901 Long term (current) use of anticoagulants: Secondary | ICD-10-CM | POA: Insufficient documentation

## 2019-10-12 DIAGNOSIS — M25511 Pain in right shoulder: Secondary | ICD-10-CM | POA: Insufficient documentation

## 2019-10-12 DIAGNOSIS — Z79899 Other long term (current) drug therapy: Secondary | ICD-10-CM | POA: Insufficient documentation

## 2019-10-12 DIAGNOSIS — G8929 Other chronic pain: Secondary | ICD-10-CM | POA: Diagnosis present

## 2019-10-12 DIAGNOSIS — Z789 Other specified health status: Secondary | ICD-10-CM

## 2019-10-12 DIAGNOSIS — M1612 Unilateral primary osteoarthritis, left hip: Secondary | ICD-10-CM

## 2019-10-12 DIAGNOSIS — M2550 Pain in unspecified joint: Secondary | ICD-10-CM | POA: Diagnosis not present

## 2019-10-12 DIAGNOSIS — M25552 Pain in left hip: Secondary | ICD-10-CM | POA: Insufficient documentation

## 2019-10-12 DIAGNOSIS — Z87891 Personal history of nicotine dependence: Secondary | ICD-10-CM | POA: Insufficient documentation

## 2019-10-12 DIAGNOSIS — I10 Essential (primary) hypertension: Secondary | ICD-10-CM

## 2019-10-12 DIAGNOSIS — R5383 Other fatigue: Secondary | ICD-10-CM | POA: Insufficient documentation

## 2019-10-12 DIAGNOSIS — Z791 Long term (current) use of non-steroidal anti-inflammatories (NSAID): Secondary | ICD-10-CM | POA: Diagnosis not present

## 2019-10-12 DIAGNOSIS — R252 Cramp and spasm: Secondary | ICD-10-CM | POA: Insufficient documentation

## 2019-10-12 DIAGNOSIS — M542 Cervicalgia: Secondary | ICD-10-CM | POA: Insufficient documentation

## 2019-10-12 DIAGNOSIS — M5412 Radiculopathy, cervical region: Secondary | ICD-10-CM

## 2019-10-12 DIAGNOSIS — Z603 Acculturation difficulty: Secondary | ICD-10-CM

## 2019-10-12 DIAGNOSIS — Z758 Other problems related to medical facilities and other health care: Secondary | ICD-10-CM

## 2019-10-12 DIAGNOSIS — M069 Rheumatoid arthritis, unspecified: Secondary | ICD-10-CM | POA: Insufficient documentation

## 2019-10-12 MED ORDER — GABAPENTIN 100 MG PO CAPS: 100.0000 mg | ORAL_CAPSULE | Freq: Every day | ORAL | 4 refills | Status: DC

## 2019-10-12 MED ORDER — MELOXICAM 15 MG PO TABS: ORAL_TABLET | ORAL | 5 refills | Status: DC

## 2019-10-12 MED ORDER — AMLODIPINE BESYLATE 5 MG PO TABS: 5.0000 mg | ORAL_TABLET | Freq: Every day | ORAL | 4 refills | Status: DC

## 2019-10-12 MED FILL — MELOXICAM 15 MG TABLET: 15 | 30 days supply | Qty: 30 | Fill #0

## 2019-10-12 MED FILL — ?AMLODIPINE BESYLATE 5MG TA: 5 | 30 days supply | Qty: 30 | Fill #0

## 2019-10-12 MED FILL — GABAPENTIN 100 MG CAPSULE: 100 | 30 days supply | Qty: 30 | Fill #0

## 2019-10-12 NOTE — Progress Notes (Signed)
BACK/NECK PAIN HANDS CRAMPING IN A.M.

## 2019-10-12 NOTE — Progress Notes (Signed)
Established Patient Office Visit  Subjective:  Patient ID: Patrick Ortega, male    DOB: 10-10-1940  Age: 79 y.o. MRN: [id number]  CC:  Chief Complaint  Patient presents with  . Pain    HPI Patrick Ortega, 79 year old male who presents in follow-up of hypertension and issues with chronic pain in the left hip and right shoulder.  He reports that he needs a refill of pain medication, meloxicam which does seem to help with his pain.  He continues to have issues with chronic hip pain which is worse with walking or when initially getting up from a seated position after he has been sitting for a while.  He additionally continues to have issues with shoulder pain.  He has had injections in his hip and shoulder by orthopedics without significant lasting relief.  He has also attended physical therapy which helped a little.         He has been out of medication for his blood pressure and needs a new prescription.  He has been monitoring his home blood pressure which has been elevated. Past Medical History:  Diagnosis Date  . GERD (gastroesophageal reflux disease)   . Hypertension     Past Surgical History:  Procedure Laterality Date  . NO PAST SURGERIES      No family history on file.  Social History   Socioeconomic History  . Marital status: Married    Spouse name: Not on file  . Number of children: Not on file  . Years of education: Not on file  . Highest education level: Not on file  Occupational History  . Not on file  Tobacco Use  . Smoking status: Former Smoker    Packs/day: 0.25    Years: 0.50    Pack years: 0.12    Types: Cigarettes  . Smokeless tobacco: Never Used  Substance and Sexual Activity  . Alcohol use: Yes    Alcohol/week: 24.0 standard drinks    Types: 24 Cans of beer per week  . Drug use: Not on file  . Sexual activity: Yes  Other Topics Concern  . Not on file  Social History Narrative  . Not on file   Social Determinants of Health   Financial  Resource Strain:   . Difficulty of Paying Living Expenses: Not on file  Food Insecurity:   . Worried About Charity fundraiser in the Last Year: Not on file  . Ran Out of Food in the Last Year: Not on file  Transportation Needs:   . Lack of Transportation (Medical): Not on file  . Lack of Transportation (Non-Medical): Not on file  Physical Activity:   . Days of Exercise per Week: Not on file  . Minutes of Exercise per Session: Not on file  Stress:   . Feeling of Stress : Not on file  Social Connections:   . Frequency of Communication with Friends and Family: Not on file  . Frequency of Social Gatherings with Friends and Family: Not on file  . Attends Religious Services: Not on file  . Active Member of Clubs or Organizations: Not on file  . Attends Archivist Meetings: Not on file  . Marital Status: Not on file  Intimate Partner Violence:   . Fear of Current or Ex-Partner: Not on file  . Emotionally Abused: Not on file  . Physically Abused: Not on file  . Sexually Abused: Not on file    Outpatient Medications Prior to Visit  Medication Sig Dispense  Refill  . chlorthalidone (HYGROTON) 25 MG tablet Take 1 tablet (25 mg total) by mouth daily. 90 tablet 0  . meclizine (ANTIVERT) 12.5 MG tablet Take 1 tablet (12.5 mg total) by mouth 3 (three) times daily as needed for dizziness. 30 tablet 1  . methotrexate (RHEUMATREX) 7.5 MG tablet TAKE ONE TABLET BY MOUTH ONCE A WEEK. 4 tablet 1  . Vitamin D, Ergocalciferol, (DRISDOL) 1.25 MG (50000 UNIT) CAPS capsule Take 1 capsule (50,000 Units total) by mouth every 7 (seven) days. 16 capsule 0  . amLODipine (NORVASC) 5 MG tablet Take 1 tablet (5 mg total) by mouth daily. 30 tablet 6  . meloxicam (MOBIC) 15 MG tablet TAKE 1 TABLET BY MOUTH ONCE DAILY AS NEEDED FOR PAIN 30 tablet 2   No facility-administered medications prior to visit.    No Known Allergies  ROS Review of Systems  Constitutional: Positive for fatigue. Negative for  chills and fever.  HENT: Negative for sore throat and trouble swallowing.   Respiratory: Negative for cough and shortness of breath.   Cardiovascular: Negative for chest pain and palpitations.  Gastrointestinal: Negative for abdominal pain, constipation, diarrhea and nausea.  Endocrine: Negative for polydipsia, polyphagia and polyuria.  Genitourinary: Negative for dysuria and frequency.  Musculoskeletal: Positive for arthralgias, back pain and gait problem.  Neurological: Negative for dizziness and headaches.  Hematological: Negative for adenopathy. Does not bruise/bleed easily.  Psychiatric/Behavioral: Negative for sleep disturbance. The patient is not nervous/anxious.       Objective:    Physical Exam Vitals and nursing note reviewed.  Constitutional:      Appearance: Normal appearance.  Neck:     Vascular: No carotid bruit.  Cardiovascular:     Rate and Rhythm: Normal rate and regular rhythm.  Pulmonary:     Effort: Pulmonary effort is normal.     Breath sounds: Normal breath sounds.  Abdominal:     Palpations: Abdomen is soft.     Tenderness: There is no abdominal tenderness. There is no right CVA tenderness, left CVA tenderness, guarding or rebound.     Comments: Mild truncal obesity  Musculoskeletal:        General: Tenderness (Tenderness over the left posterior hip and patient with difficulty arising from a seated position.  Patient with pain with range of motion of the right shoulder) present.     Cervical back: No tenderness.     Right lower leg: No edema.     Left lower leg: No edema.  Lymphadenopathy:     Cervical: No cervical adenopathy.  Skin:    General: Skin is warm and dry.  Neurological:     General: No focal deficit present.     Mental Status: He is alert and oriented to person, place, and time.  Psychiatric:        Mood and Affect: Mood normal.        Behavior: Behavior normal.     BP (!) 154/79   Pulse 68   Temp (!) 97.2 F (36.2 C)   Wt 162 lb  9.6 oz (73.8 kg)   SpO2 97%   BMI 30.72 kg/m  Wt Readings from Last 3 Encounters:  10/12/19 162 lb 9.6 oz (73.8 kg)  05/09/19 167 lb 9.6 oz (76 kg)  04/25/19 185 lb (83.9 kg)     Health Maintenance Due  Topic Date Due  . COVID-19 Vaccine (1) Never done  . TETANUS/TDAP  Never done  . PNA vac Low Risk Adult (1 of 2 -  PCV13) Never done  . INFLUENZA VACCINE  Never done     Lab Results  Component Value Date   TSH 2.080 04/25/2019   Lab Results  Component Value Date   WBC 12.1 (H) 05/09/2019   HGB 15.1 05/09/2019   HCT 46.1 05/09/2019   MCV 90 05/09/2019   PLT 437 05/09/2019   Lab Results  Component Value Date   NA 145 (H) 10/12/2019   K 4.2 10/12/2019   CO2 25 10/12/2019   GLUCOSE 82 10/12/2019   BUN 15 10/12/2019   CREATININE 0.92 10/12/2019   BILITOT 0.5 04/25/2019   ALKPHOS 103 04/25/2019   AST 25 04/25/2019   ALT 15 04/25/2019   PROT 7.7 04/25/2019   ALBUMIN 4.8 (H) 04/25/2019   CALCIUM 10.2 10/12/2019   Lab Results  Component Value Date   CHOL 195 05/09/2019   Lab Results  Component Value Date   HDL 64 05/09/2019   Lab Results  Component Value Date   LDLCALC 112 (H) 05/09/2019   Lab Results  Component Value Date   TRIG 110 05/09/2019   Lab Results  Component Value Date   CHOLHDL 3.0 05/09/2019   No results found for: HGBA1C    Assessment & Plan:  1. Uncontrolled hypertension Prescription for amlodipine 5 mg daily.  Appears that chlorthalidone was recently refilled.  Patient has home blood pressure monitor and should return to clinic/notify office if blood pressure is staying above 665/99. - Basic Metabolic Panel - amLODipine (NORVASC) 5 MG tablet; Take 1 tablet (5 mg total) by mouth daily. To lower blood pressure  Dispense: 30 tablet; Refill: 4  2. Chronic pain of multiple joints 3. Cervical radiculopathy 4. Osteoarthritis of left hip, unspecified osteoarthritis type Patient is encouraged to continue follow-up with his orthopedic doctor.   He has attended physical therapy for his chronic shoulder and hip pain.  Patient reports the meloxicam has helped with his chronic shoulder and hip pain and refill provided at today's visit.  He is aware to eat prior to taking the medication and that the medication may cause increased risk for GI bleed.  Patient also with history of rheumatoid arthritis for which she was on methotrexate in the past but stopped the use of this medication. - meloxicam (MOBIC) 15 MG tablet; TAKE 1 TABLET BY MOUTH ONCE DAILY AS NEEDED FOR PAIN  Dispense: 30 tablet; Refill: 5  5.  Language barrier Video interpretation system used at today's visit to help with language barrier   Follow-up: Return in about 3 months (around 01/12/2020) for HTN/pain.   Antony Blackbird, MD

## 2019-10-13 LAB — BASIC METABOLIC PANEL WITH GFR
BUN/Creatinine Ratio: 16 (ref 10–24)
BUN: 15 mg/dL (ref 8–27)
CO2: 25 mmol/L (ref 20–29)
Calcium: 10.2 mg/dL (ref 8.6–10.2)
Chloride: 104 mmol/L (ref 96–106)
Creatinine, Ser: 0.92 mg/dL (ref 0.76–1.27)
GFR calc Af Amer: 90 mL/min/1.73
GFR calc non Af Amer: 78 mL/min/1.73
Glucose: 82 mg/dL (ref 65–99)
Potassium: 4.2 mmol/L (ref 3.5–5.2)
Sodium: 145 mmol/L — ABNORMAL HIGH (ref 134–144)

## 2019-10-15 NOTE — Progress Notes (Signed)
Letter sent advising pt of normal results

## 2019-10-26 MED FILL — ?CHLORTHALIDONE 25 MG TABLE: 25 | 30 days supply | Qty: 30 | Fill #1

## 2019-10-30 ENCOUNTER — Encounter: Payer: Self-pay | Admitting: Family Medicine

## 2019-11-06 ENCOUNTER — Ambulatory Visit: Payer: Medicare Other | Admitting: Critical Care Medicine

## 2019-11-09 MED FILL — GABAPENTIN 100 MG CAPSULE: 100 | 30 days supply | Qty: 30 | Fill #1

## 2019-11-27 MED FILL — CHLORTHALIDONE 25 MG TAB: 25 | 90 days supply | Qty: 90 | Fill #0

## 2019-12-07 MED FILL — GABAPENTIN 100 MG CAPSULE: 100 | 30 days supply | Qty: 30 | Fill #2

## 2019-12-09 NOTE — Progress Notes (Signed)
Subjective:    Patient ID: Patrick Ortega, male    DOB: 1940/06/07, 79 y.o.   MRN: [id number]  This is a 79 year old male who comes to the clinic today to reestablish care last seen by his former primary care provider who is left the practice in October.  At that visit the patient was to maintain amlodipine 5 mg daily and chlorthalidone daily  The patient also complained of multiple joint pain and cervical radiculopathy osteoarthritis of left hip.  The patient had been seen by orthopedist in the past but had not followed up and follow through on treatments.  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  Note today's visit is accomplished with the use of the patient's daughter who speaks fluent [place].  Patient continues to complain of neck and back pain dizziness.  He states the gabapentin does help some of the pain in the feet but does not help the right hip pain.  He has difficulty starting his urine flow.  The patient did receive a Covid vaccine series in March of but is due a booster    Past Medical History:  Diagnosis Date  . GERD (gastroesophageal reflux disease)   . Hypertension      History reviewed. No pertinent family history.   Social History   Socioeconomic History  . Marital status: Married    Spouse name: Not on file  . Number of children: Not on file  . Years of education: Not on file  . Highest education level: Not on file  Occupational History  . Not on file  Tobacco Use  . Smoking status: Former Smoker    Packs/day: 0.25    Years: 0.50    Pack years: 0.12    Types: Cigarettes  . Smokeless tobacco: Never Used  Substance and Sexual Activity  . Alcohol use: Yes    Alcohol/week: 24.0 standard drinks    Types: 24 Cans of beer per week  . Drug use: Not on file  . Sexual activity: Yes  Other Topics Concern  . Not  on file  Social History Narrative  . Not on file   Social Determinants of Health   Financial Resource Strain:   . Difficulty of Paying Living Expenses: Not on file  Food Insecurity:   . Worried About Charity fundraiser in the Last Year: Not on file  . Ran Out of Food in the Last Year: Not on file  Transportation Needs:   . Lack of Transportation (Medical): Not on file  . Lack of Transportation (Non-Medical): Not on file  Physical Activity:   . Days of Exercise per Week: Not on file  . Minutes of Exercise per Session: Not on file  Stress:   . Feeling of Stress : Not on file  Social Connections:   . Frequency of Communication with Friends and Family: Not on file  . Frequency of Social Gatherings with Friends and Family: Not on file  . Attends Religious Services: Not on file  . Active Member of Clubs or Organizations: Not on file  . Attends Archivist Meetings: Not on file  . Marital Status: Not on file  Intimate Partner Violence:   . Fear of Current or Ex-Partner: Not on file  . Emotionally Abused: Not on file  . Physically Abused: Not on file  . Sexually Abused: Not  on file     No Known Allergies   Outpatient Medications Prior to Visit  Medication Sig Dispense Refill  . amLODipine (NORVASC) 5 MG tablet Take 1 tablet (5 mg total) by mouth daily. To lower blood pressure 30 tablet 4  . chlorthalidone (HYGROTON) 25 MG tablet Take 1 tablet (25 mg total) by mouth daily. 90 tablet 0  . gabapentin (NEURONTIN) 100 MG capsule Take 1 capsule (100 mg total) by mouth at bedtime. For numbness in hands 30 capsule 4  . meclizine (ANTIVERT) 12.5 MG tablet Take 1 tablet (12.5 mg total) by mouth 3 (three) times daily as needed for dizziness. (Patient not taking: Reported on 12/10/2019) 30 tablet 1  . meloxicam (MOBIC) 15 MG tablet TAKE 1 TABLET BY MOUTH ONCE DAILY AS NEEDED FOR PAIN (Patient not taking: Reported on 12/10/2019) 30 tablet 5  . methotrexate (RHEUMATREX) 7.5 MG tablet TAKE  ONE TABLET BY MOUTH ONCE A WEEK. (Patient not taking: Reported on 12/10/2019) 4 tablet 1  . Vitamin D, Ergocalciferol, (DRISDOL) 1.25 MG (50000 UNIT) CAPS capsule Take 1 capsule (50,000 Units total) by mouth every 7 (seven) days. (Patient not taking: Reported on 12/10/2019) 16 capsule 0   No facility-administered medications prior to visit.     Review of Systems  Constitutional: Negative.   HENT: Negative.   Eyes: Negative.   Respiratory: Negative.   Cardiovascular: Negative.   Gastrointestinal: Negative.   Endocrine: Negative.   Genitourinary: Positive for difficulty urinating.  Musculoskeletal: Positive for arthralgias, joint swelling and myalgias.  Neurological: Negative.   Psychiatric/Behavioral: Negative.        Objective:   Physical Exam  Vitals:   12/10/19 1502  BP: 138/81  Pulse: 95  Resp: 20  SpO2: 95%  Weight: 166 lb (75.3 kg)    Gen: Pleasant, well-nourished, in no distress,  normal affect  ENT: No lesions,  mouth clear,  oropharynx clear, no postnasal drip  Neck: No JVD, no TMG, no carotid bruits  Lungs: No use of accessory muscles, no dullness to percussion, clear without rales or rhonchi  Cardiovascular: RRR, heart sounds normal, no murmur or gallops, no peripheral edema  Abdomen: soft and NT, no HSM,  BS normal  Musculoskeletal: No deformities, no cyanosis or clubbing  Neuro: alert, non focal  Skin: Warm, no lesions or rashes     Assessment & Plan:  I personally reviewed all images and lab data in the [facility] system as well as any outside material available during this office visit and agree with the  radiology impressions.   Essential hypertension Hypertension under adequate control at this time we will continue amlodipine 5 mg daily and chlorthalidone 25 mg daily refills given  Osteoarthritis of left hip Significant osteoarthritis left hip would benefit from further evaluation  Rheumatoid arthritis with positive rheumatoid factor (HCC) Patient  labeled as having rheumatoid arthritis with positive rheumatoid factor but I cannot find evidence of this test having been done previously  Plan is to repeat rheumatoid factor and ANA  Discontinue meloxicam Increase gabapentin to 300 mg 3 times daily Obtain the Voltaren gel to apply topically to the joints  Obtain x-ray of the right shoulder  Referral to orthopedic surgery be made  Cervical radiculopathy Obtain cervical spine series  Chronic right shoulder pain Obtain x-rays of the right shoulder  Prostate cancer screening Will screen for prostate cancer obtain urinalysis as well  Urinary frequency Obtain urinalysis   Calub was seen today for neck pain.  Diagnoses and all orders  for this visit:  Need for immunization against influenza -     Flu Vaccine QUAD 36+ mos IM  Cervical radiculopathy -     gabapentin (NEURONTIN) 300 MG capsule; Take 1 capsule (300 mg total) by mouth 2 (two) times daily. For numbness in hands -     DG Cervical Spine Complete; Future -     Ambulatory referral to Orthopedic Surgery  Uncontrolled hypertension -     chlorthalidone (HYGROTON) 25 MG tablet; Take 1 tablet (25 mg total) by mouth daily. -     amLODipine (NORVASC) 5 MG tablet; Take 1 tablet (5 mg total) by mouth daily. To lower blood pressure  Urinary frequency -     PSA -     Urinalysis  Prostate cancer screening -     PSA  Chronic right shoulder pain -     DG Shoulder Right; Future -     Ambulatory referral to Orthopedic Surgery  Essential hypertension  Osteoarthritis of left hip, unspecified osteoarthritis type  Rheumatoid arthritis with positive rheumatoid factor, involving unspecified site ([place])  Other orders -     tamsulosin (FLOMAX) 0.4 MG CAPS capsule; Take 1 capsule (0.4 mg total) by mouth daily. -     diclofenac Sodium (VOLTAREN) 1 % GEL; Apply 4 g topically in the morning and at bedtime. To right side of neck and right shoulder -     Pneumococcal conjugate vaccine  13-valent -     Tdap vaccine greater than or equal to 7yo IM    Patient was given flu shot tetanus and Prevnar 13 pneumonia shot at this visit

## 2019-12-10 ENCOUNTER — Other Ambulatory Visit: Payer: Self-pay

## 2019-12-10 ENCOUNTER — Encounter: Payer: Self-pay | Admitting: Critical Care Medicine

## 2019-12-10 ENCOUNTER — Ambulatory Visit: Payer: Medicare Other | Attending: Family Medicine | Admitting: Critical Care Medicine

## 2019-12-10 ENCOUNTER — Other Ambulatory Visit: Payer: Self-pay | Admitting: Critical Care Medicine

## 2019-12-10 VITALS — BP 138/81 | HR 95 | Resp 20 | Wt 166.0 lb

## 2019-12-10 DIAGNOSIS — M5412 Radiculopathy, cervical region: Secondary | ICD-10-CM

## 2019-12-10 DIAGNOSIS — Z23 Encounter for immunization: Secondary | ICD-10-CM

## 2019-12-10 DIAGNOSIS — I10 Essential (primary) hypertension: Secondary | ICD-10-CM | POA: Diagnosis not present

## 2019-12-10 DIAGNOSIS — Z87891 Personal history of nicotine dependence: Secondary | ICD-10-CM | POA: Insufficient documentation

## 2019-12-10 DIAGNOSIS — R350 Frequency of micturition: Secondary | ICD-10-CM

## 2019-12-10 DIAGNOSIS — M1612 Unilateral primary osteoarthritis, left hip: Secondary | ICD-10-CM

## 2019-12-10 DIAGNOSIS — Z79899 Other long term (current) drug therapy: Secondary | ICD-10-CM | POA: Insufficient documentation

## 2019-12-10 DIAGNOSIS — G8929 Other chronic pain: Secondary | ICD-10-CM

## 2019-12-10 DIAGNOSIS — M059 Rheumatoid arthritis with rheumatoid factor, unspecified: Secondary | ICD-10-CM | POA: Diagnosis not present

## 2019-12-10 DIAGNOSIS — Z125 Encounter for screening for malignant neoplasm of prostate: Secondary | ICD-10-CM | POA: Diagnosis not present

## 2019-12-10 DIAGNOSIS — M25511 Pain in right shoulder: Secondary | ICD-10-CM | POA: Diagnosis not present

## 2019-12-10 MED ORDER — AMLODIPINE BESYLATE 5 MG PO TABS: 5.0000 mg | ORAL_TABLET | Freq: Every day | ORAL | 4 refills | Status: DC

## 2019-12-10 MED ORDER — GABAPENTIN 300 MG PO CAPS: 300.0000 mg | ORAL_CAPSULE | Freq: Two times a day (BID) | ORAL | 3 refills | Status: DC

## 2019-12-10 MED ORDER — TAMSULOSIN HCL 0.4 MG PO CAPS: 0.4000 mg | ORAL_CAPSULE | Freq: Every day | ORAL | 3 refills | Status: DC

## 2019-12-10 MED ORDER — CHLORTHALIDONE 25 MG PO TABS: 25.0000 mg | ORAL_TABLET | Freq: Every day | ORAL | 0 refills | Status: DC

## 2019-12-10 MED ORDER — DICLOFENAC SODIUM 1 % EX GEL: 4.0000 g | Freq: Two times a day (BID) | CUTANEOUS | 2 refills | Status: DC

## 2019-12-10 MED FILL — DICLOFENAC SODIUM 1% GEL: 1 | 7 days supply | Qty: 100 | Fill #0

## 2019-12-10 MED FILL — TAMSULOSIN HCL 0.4 MG CAP: 0.4 | 30 days supply | Qty: 30 | Fill #0

## 2019-12-10 MED FILL — AMLODIPINE BESYLATE 5 MG TA: 5 | 30 days supply | Qty: 30 | Fill #0

## 2019-12-10 NOTE — Assessment & Plan Note (Signed)
Significant osteoarthritis left hip would benefit from further evaluation

## 2019-12-10 NOTE — Assessment & Plan Note (Signed)
Obtain urinalysis

## 2019-12-10 NOTE — Assessment & Plan Note (Signed)
Obtain cervical spine series

## 2019-12-10 NOTE — Assessment & Plan Note (Signed)
Hypertension under adequate control at this time we will continue amlodipine 5 mg daily and chlorthalidone 25 mg daily refills given

## 2019-12-10 NOTE — Assessment & Plan Note (Signed)
Patient labeled as having rheumatoid arthritis with positive rheumatoid factor but I cannot find evidence of this test having been done previously  Plan is to repeat rheumatoid factor and ANA  Discontinue meloxicam Increase gabapentin to 300 mg 3 times daily Obtain the Voltaren gel to apply topically to the joints  Obtain x-ray of the right shoulder  Referral to orthopedic surgery be made

## 2019-12-10 NOTE — Assessment & Plan Note (Signed)
Will screen for prostate cancer obtain urinalysis as well

## 2019-12-10 NOTE — Patient Instructions (Addendum)
Flu vaccine, pneumonia shot, tetanus shot given  Please consider Covid booster vaccine below is where you can find one COVID-19 Vaccine Information can be found at: ShippingScam.co.uk For questions related to vaccine distribution or appointments, please email [email or web address] or call [phone].    Refills on your blood pressure medicine was made, discontinue meloxicam, begin Voltaren gel to the right neck and right shoulder twice daily, begin Flomax daily for your urine flow concerns  X-rays of the neck and shoulder will be made  Blood work today will be a prostate cancer screening test  Urinalysis will be obtained which is a urine sample  Increase gabapentin to 300 mg twice daily  Referral back to orthopedics will be made   Return to see Dr. Joya Gaskins 6 weeks

## 2019-12-10 NOTE — Assessment & Plan Note (Signed)
Obtain x-rays of the right shoulder

## 2019-12-10 NOTE — Progress Notes (Signed)
Pain in neck and back Dizziness  Ongoing sx's x 3 mos.  Unsure if he is taking Rx'ed medication.

## 2019-12-11 ENCOUNTER — Telehealth: Payer: Self-pay | Admitting: Critical Care Medicine

## 2019-12-11 DIAGNOSIS — R972 Elevated prostate specific antigen [PSA]: Secondary | ICD-10-CM | POA: Insufficient documentation

## 2019-12-11 DIAGNOSIS — R350 Frequency of micturition: Secondary | ICD-10-CM

## 2019-12-11 LAB — URINALYSIS
Bilirubin, UA: NEGATIVE
Glucose, UA: NEGATIVE
Ketones, UA: NEGATIVE
Leukocytes,UA: NEGATIVE
Nitrite, UA: NEGATIVE
RBC, UA: NEGATIVE
Specific Gravity, UA: 1.022 (ref 1.005–1.030)
Urobilinogen, Ur: 1 mg/dL (ref 0.2–1.0)
pH, UA: 7 (ref 5.0–7.5)

## 2019-12-11 LAB — PSA: Prostate Specific Ag, Serum: 9.1 ng/mL — ABNORMAL HIGH (ref 0.0–4.0)

## 2019-12-11 NOTE — Telephone Encounter (Signed)
PSA elevated.  I called pt granddaughter Vaughan Basta and gave her results  Will need Urology referral.   His UA was NEG.  Alk phos was normal in April

## 2019-12-14 ENCOUNTER — Ambulatory Visit (HOSPITAL_COMMUNITY)
Admission: RE | Admit: 2019-12-14 | Discharge: 2019-12-14 | Disposition: A | Discharge status: Home / Self Care | Payer: Medicare Other | Source: Ambulatory Visit | Attending: Critical Care Medicine | Admitting: Critical Care Medicine

## 2019-12-14 ENCOUNTER — Other Ambulatory Visit: Payer: Self-pay

## 2019-12-14 DIAGNOSIS — M25511 Pain in right shoulder: Secondary | ICD-10-CM | POA: Insufficient documentation

## 2019-12-14 DIAGNOSIS — G8929 Other chronic pain: Secondary | ICD-10-CM | POA: Diagnosis not present

## 2019-12-14 DIAGNOSIS — M542 Cervicalgia: Secondary | ICD-10-CM | POA: Diagnosis not present

## 2019-12-14 DIAGNOSIS — M5412 Radiculopathy, cervical region: Secondary | ICD-10-CM | POA: Diagnosis not present

## 2019-12-18 ENCOUNTER — Telehealth: Payer: Self-pay

## 2019-12-18 NOTE — Telephone Encounter (Signed)
[place] interpreters  Johnsie Cancel Id# [id number]  contacted pt to go over xray results pt didn't answer and was unable to lvm due to vm not being set up

## 2019-12-19 ENCOUNTER — Encounter: Payer: Self-pay | Admitting: Family Medicine

## 2019-12-19 ENCOUNTER — Other Ambulatory Visit: Payer: Self-pay

## 2019-12-19 ENCOUNTER — Ambulatory Visit (INDEPENDENT_AMBULATORY_CARE_PROVIDER_SITE_OTHER): Payer: Medicare Other | Admitting: Family Medicine

## 2019-12-19 ENCOUNTER — Other Ambulatory Visit: Payer: Self-pay | Admitting: Family Medicine

## 2019-12-19 DIAGNOSIS — E559 Vitamin D deficiency, unspecified: Secondary | ICD-10-CM | POA: Diagnosis not present

## 2019-12-19 DIAGNOSIS — M542 Cervicalgia: Secondary | ICD-10-CM

## 2019-12-19 DIAGNOSIS — M059 Rheumatoid arthritis with rheumatoid factor, unspecified: Secondary | ICD-10-CM

## 2019-12-19 NOTE — Progress Notes (Signed)
Office Visit Note   Patient: Patrick Ortega           Date of Birth: 1940-04-23           MRN: [id number] Visit Date: 12/19/2019 Requested by: Patrick Stain, MD [address],  [place] [id number] PCP: Patrick Stain, MD  Subjective: Chief Complaint  Patient presents with  . Neck - Pain    Pain x 4 months, bilaterally. Pain into the shoulders when he turns his head from side-to-side. Occasionally hurts into the head, with dizziness. NKI    HPI: He is here with neck and bilateral shoulder pain.  Symptoms present for about 4 months now.  I saw him in May and gave him a subacromial injection and a physical therapy referral.  He had slight improvement in symptoms but not a lot.  In addition to his neck and bilateral shoulder pain, his low back and hips are bothering him as well.  He is on gabapentin now but that does not seem to make much difference.  He has a history of rheumatoid arthritis and apparently did not tolerate methotrexate.  He has not been back to a rheumatologist.                ROS:   All other systems were reviewed and are negative.  Objective: Vital Signs: There were no vitals taken for this visit.  Physical Exam:  General:  Alert and oriented, in no acute distress. Pulm:  Breathing unlabored. Psy:  Normal mood, congruent affect. Skin: No erythema or rash Neck: He has limited rotation bilaterally.  He has a head forward posture.  He has tenderness in the lower cervical paraspinous muscles bilaterally.  Upper extremity strength and reflexes remain normal.  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    Assessment & Plan: 1.  Chronic neck pain with bilateral shoulder pain, possibly due to facet arthropathy.  Rheumatoid arthritis could be contributing to his multiple areas of pain. -Discussed options with him and his interpreter, elected to refer him to Dr. Ernestina Ortega for possible facet  injections.  We will also refer him to rheumatology for other medication options.     Procedures: No procedures performed        PMFS History: Patient Active Problem List   Diagnosis Date Noted  . Elevated PSA 12/11/2019  . Urinary frequency 12/10/2019  . Cervical radiculopathy 12/10/2019  . Prostate cancer screening 12/10/2019  . Chronic right shoulder pain 12/10/2019  . Rheumatoid arthritis with positive rheumatoid factor (Patrick Ortega) 08/12/2016  . Essential hypertension 05/24/2014  . Osteoarthritis of left hip 05/24/2014   Past Medical History:  Diagnosis Date  . GERD (gastroesophageal reflux disease)   . Hypertension     History reviewed. No pertinent family history.  Past Surgical History:  Procedure Laterality Date  . NO PAST SURGERIES     Social History   Occupational History  . Not on file  Tobacco Use  . Smoking status: Former Smoker    Packs/day: 0.25    Years: 0.50    Pack years: 0.12    Types: Cigarettes  . Smokeless tobacco: Never Used  Substance and Sexual Activity  . Alcohol use: Yes    Alcohol/week: 24.0 standard drinks    Types: 24 Cans of beer per week  . Drug use: Not on file  . Sexual activity: Yes

## 2020-01-09 MED FILL — GABAPENTIN 300 MG CAPSULE: 300 | 30 days supply | Qty: 60 | Fill #0

## 2020-01-14 ENCOUNTER — Ambulatory Visit: Payer: Medicare Other | Admitting: Family Medicine

## 2020-01-15 DIAGNOSIS — Z23 Encounter for immunization: Secondary | ICD-10-CM | POA: Diagnosis not present

## 2020-01-31 NOTE — Progress Notes (Signed)
Office Visit Note  Patient: Patrick Ortega             Date of Birth: 1940/04/14           MRN: [id number]             PCP: [name], MD Referring: Eunice Blase, MD Visit Date: 02/01/2020   Subjective:  New Patient (Initial Visit) (7)   History of Present Illness: Patrick Ortega is a 80 y.o. 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 He is currently having pain worst in the back of the neck, the top and back of the shoulders, and in his left hip. The pain is worse with weight bearing and active ROM. His neck pain sometimes radiates and he experiences bilateral headaches. These symptoms have been persistent for a long time with some slow progression recently especially in the back of the neck pain. He does not notice peripheral joint swelling or pain.  He was previously diagnosed with rheumatoid arthritis with positive RF and treated with methotrexate for some time starting 2018 but discontinued this he does not recall exactly why.  LAbs reviewed 05/2019 CBC- WBC 12.1 BMP- Calcium 10.3  04/2019 Vitamin D 18.6  08/2016 RF 14.2 ANA neg Uric acid wnl  Imaging reviewed 12/2019 Xray right shoulder Moderate degenerative joint disease of the right acromioclavicular joint. Xray cervical spine Multilevel degenerative disc disease. Mild bilateral neural foraminal stenosis is noted secondary to uncovertebral spurring.  02/2015 Xray right ankle No significant degenerative change seen  Activities of Daily Living:  Patient reports morning stiffness for 7 minutes.   Patient Denies nocturnal pain.  Difficulty dressing/grooming: Denies Difficulty climbing stairs: Reports Difficulty getting out of chair: Denies Difficulty using hands for taps, buttons, cutlery, and/or writing: Denies  Review of Systems  Constitutional: Positive for fatigue.   HENT: Positive for mouth dryness.   Eyes: Negative for dryness.  Respiratory: Negative for shortness of breath.   Cardiovascular: Negative for swelling in legs/feet.  Gastrointestinal: Negative for constipation.  Endocrine: Negative for increased urination.  Genitourinary: Negative for difficulty urinating.  Musculoskeletal: Positive for arthralgias, joint pain, muscle weakness, morning stiffness and muscle tenderness.  Skin: Negative for rash.  Allergic/Immunologic: Negative for susceptible to infections.  Neurological: Positive for numbness and weakness.  Hematological: Negative for bruising/bleeding tendency.  Psychiatric/Behavioral: Negative for sleep disturbance.    PMFS History:  Patient Active Problem List   Diagnosis Date Noted  . Elevated PSA 12/11/2019  . Urinary frequency 12/10/2019  . Cervical radiculopathy 12/10/2019  . Prostate cancer screening 12/10/2019  . Chronic right shoulder pain 12/10/2019  . Rheumatoid arthritis with positive rheumatoid factor ([place]) 08/12/2016  . Essential hypertension 05/24/2014  . Osteoarthritis of left hip 05/24/2014    Past Medical History:  Diagnosis Date  . GERD (gastroesophageal reflux disease)   . Hypertension     Family History  Problem Relation Age of Onset  . Ulcers Mother    Past Surgical History:  Procedure Laterality Date  . NO PAST SURGERIES     Social History   Social History Narrative  . Not on file   Immunization History  Administered Date(s) Administered  . Influenza,inj,Quad PF,6+ Mos 12/10/2019  . PFIZER(Purple Top)SARS-COV-2 Vaccination 03/06/2019, 04/04/2019  . Pneumococcal Conjugate-13 12/10/2019  . Tdap 12/10/2019     Objective: Vital Signs: BP (!) 170/77 (BP Location: Right Arm, Patient Position: Sitting,  Cuff Size: Normal)   Pulse 69   Resp 16   Ht 5' 1.5" (1.562 m)   Wt 163 lb (73.9 kg)   BMI 30.30 kg/m    Physical Exam HENT:     Right Ear: External ear normal.     Left Ear: External  ear normal.     Mouth/Throat:     Mouth: Mucous membranes are moist.     Pharynx: Oropharynx is clear.  Eyes:     Comments: Bilateral pterygium present  Cardiovascular:     Rate and Rhythm: Normal rate and regular rhythm.  Pulmonary:     Effort: Pulmonary effort is normal.     Breath sounds: Normal breath sounds.  Skin:    General: Skin is warm and dry.  Neurological:     General: No focal deficit present.     Mental Status: He is alert.  Psychiatric:        Mood and Affect: Mood normal.     Musculoskeletal Exam:  Neck full range of motion stiffness and tenderness to palpation over paraspinal muscles Shoulders slightly reduced abduction and internal rotation ROM, rolled forward posture, some tenderness to palpation above scapulae Right hip rotation normal, left hip internal rotation very reduced and painful, bilateral knee crepitus with good ROM MTPs no swelling or tenderness   Investigation: No additional findings.  Imaging: No results found.  Recent Labs: Lab Results  Component Value Date   WBC 12.1 (H) 05/09/2019   HGB 15.1 05/09/2019   PLT 437 05/09/2019   NA 145 (H) 10/12/2019   K 4.2 10/12/2019   CL 104 10/12/2019   CO2 25 10/12/2019   GLUCOSE 82 10/12/2019   BUN 15 10/12/2019   CREATININE 0.92 10/12/2019   BILITOT 0.5 04/25/2019   ALKPHOS 103 04/25/2019   AST 25 04/25/2019   ALT 15 04/25/2019   PROT 7.7 04/25/2019   ALBUMIN 4.8 (H) 04/25/2019   CALCIUM 10.2 10/12/2019   GFRAA 90 10/12/2019    Speciality Comments: No specialty comments available.  Procedures:  No procedures performed Allergies: Patient has no known allergies.   Assessment / Plan:     Visit Diagnoses: Rheumatoid arthritis involving multiple sites with positive rheumatoid factor ([place]) - Plan: Cyclic citrul peptide antibody, IgG, Rheumatoid factor, Sedimentation rate  History of seropositive RA he does not have obvious signs of inflammatory disease activity on history and exam  today. Could also have chronic joint damage in cervical spine and shoulders from previous disease activity vs primary OA. Will check ESR also repeat RA serology at this time. If does have more evidence of inflammation will discuss whether trying DMARDs likely to help symptoms or not.  Chronic right shoulder pain  Discussed shoulder pain also muscle tenderness recommended stretching and strengthening exercises provided.  Primary osteoarthritis of left hip  Apparently not felt to be good surgical candidate due to age for hip replacement according to patient and granddaughter. He has existing follow up with ortho and PMR recommended they can assist if injection or other advanced treatment needed, not a typical site for RA involvement.  Orders: Orders Placed This Encounter  Procedures  . Cyclic citrul peptide antibody, IgG  . Rheumatoid factor  . Sedimentation rate   No orders of the defined types were placed in this encounter.    Follow-Up Instructions: No follow-ups on file.   Collier Salina, MD  Note - This record has been created using [employer].  Chart creation errors have been sought, but  may not always  have been located. Such creation errors do not reflect on  the standard of medical care.  

## 2020-02-01 ENCOUNTER — Other Ambulatory Visit: Payer: Self-pay

## 2020-02-01 ENCOUNTER — Ambulatory Visit (INDEPENDENT_AMBULATORY_CARE_PROVIDER_SITE_OTHER): Payer: Medicare Other | Admitting: Internal Medicine

## 2020-02-01 ENCOUNTER — Encounter: Payer: Self-pay | Admitting: Internal Medicine

## 2020-02-01 VITALS — BP 170/77 | HR 69 | Resp 16 | Ht 61.5 in | Wt 163.0 lb

## 2020-02-01 DIAGNOSIS — M0579 Rheumatoid arthritis with rheumatoid factor of multiple sites without organ or systems involvement: Secondary | ICD-10-CM

## 2020-02-01 DIAGNOSIS — M25511 Pain in right shoulder: Secondary | ICD-10-CM

## 2020-02-01 DIAGNOSIS — M1612 Unilateral primary osteoarthritis, left hip: Secondary | ICD-10-CM

## 2020-02-01 DIAGNOSIS — G8929 Other chronic pain: Secondary | ICD-10-CM

## 2020-02-01 NOTE — Patient Instructions (Signed)
Prueba de velocidad de eritrosedimentacin Erythrocyte Sedimentation Rate Test Por qu me debo realizar esta prueba? La prueba de velocidad de eritrosedimentacin (VES) se realiza para ayudar a Engineer, building services relacionadas con:  Infecciones repentinas (agudas) o de largo plazo (crnicas).  Inflamacin.  Ataques a clulas sanas por parte del sistema del cuerpo encargado de combatir las enfermedades (enfermedades autoinmunitarias).  Cncer.  Muerte de tejido. Si tiene sntomas que podran relacionarse con cualquiera de estas enfermedades, es posible que el mdico le haga una prueba de VES antes de Optometrist estudios ms especficos. Esta prueba tambin se puede usar para ayudar a Doctor, general practice si tiene una enfermedad autoinmunitaria inflamatoria, como artritis reumatoide. Qu se analiza? En esta prueba, se mide cunto tardan los glbulos rojos (eritrocitos) en asentarse en una solucin durante un cierto plazo de tiempo (velocidad de sedimentacin). Cuando una persona tiene una infeccin o inflamacin, los glbulos rojos se agrupan y se asientan ms rpidamente. La velocidad de sedimentacin aporta informacin sobre el grado de inflamacin presente en el organismo. Qu tipo de [place] se toma? Para esta prueba, se extrae Truddie Coco de [place]. Por lo general, para extraerla, se introduce una aguja en un vaso sanguneo.   Cmo debo prepararme para este anlisis? Siga las instrucciones del mdico acerca de si debe cambiar o suspender los medicamentos que habitualmente toma. Informe al mdico acerca de lo siguiente:  Cualquier alergia que tenga.  Todos los [employer] [place], incluidos vitaminas, hierbas, gotas oftlmicas, cremas y medicamentos de venta libre.  Cualquier trastorno de la sangre que tenga.  Cirugas a las que se haya sometido.  Enfermedades que tenga, por ejemplo, problemas tiroideos o enfermedad renal.  Si est embarazada o podra estarlo. Cmo  se informan los resultados? Los [employer] se informan como un valor que mide la velocidad de sedimentacin en milmetros por hora (mm/h). Su mdico comparar sus resultados con los rangos normales que se establecieron luego de Optometrist la prueba a un grupo grande de personas (valores de Biomedical engineer). Los valores de referencia pueden variar entre diferentes laboratorios y hospitales. Los valores de referencia habituales para esta prueba, que pueden variar en funcin de la edad y [place], son los siguientes:  Recin nacidos: de 0 a 91mm/h.  Nios hasta la pubertad: de 0 a 10 mm/h.  Mujeres: ? Menores de 50 aos: de 0 a 20 mm/h. ? De entre 50 y 50 aos: de 0 a 30 mm/h. ? Mayores de 74 aos: de 0 a 42 mm/h.  Hombres: ? Menores de 50 aos: de 0 a 15 mm/h. ? De entre 50 y 43 aos: de 0 a 20 mm/h. ? Mayores de 84 aos: de 0 a 30 mm/h. Ciertas afecciones o medicamentos puede causar niveles de VES falsamente ms bajos o ms altos, por ejemplo:  Embarazo.  Obesidad.  Corticoesteroides, pldoras anticonceptivas y anticoagulantes.  Problemas tiroideos o enfermedad renal. Qu significan los [place]? Los [employer] se encuentran dentro de los valores de Biomedical engineer se consideran normales y significan que el nivel de inflamacin presente en el cuerpo es saludable. Los valores de VES altos indican que existe una inflamacin en el cuerpo. Es posible que le hagan ms estudios para ayudar a Producer, television/film/video. La inflamacin puede deberse a muchas afecciones o lesiones diferentes. Hable con su mdico sobre lo que significan sus [place]. Preguntas para hacerle al mdico Consulte a su mdico o pregunte en el departamento donde se realiza la prueba acerca de lo siguiente:  Cundo estarn disponibles mis  resultados?  Cmo obtendr mis resultados?  Cules son las opciones de tratamiento?  Qu otras pruebas necesito?  Cules son los prximos pasos que debo seguir? Resumen  La  prueba de velocidad de eritrosedimentacin (VES) se realiza para ayudar a Hydrographic surveyor enfermedades relacionadas con infecciones repentinas (agudas) o de largo plazo (crnicas), inflamacin, enfermedades autoinmunitarias, cncer y [place] de tejido.  Si tiene sntomas que podran relacionarse con cualquiera de estas enfermedades, es posible que el mdico le haga una prueba de VES antes de Optometrist estudios ms especficos. Esta prueba tambin se puede usar para ayudar a Doctor, general practice si tiene una enfermedad autoinmunitaria inflamatoria, como artritis reumatoide.  En esta prueba, se mide cunto tardan los glbulos rojos (eritrocitos) en asentarse en una solucin durante un cierto plazo de tiempo (velocidad de sedimentacin). Esto aporta informacin sobre el grado de inflamacin presente en el organismo.    Prueba del factor reumatoide Rheumatoid Factor Test Por qu me debo realizar esta prueba? La prueba del factor reumatoide se [place] para ayudar a diagnosticar determinadas enfermedades autoinmunitarias. Normalmente, el cuerpo genera protenas protectoras llamadas anticuerpos (IgM, IgG, e IgA) para ayudar a combatir las infecciones. Si tiene una enfermedad autoinmunitaria, el cuerpo puede producir una serie de anticuerpos que no funcionan correctamente (autoanticuerpos). Ellos atacan los tejidos que el cuerpo identifica como extraos por error. En algunas enfermedades autoinmunitarias, estos autoanticuerpos se conocen como el factor reumatoide (FR). Puede someterse a esta prueba si su mdico sospecha que usted tiene una enfermedad autoinmunitaria como las siguientes:  Artritis reumatoide (AR).  Lupus eritematoso sistmico (LES).  [place]rome de [place].  Enfermedad mixta del tejido conjuntivo. [place] con artritis reumatoide tienen resultados positivos en la prueba del factor reumatoide. A veces, los elevados niveles de estos autoanticuerpos tambin puede ser un signo de  otras enfermedades autoinmunitarias. Sin embargo, es posible que el resultado de la prueba del FR sea negativo aunque la enfermedad est presente. De forma similar, una cantidad reducida de personas pueden tener una prueba positiva del FR cuando en realidad no padecen una enfermedad autoinmunitaria. Pueden hacerle otras pruebas para ayudar a Optometrist un diagnstico. Qu se analiza? Esta prueba analiza la sangre para la deteccin de los autoanticuerpos [place]. Qu tipo de [place] se toma? Para esta prueba, se extrae Truddie Coco de [place]. Por lo general, para extraerla, se introduce una aguja en un vaso sanguneo o se pincha un dedo con una aguja pequea.   Cmo se informan los resultados? El resultado de la prueba se informar como positivo o negativo en cuanto a los autoanticuerpos FR. [place] significan los resultados? Un resultado negativo significa que no hay FR o solo se Engineer, structural pequea cantidad [employer]. Esto significa que es improbable que usted tenga una enfermedad autoinmunitaria. Un resultado positivo significa que una gran cantidad de autoanticuerpos FR se encuentra en la sangre. Esto puede indicar que usted tiene AR u otra enfermedad autoinmunitaria. Su mdico le indicar si es Teaching laboratory technician repetir las pruebas para [employer]. Hable con su mdico sobre lo que significan sus [place]. Preguntas para hacerle al mdico Consulte a su mdico o pregunte en el departamento donde se realiza la prueba acerca de lo siguiente:  Cundo estarn disponibles mis resultados?  Cmo obtendr mis resultados?  Cules son las opciones de tratamiento?  Qu otras pruebas necesito?  Cules son los prximos pasos que debo seguir? Resumen  La prueba del factor reumatoide se [place] para ayudar a diagnosticar determinadas enfermedades autoinmunitarias.  En algunas  enfermedades autoinmunitarias, el organismo produce autoanticuerpos conocidos como el factor reumatoideo (FR) que atacan  los tejidos que el cuerpo identifica como extraos por error.  Un resultado negativo significa que no hay FR o solo se Engineer, structural pequea cantidad [employer].  Un resultado positivo significa que una gran cantidad de autoanticuerpos FR se encuentra en la sangre.  Hable con su mdico sobre lo que significan sus [place].

## 2020-02-03 NOTE — Progress Notes (Signed)
Subjective:    Patient ID: Patrick Ortega, male    DOB: 09/05/1940, 80 y.o.   MRN: [id number]  This is a 80 year old male who comes to the clinic today to reestablish care last seen by his former primary care provider who is left the practice in October.  At that visit the patient was to maintain amlodipine 5 mg daily and chlorthalidone daily  The patient also complained of multiple joint pain and cervical radiculopathy osteoarthritis of left hip.  The patient had been seen by orthopedist in the past but had not followed up and follow through on treatments.  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  Note today's visit is accomplished with the use of the patient's daughter who speaks fluent [place].  Patient continues to complain of neck and back pain dizziness.  He states the gabapentin does help some of the pain in the feet but does not help the right hip pain.  He has difficulty starting his urine flow.  The patient did receive a Covid vaccine series in March of but is due a booster  02/04/2020 Pt has seen Ortho and Rheum  Plan cervical facet injections.     HPI  Patrick Ortega is a 80 y.o. M with PMH significant for osteoarthritis and joint pain in the neck, the top, and back of the shoulders, left hip, and right ankle. Rates pain 6/10. The pain is worse with weight-bearing and active ROM. His neck pain radiates, and he experiences bilateral headaches. The patient endorses complaints about pain in the head and neck and reports stopping Diclofenac gel due to medication side effects. States that mediation makes him feel dizzy and weak. Inquired about joint injection in the left hip and right ankle.  Urine flow improved on tamsulosin.  Pt endorses he stopped several medications thinking course of treatment was limited ISpainish  nterpretor used :  Carlyle Lipa [id number]   Past Medical History:  Diagnosis Date  . GERD (gastroesophageal reflux disease)   . Hypertension      Family History  Problem Relation Age of Onset  . Ulcers Mother      Social History   Socioeconomic History  . Marital status: Married    Spouse name: Not on file  . Number of children: Not on file  . Years of education: Not on file  . Highest education level: Not on file  Occupational History  . Not on file  Tobacco Use  . Smoking status: Former Smoker    Packs/day: 0.25    Years: 0.50    Pack years: 0.12    Types: Cigarettes  . Smokeless tobacco: Never Used  Vaping Use  . Vaping Use: Never used  Substance and Sexual Activity  . Alcohol use: Not Currently  . Drug use: Not on file  . Sexual activity: Yes  Other Topics Concern  . Not on file  Social History Narrative  . Not on file   Social Determinants of Health   Financial Resource Strain: Not on file  Food Insecurity: Not on file  Transportation Needs: Not on file  Physical Activity: Not on file  Stress: Not on file  Social Connections: Not on file  Intimate Partner Violence: Not on file     No Known Allergies   Outpatient Medications Prior to Visit  Medication Sig Dispense Refill  . amLODipine (NORVASC)  5 MG tablet Take 1 tablet (5 mg total) by mouth daily. To lower blood pressure 30 tablet 4  . chlorthalidone (HYGROTON) 25 MG tablet Take 1 tablet (25 mg total) by mouth daily. (Patient not taking: Reported on 02/04/2020) 90 tablet 0  . gabapentin (NEURONTIN) 300 MG capsule Take 1 capsule (300 mg total) by mouth 2 (two) times daily. For numbness in hands (Patient not taking: Reported on 02/04/2020) 60 capsule 3  . tamsulosin (FLOMAX) 0.4 MG CAPS capsule Take 1 capsule (0.4 mg total) by mouth daily. (Patient not taking: Reported on 02/04/2020) 30 capsule 3  . diclofenac Sodium (VOLTAREN) 1 % GEL Apply 4 g topically in the morning and at bedtime. To right side of neck and right shoulder (Patient  not taking: Reported on 02/04/2020) 100 g 2   No facility-administered medications prior to visit.     Review of Systems  Constitutional: Negative.  Negative for activity change.  HENT: Negative.   Eyes: Negative.  Negative for pain and discharge.  Respiratory: Negative.   Cardiovascular: Negative.   Gastrointestinal: Negative.   Endocrine: Negative.   Genitourinary: Negative for difficulty urinating.       Tamsulosin improved urination  Musculoskeletal: Positive for arthralgias, joint swelling and myalgias.  Neurological: Negative.   Psychiatric/Behavioral: Negative.        Objective:   Physical Exam Constitutional:      Appearance: Normal appearance.  HENT:     Head: Normocephalic.     Nose: Nose normal.     Mouth/Throat:     Mouth: Mucous membranes are moist.  Eyes:     Pupils: Pupils are equal, round, and reactive to light.  Cardiovascular:     Rate and Rhythm: Normal rate and regular rhythm.     Heart sounds: Normal heart sounds.  Pulmonary:     Effort: Pulmonary effort is normal.     Breath sounds: Normal breath sounds.  Abdominal:     General: There is no distension.     Tenderness: There is no abdominal tenderness.  Musculoskeletal:     Cervical back: No rigidity or tenderness.     Right lower leg: No edema.     Left lower leg: No edema.  Skin:    General: Skin is warm and dry.  Neurological:     Mental Status: He is alert and oriented to person, place, and time.  Psychiatric:        Mood and Affect: Mood normal.        Behavior: Behavior normal.     Vitals:   02/04/20 1410  BP: (!) 158/79  Pulse: 74  Resp: 20  SpO2: 97%  Weight: 165 lb (74.8 kg)      Assessment & Plan:  I personally reviewed all images and lab data in the [facility] system as well as any outside material available during this office visit and agree with the  radiology impressions.   Essential hypertension Continue taking amlodipine and resume chlorthalidone.   Osteoarthritis of  left hip Recommended patient to see Dr.newton for joint injections  Rheumatoid arthritis with positive rheumatoid factor ([place]) Recommended to hold- off taking gabapentin Recommended to stop taking diclofinac due to medication side effects. Pathophysiology of disease explained.  Urinary frequency Resume Flomax  Darryn was seen today for pain.  Diagnoses and all orders for this visit:  Essential hypertension  Primary osteoarthritis of left hip  Rheumatoid arthritis involving multiple sites with positive rheumatoid factor ([place])  Urinary frequency    Essential  hypertension Continue taking amlodipine and resume chlorthalidone.   Osteoarthritis of left hip Recommended patient to see Dr.newton for joint injections  Rheumatoid arthritis with positive rheumatoid factor ([place]) Recommended to hold- off taking gabapentin Recommended to stop taking diclofinac due to medication side effects. Pathophysiology of disease explained.  Urinary frequency Resume Flomax   Juron was seen today for pain.  Diagnoses and all orders for this visit:  Essential hypertension  Primary osteoarthritis of left hip  Rheumatoid arthritis involving multiple sites with positive rheumatoid factor (H[facility])  Urinary frequency

## 2020-02-04 ENCOUNTER — Ambulatory Visit: Payer: Medicare Other | Attending: Critical Care Medicine | Admitting: Critical Care Medicine

## 2020-02-04 ENCOUNTER — Other Ambulatory Visit: Payer: Self-pay

## 2020-02-04 ENCOUNTER — Encounter: Payer: Self-pay | Admitting: Critical Care Medicine

## 2020-02-04 VITALS — BP 158/79 | HR 74 | Resp 20 | Wt 165.0 lb

## 2020-02-04 DIAGNOSIS — R350 Frequency of micturition: Secondary | ICD-10-CM | POA: Diagnosis not present

## 2020-02-04 DIAGNOSIS — M1612 Unilateral primary osteoarthritis, left hip: Secondary | ICD-10-CM | POA: Diagnosis not present

## 2020-02-04 DIAGNOSIS — M0579 Rheumatoid arthritis with rheumatoid factor of multiple sites without organ or systems involvement: Secondary | ICD-10-CM | POA: Diagnosis not present

## 2020-02-04 DIAGNOSIS — I10 Essential (primary) hypertension: Secondary | ICD-10-CM | POA: Insufficient documentation

## 2020-02-04 DIAGNOSIS — Z791 Long term (current) use of non-steroidal anti-inflammatories (NSAID): Secondary | ICD-10-CM | POA: Diagnosis not present

## 2020-02-04 DIAGNOSIS — Z87891 Personal history of nicotine dependence: Secondary | ICD-10-CM | POA: Diagnosis not present

## 2020-02-04 DIAGNOSIS — M5412 Radiculopathy, cervical region: Secondary | ICD-10-CM | POA: Insufficient documentation

## 2020-02-04 DIAGNOSIS — Z79899 Other long term (current) drug therapy: Secondary | ICD-10-CM | POA: Diagnosis not present

## 2020-02-04 LAB — CYCLIC CITRUL PEPTIDE ANTIBODY, IGG: Cyclic Citrullin Peptide Ab: <16 UNITS

## 2020-02-04 LAB — SEDIMENTATION RATE: Sed Rate: 55 mm/h — ABNORMAL HIGH (ref 0–20)

## 2020-02-04 LAB — RHEUMATOID FACTOR: Rheumatoid fact SerPl-aCnc: <14 IU/mL (ref <14)

## 2020-02-04 NOTE — Patient Instructions (Addendum)
  Reanudar tamsolosina, clortalidona y amlodipino Mantenga la gabapentina Deje de tomar diclofenaco sdico en gel  Mantener cita de seguimiento con ortopedia Regrese a la oficina para ver al Dr. Joya Ortega en 3 semanas.   Resume tamsolosin, chlorthalidone and amlodipine Hold gabapentine Stop taking diclofenac Sodium GEL  Keep follow-up appointment with orthopedics Return to Office to see Dr. Joya Ortega in 3 weeks.

## 2020-02-04 NOTE — Assessment & Plan Note (Signed)
Resume Flomax 

## 2020-02-04 NOTE — Progress Notes (Signed)
Has headache and neck pain States she has had some dizziness after taking Diclofenac cream.    Patrick Ortega [phone]

## 2020-02-04 NOTE — Assessment & Plan Note (Signed)
Recommended patient to see Dr.newton for joint injections

## 2020-02-04 NOTE — Assessment & Plan Note (Addendum)
Continue taking amlodipine and resume chlorthalidone.

## 2020-02-04 NOTE — Assessment & Plan Note (Signed)
Recommended to hold- off taking gabapentin Recommended to stop taking diclofinac due to medication side effects. Pathophysiology of disease explained.

## 2020-02-07 NOTE — Progress Notes (Signed)
Lab results are negative for rheumatoid arthritis associated antibodies. These were previously slightly positive but not any longer. Based on this and from our visit I do not think rheumatoid arthritis is causing his current joint problems and does not need new medicine for this.

## 2020-02-18 MED FILL — CHLORTHALIDONE 25 MG TAB: 25 | 90 days supply | Qty: 90 | Fill #0

## 2020-02-18 MED FILL — TAMSULOSIN HCL 0.4 MG CAP: 0.4 | 90 days supply | Qty: 90 | Fill #1

## 2020-02-18 MED FILL — AMLODIPINE BESYLATE 5 MG TA: 5 | 90 days supply | Qty: 90 | Fill #1

## 2020-03-18 ENCOUNTER — Encounter: Payer: Self-pay | Admitting: Critical Care Medicine

## 2020-03-18 ENCOUNTER — Ambulatory Visit: Payer: Medicare Other | Attending: Critical Care Medicine | Admitting: Critical Care Medicine

## 2020-03-18 ENCOUNTER — Other Ambulatory Visit: Payer: Self-pay

## 2020-03-18 ENCOUNTER — Other Ambulatory Visit: Payer: Self-pay | Admitting: Critical Care Medicine

## 2020-03-18 DIAGNOSIS — R519 Headache, unspecified: Secondary | ICD-10-CM | POA: Diagnosis not present

## 2020-03-18 DIAGNOSIS — N401 Enlarged prostate with lower urinary tract symptoms: Secondary | ICD-10-CM | POA: Diagnosis not present

## 2020-03-18 DIAGNOSIS — M5412 Radiculopathy, cervical region: Secondary | ICD-10-CM | POA: Diagnosis not present

## 2020-03-18 DIAGNOSIS — M0579 Rheumatoid arthritis with rheumatoid factor of multiple sites without organ or systems involvement: Secondary | ICD-10-CM | POA: Diagnosis not present

## 2020-03-18 DIAGNOSIS — Z87891 Personal history of nicotine dependence: Secondary | ICD-10-CM | POA: Insufficient documentation

## 2020-03-18 DIAGNOSIS — G8929 Other chronic pain: Secondary | ICD-10-CM | POA: Insufficient documentation

## 2020-03-18 DIAGNOSIS — Z79899 Other long term (current) drug therapy: Secondary | ICD-10-CM | POA: Insufficient documentation

## 2020-03-18 DIAGNOSIS — R350 Frequency of micturition: Secondary | ICD-10-CM | POA: Diagnosis not present

## 2020-03-18 DIAGNOSIS — I10 Essential (primary) hypertension: Secondary | ICD-10-CM | POA: Diagnosis not present

## 2020-03-18 DIAGNOSIS — R972 Elevated prostate specific antigen [PSA]: Secondary | ICD-10-CM

## 2020-03-18 DIAGNOSIS — M1612 Unilateral primary osteoarthritis, left hip: Secondary | ICD-10-CM | POA: Insufficient documentation

## 2020-03-18 DIAGNOSIS — R42 Dizziness and giddiness: Secondary | ICD-10-CM | POA: Diagnosis not present

## 2020-03-18 DIAGNOSIS — M549 Dorsalgia, unspecified: Secondary | ICD-10-CM | POA: Diagnosis not present

## 2020-03-18 MED ORDER — AMLODIPINE BESYLATE 5 MG PO TABS
5.0000 mg | ORAL_TABLET | Freq: Every day | ORAL | 2 refills | Status: DC
Start: 2020-03-18 — End: 2020-03-18

## 2020-03-18 MED ORDER — TAMSULOSIN HCL 0.4 MG PO CAPS: 0.4000 mg | ORAL_CAPSULE | Freq: Every day | ORAL | 3 refills | Status: DC

## 2020-03-18 MED ORDER — TRAMADOL HCL 50 MG PO TABS: 50.0000 mg | ORAL_TABLET | Freq: Three times a day (TID) | ORAL | 0 refills | Status: DC | PRN

## 2020-03-18 MED ORDER — TAMSULOSIN HCL 0.4 MG PO CAPS: 0.8000 mg | ORAL_CAPSULE | Freq: Every day | ORAL | 3 refills | Status: DC

## 2020-03-18 MED ORDER — CHLORTHALIDONE 25 MG PO TABS: 25.0000 mg | ORAL_TABLET | Freq: Every day | ORAL | 2 refills | Status: DC

## 2020-03-18 NOTE — Assessment & Plan Note (Signed)
Rheumatology says he does not have active rheumatoid arthritis just positive rheumatoid factor in his chronic pain is not related to RA

## 2020-03-18 NOTE — Progress Notes (Signed)
Subjective:    Patient ID: Patrick Ortega, male    DOB: August 27, 1940, 80 y.o.   MRN: [id number]  This is a 80 year old male who comes to the clinic today to reestablish care last seen by his former primary care provider who is left the practice in October.  At that visit the patient was to maintain amlodipine 5 mg daily and chlorthalidone daily  The patient also complained of multiple joint pain and cervical radiculopathy osteoarthritis of left hip.  The patient had been seen by orthopedist in the past but had not followed up and follow through on treatments.  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  Note today's visit is accomplished with the use of the patient's daughter who speaks fluent [place].  Patient continues to complain of neck and back pain dizziness.  He states the gabapentin does help some of the pain in the feet but does not help the right hip pain.  He has difficulty starting his urine flow.  The patient did receive a Covid vaccine series in March of but is due a booster  02/04/2020 Pt has seen Ortho and Rheum  Plan cervical facet injections.     HPI  Patrick Ortega is a 80 y.o. M with PMH significant for osteoarthritis and joint pain in the neck, the top, and back of the shoulders, left hip, and right ankle. Rates pain 6/10. The pain is worse with weight-bearing and active ROM. His neck pain radiates, and he experiences bilateral headaches. The patient endorses complaints about pain in the head and neck and reports stopping Diclofenac gel due to medication side effects. States that mediation makes him feel dizzy and weak. Inquired about joint injection in the left hip and right ankle.  Urine flow improved on tamsulosin.  Pt endorses he stopped several medications thinking course of treatment was limited [place]  nterpretor used Carlyle Lipa  #[id number]   03/18/2020 Patient is seen return follow-up for hypertension and chronic neck pain.  He was seen by rheumatology who did not feel he had rheumatologic cause for neck pain.  He needs a cervical MRI done will refer to orthopedics to consider neck injections.  Patient is yet to see urology and needs to see urology because he has severe BPH and may have prostate cancer with elevated PSA.  On arrival blood pressure is 123/74  Patient is taking 0.4 mg of Flomax.  Patient failed gabapentin due to side effects and cannot take nonsteroidals.     Past Medical History:  Diagnosis Date  . GERD (gastroesophageal reflux disease)   . Hypertension      Family History  Problem Relation Age of Onset  . Ulcers Mother      Social History   Socioeconomic History  . Marital status: Married    Spouse name: Not on file  . Number of children: Not on file  . Years of education: Not on file  . Highest education level: Not on file  Occupational History  . Not on file  Tobacco Use  . Smoking status: Former Smoker    Packs/day: 0.25    Years: 0.50    Pack years: 0.12    Types: Cigarettes  . Smokeless tobacco: Never Used  Vaping Use  . Vaping Use: Never used  Substance and Sexual Activity  . Alcohol use: Not Currently  . Drug  use: Not on file  . Sexual activity: Yes  Other Topics Concern  . Not on file  Social History Narrative  . Not on file   Social Determinants of Health   Financial Resource Strain: Not on file  Food Insecurity: Not on file  Transportation Needs: Not on file  Physical Activity: Not on file  Stress: Not on file  Social Connections: Not on file  Intimate Partner Violence: Not on file     No Known Allergies   Outpatient Medications Prior to Visit  Medication Sig Dispense Refill  . amLODipine (NORVASC) 5 MG tablet Take 1 tablet (5 mg total) by mouth daily. To lower blood pressure 30 tablet 4  . chlorthalidone (HYGROTON) 25 MG tablet Take 1 tablet (25 mg  total) by mouth daily. 90 tablet 0  . tamsulosin (FLOMAX) 0.4 MG CAPS capsule Take 1 capsule (0.4 mg total) by mouth daily. 30 capsule 3  . gabapentin (NEURONTIN) 300 MG capsule Take 1 capsule (300 mg total) by mouth 2 (two) times daily. For numbness in hands (Patient not taking: No sig reported) 60 capsule 3   No facility-administered medications prior to visit.     Review of Systems  Constitutional: Negative.  Negative for activity change.  HENT: Negative.   Eyes: Negative.  Negative for pain and discharge.  Respiratory: Negative.   Cardiovascular: Negative.   Gastrointestinal: Negative.   Endocrine: Negative.   Genitourinary: Negative for difficulty urinating.       Tamsulosin improved urination  Musculoskeletal: Positive for arthralgias, joint swelling and myalgias.  Neurological: Negative.   Psychiatric/Behavioral: Negative.        Objective:   Physical Exam Constitutional:      Appearance: Normal appearance.  HENT:     Head: Normocephalic.     Nose: Nose normal.     Mouth/Throat:     Mouth: Mucous membranes are moist.  Eyes:     Pupils: Pupils are equal, round, and reactive to light.  Cardiovascular:     Rate and Rhythm: Normal rate and regular rhythm.     Heart sounds: Normal heart sounds.  Pulmonary:     Effort: Pulmonary effort is normal.     Breath sounds: Normal breath sounds.  Abdominal:     General: There is no distension.     Tenderness: There is no abdominal tenderness.  Musculoskeletal:     Cervical back: No rigidity or tenderness.     Right lower leg: No edema.     Left lower leg: No edema.  Skin:    General: Skin is warm and dry.  Neurological:     Mental Status: He is alert and oriented to person, place, and time.  Psychiatric:        Mood and Affect: Mood normal.        Behavior: Behavior normal.     Vitals:   03/18/20 1444 03/18/20 1455  BP: (!) 150/79 123/74  Pulse: 100   Resp: 16   SpO2: 95%   Weight: 171 lb 6.4 oz (77.7 kg)        Assessment & Plan:  I personally reviewed all images and lab data in the [facility] system as well as any outside material available during this office visit and agree with the  radiology impressions.   Elevated PSA Elevated PSA with prostatism symptoms  Increase Flomax to 0.8 mg daily  Patient asked to call urology to make the appointment that was already requested early December  Rheumatoid arthritis with positive  rheumatoid factor [facility]) Rheumatology says he does not have active rheumatoid arthritis just positive rheumatoid factor in his chronic pain is not related to RA  Cervical radiculopathy Patient's daughter is going to call and make the MRI appointment that was already scheduled and December  Patient failed nonsteroidals and gabapentin  Until we further elucidate the neck pain and the patient can receive facet injections per orthopedics will give the patient a short course of tramadol 50 mg every 8 hours as needed  [place] drug database was checked and there are no other prescribers and no prior opioids prescribed  Essential hypertension Blood pressure under good control continue amlodipine and chlorthalidone  Urinary frequency As per elevated PSA assessment  Brylin was seen today for neck pain, back pain and headache.  Diagnoses and all orders for this visit:  Uncontrolled hypertension -     amLODipine (NORVASC) 5 MG tablet; Take 1 tablet (5 mg total) by mouth daily. To lower blood pressure -     chlorthalidone (HYGROTON) 25 MG tablet; Take 1 tablet (25 mg total) by mouth daily.  Elevated PSA  Rheumatoid arthritis involving multiple sites with positive rheumatoid factor (HCC)  Cervical radiculopathy  Essential hypertension  Urinary frequency  Other orders -     Discontinue: tamsulosin (FLOMAX) 0.4 MG CAPS capsule; Take 1 capsule (0.4 mg total) by mouth daily. -     traMADol (ULTRAM) 50 MG tablet; Take 1 tablet (50 mg total) by mouth every 8 (eight) hours as  needed for up to 5 days. -     tamsulosin (FLOMAX) 0.4 MG CAPS capsule; Take 2 capsules (0.8 mg total) by mouth daily.    Elevated PSA Elevated PSA with prostatism symptoms  Increase Flomax to 0.8 mg daily  Patient asked to call urology to make the appointment that was already requested early December  Rheumatoid arthritis with positive rheumatoid factor [facility]) Rheumatology says he does not have active rheumatoid arthritis just positive rheumatoid factor in his chronic pain is not related to RA  Cervical radiculopathy Patient's daughter is going to call and make the MRI appointment that was already scheduled and December  Patient failed nonsteroidals and gabapentin  Until we further elucidate the neck pain and the patient can receive facet injections per orthopedics will give the patient a short course of tramadol 50 mg every 8 hours as needed  [place] drug database was checked and there are no other prescribers and no prior opioids prescribed  Essential hypertension Blood pressure under good control continue amlodipine and chlorthalidone  Urinary frequency As per elevated PSA assessment   Nels was seen today for neck pain, back pain and headache.  Diagnoses and all orders for this visit:  Uncontrolled hypertension -     amLODipine (NORVASC) 5 MG tablet; Take 1 tablet (5 mg total) by mouth daily. To lower blood pressure -     chlorthalidone (HYGROTON) 25 MG tablet; Take 1 tablet (25 mg total) by mouth daily.  Elevated PSA  Rheumatoid arthritis involving multiple sites with positive rheumatoid factor (HCC)  Cervical radiculopathy  Essential hypertension  Urinary frequency  Other orders -     Discontinue: tamsulosin (FLOMAX) 0.4 MG CAPS capsule; Take 1 capsule (0.4 mg total) by mouth daily. -     traMADol (ULTRAM) 50 MG tablet; Take 1 tablet (50 mg total) by mouth every 8 (eight) hours as needed for up to 5 days. -     tamsulosin (FLOMAX) 0.4 MG CAPS capsule;  Take 2  capsules (0.8 mg total) by mouth daily.

## 2020-03-18 NOTE — Patient Instructions (Signed)
  Llame a [facility] y asegrese de tener una cita para evaluar su glndula prosttica.  Sin cambios en el medicamento para la presin arterial  Se ordenar una resonancia magntica de su cuello, por favor cumpla con la cita cuando se le d.  Comience tramadol 1 cada 8 horas segn sea necesario para el dolor le dimos 30 de estos enviados a nuestra farmacia  La presin arterial est bien controlada hoy  Volver a ver al Dr. Joya Gaskins 2 meses   Please call [facility] and make sure you have an appointment to evaluate your prostate gland  No change in blood pressure medicine  MRI of your neck will be ordered please follow through on the appointment when it is given  Begin tramadol 1 every 8 hours as needed for pain we gave you 30 of these sent to our pharmacy  Blood pressure is in good control today  Return to see Dr. Joya Gaskins 2 months

## 2020-03-18 NOTE — Assessment & Plan Note (Addendum)
Patient's daughter is going to call and make the MRI appointment that was already scheduled and December  Patient failed nonsteroidals and gabapentin  Until we further elucidate the neck pain and the patient can receive facet injections per orthopedics will give the patient a short course of tramadol 50 mg every 8 hours as needed  [place] drug database was checked and there are no other prescribers and no prior opioids prescribed

## 2020-03-18 NOTE — Assessment & Plan Note (Signed)
Blood pressure under good control continue amlodipine and chlorthalidone

## 2020-03-18 NOTE — Assessment & Plan Note (Signed)
As per elevated PSA assessment

## 2020-03-18 NOTE — Assessment & Plan Note (Signed)
Elevated PSA with prostatism symptoms  Increase Flomax to 0.8 mg daily  Patient asked to call urology to make the appointment that was already requested early December

## 2020-03-22 ENCOUNTER — Ambulatory Visit
Admission: RE | Admit: 2020-03-22 | Discharge: 2020-03-22 | Disposition: A | Discharge status: Home / Self Care | Payer: Medicare Other | Source: Ambulatory Visit | Attending: Family Medicine | Admitting: Family Medicine

## 2020-03-22 ENCOUNTER — Other Ambulatory Visit: Payer: Self-pay

## 2020-03-22 DIAGNOSIS — M4802 Spinal stenosis, cervical region: Secondary | ICD-10-CM | POA: Diagnosis not present

## 2020-03-22 DIAGNOSIS — M542 Cervicalgia: Secondary | ICD-10-CM

## 2020-03-24 ENCOUNTER — Telehealth: Payer: Self-pay | Admitting: Family Medicine

## 2020-03-24 NOTE — Telephone Encounter (Signed)
MRI scan of the cervical spine reveals disc bulging and bone spurring at multiple levels.  This results in narrowing of the spinal canal and also multiple nerve openings.  The most severe area is at C5-6 on the right.  There is arthritis in most of the joints on the back of the cervical spine as well.  Treatment options would include proceeding with injections per Dr. Ernestina Patches, or possibly trying physical therapy.  Surgical consult would be the last option.

## 2020-03-24 NOTE — Telephone Encounter (Signed)
MRI scan of the cervical spine reveals disc bulging and bone spurring at multiple levels. This results in narrowing of the spinal canal and also multiple nerve openings. The most severe area is at C5-6 on the right. There is arthritis in most of the joints on the back of the cervical spine as well.   Treatment options would include proceeding with injections per Dr. Ernestina Patches, or possibly trying physical therapy. Surgical consult would be the last option.

## 2020-03-25 ENCOUNTER — Other Ambulatory Visit: Payer: Self-pay

## 2020-03-25 ENCOUNTER — Telehealth: Payer: Self-pay | Admitting: Family Medicine

## 2020-03-25 DIAGNOSIS — M542 Cervicalgia: Secondary | ICD-10-CM

## 2020-03-25 NOTE — Telephone Encounter (Signed)
Pt granddaughter called to get pt mri results. Please call back [phone]

## 2020-03-25 NOTE — Telephone Encounter (Signed)
I called and left voice mail for Patrick Ortega to call me back regarding results.

## 2020-03-25 NOTE — Telephone Encounter (Signed)
I spoke with Vaughan Basta, the patient's granddaughter, advising her of the MRI results. She asked that we refer him for an injection with Dr. Ernestina Patches. Advised her that one of his assistants will call her to schedule this.

## 2020-03-25 NOTE — Telephone Encounter (Signed)
Duplicate - see message regarding MRI results.

## 2020-04-05 ENCOUNTER — Other Ambulatory Visit: Payer: Self-pay

## 2020-04-14 ENCOUNTER — Ambulatory Visit (INDEPENDENT_AMBULATORY_CARE_PROVIDER_SITE_OTHER): Payer: Medicare Other | Admitting: Physical Medicine and Rehabilitation

## 2020-04-14 ENCOUNTER — Ambulatory Visit: Payer: Self-pay

## 2020-04-14 ENCOUNTER — Encounter: Payer: Self-pay | Admitting: Physical Medicine and Rehabilitation

## 2020-04-14 ENCOUNTER — Other Ambulatory Visit: Payer: Self-pay

## 2020-04-14 VITALS — BP 143/85 | HR 98

## 2020-04-14 DIAGNOSIS — M5412 Radiculopathy, cervical region: Secondary | ICD-10-CM

## 2020-04-14 MED ORDER — BETAMETHASONE SOD PHOS & ACET 6 (3-3) MG/ML IJ SUSP
12.0000 mg | Freq: Once | INTRAMUSCULAR | Status: AC
  Administered 2020-04-14: 12 mg

## 2020-04-14 NOTE — Progress Notes (Signed)
Pt state neck pain mostly his right side. Pt state turning his head and bending over makes the pain worse. Pt state he take pain meds to help ease his pain.  Numeric Pain Rating Scale and Functional Assessment Average Pain 7   In the last MONTH (on 0-10 scale) has pain interfered with the following?  1. General activity like being  able to carry out your everyday physical activities such as walking, climbing stairs, carrying groceries, or moving a chair?  Rating(7)   +Driver, -BT, -Dye Allergies.

## 2020-04-14 NOTE — Patient Instructions (Signed)

## 2020-04-14 NOTE — Procedures (Signed)
Cervical Epidural Steroid Injection - Interlaminar Approach with Fluoroscopic Guidance  Patient: Patrick Ortega      Date of Birth: 01-09-1940 MRN: [id number] PCP: [name], MD      Visit Date: 04/14/2020   Universal Protocol:    Date/Time: 04/11/221:31 PM  Consent Given By: the patient  Position: PRONE  Additional Comments: Vital signs were monitored before and after the procedure. Patient was prepped and draped in the usual sterile fashion. The correct patient, procedure, and site was verified.   Injection Procedure Details:   Procedure diagnoses: Cervical radiculopathy [M54.12]    Meds Administered:  Meds ordered this encounter  Medications  . betamethasone acetate-betamethasone sodium phosphate (CELESTONE) injection 12 mg     Laterality: Right  Location/Site: C7-T1  Needle: 3.5 in., 20 ga. Tuohy  Needle Placement: Paramedian epidural space  Findings:  -Comments: Excellent flow of contrast into the epidural space.  Procedure Details: Using a paramedian approach from the side mentioned above, the region overlying the inferior lamina was localized under fluoroscopic visualization and the soft tissues overlying this structure were infiltrated with 4 ml. of 1% Lidocaine without Epinephrine. A # 20 gauge, Tuohy needle was inserted into the epidural space using a paramedian approach.  The epidural space was localized using loss of resistance along with contralateral oblique bi-planar fluoroscopic views.  After negative aspirate for air, blood, and CSF, a 2 ml. volume of Isovue-250 was injected into the epidural space and the flow of contrast was observed. Radiographs were obtained for documentation purposes.   The injectate was administered into the level noted above.  Additional Comments:  The patient tolerated the procedure well Dressing: 2 x 2 sterile gauze and Band-Aid    Post-procedure details: Patient was observed during the procedure. Post-procedure  instructions were reviewed.  Patient left the clinic in stable condition.

## 2020-04-14 NOTE — Progress Notes (Signed)
Quasean Frye - 80 y.o. male MRN [id number]  Date of birth: 10/30/1940  Office Visit Note: Visit Date: 04/14/2020 PCP: [name], MD Referred by: [name], MD  Subjective: Chief Complaint  Patient presents with  . Neck - Pain   HPI:  Billye Nydam is a 80 y.o. male who comes in today at the request of Dr. Eunice Blase for planned Right C7-T1 Lumbar epidural steroid injection with fluoroscopic guidance.  The patient has failed conservative care including home exercise, medications, time and activity modification.  This injection will be diagnostic and hopefully therapeutic.  Please see requesting physician notes for further details and justification. MRI reviewed with images and spine model.  MRI reviewed in the note below.  He does have some pain with rotation which seems to be more consistent with facet mediated pain.  He also gets some pain in his shoulders.  Depending on relief could look at facet joint blocks.    ROS Otherwise per HPI.  Assessment & Plan: Visit Diagnoses:    ICD-10-CM   1. Cervical radiculopathy  M54.12 XR C-ARM NO REPORT    Epidural Steroid injection    betamethasone acetate-betamethasone sodium phosphate (CELESTONE) injection 12 mg    Plan: No additional findings.   Meds & Orders:  Meds ordered this encounter  Medications  . betamethasone acetate-betamethasone sodium phosphate (CELESTONE) injection 12 mg    Orders Placed This Encounter  Procedures  . XR C-ARM NO REPORT  . Epidural Steroid injection    Follow-up: Return if symptoms worsen or fail to improve.   Procedures: No procedures performed  Cervical Epidural Steroid Injection - Interlaminar Approach with Fluoroscopic Guidance  Patient: Kaeo Jacome      Date of Birth: 05/18/1940 MRN: [id number] PCP: [name], MD      Visit Date: 04/14/2020   Universal Protocol:    Date/Time: 04/11/221:31 PM  Consent Given By: the patient  Position: PRONE  Additional  Comments: Vital signs were monitored before and after the procedure. Patient was prepped and draped in the usual sterile fashion. The correct patient, procedure, and site was verified.   Injection Procedure Details:   Procedure diagnoses: Cervical radiculopathy [M54.12]    Meds Administered:  Meds ordered this encounter  Medications  . betamethasone acetate-betamethasone sodium phosphate (CELESTONE) injection 12 mg     Laterality: Right  Location/Site: C7-T1  Needle: 3.5 in., 20 ga. Tuohy  Needle Placement: Paramedian epidural space  Findings:  -Comments: Excellent flow of contrast into the epidural space.  Procedure Details: Using a paramedian approach from the side mentioned above, the region overlying the inferior lamina was localized under fluoroscopic visualization and the soft tissues overlying this structure were infiltrated with 4 ml. of 1% Lidocaine without Epinephrine. A # 20 gauge, Tuohy needle was inserted into the epidural space using a paramedian approach.  The epidural space was localized using loss of resistance along with contralateral oblique bi-planar fluoroscopic views.  After negative aspirate for air, blood, and CSF, a 2 ml. volume of Isovue-250 was injected into the epidural space and the flow of contrast was observed. Radiographs were obtained for documentation purposes.   The injectate was administered into the level noted above.  Additional Comments:  The patient tolerated the procedure well Dressing: 2 x 2 sterile gauze and Band-Aid    Post-procedure details: Patient was observed during the procedure. Post-procedure instructions were reviewed.  Patient left the clinic in stable condition.     Clinical History: MRI CERVICAL  SPINE WITHOUT CONTRAST  TECHNIQUE: Multiplanar, multisequence MR imaging of the cervical spine was performed. No intravenous contrast was administered.  COMPARISON:  Prior radiograph from  12/14/2019.  FINDINGS: Alignment: Straightening with smooth reversal of the normal cervical lordosis. Trace anterolisthesis of C7 on T1.  Vertebrae: Vertebral body height maintained without acute or chronic fracture. Bone marrow signal intensity mildly heterogeneous but within normal limits. No worrisome osseous lesions. Mild reactive endplate change present about the C5-6 interspace. No other abnormal marrow edema.  Cord: Normal signal and morphology.  Posterior Fossa, vertebral arteries, paraspinal tissues: Visualized brain and posterior fossa within normal limits. Craniocervical junction normal. Paraspinous and prevertebral soft tissues within normal limits. Normal flow voids seen within the vertebral arteries bilaterally.  Disc levels:  C2-C3: Small central disc protrusion mildly indents the ventral thecal sac. No significant spinal stenosis or cord deformity. Foramina remain patent.  C3-C4: Shallow central disc protrusion indents the ventral thecal sac (series 6, image 8). Mild spinal stenosis with mild flattening of the ventral cord. Superimposed mild uncovertebral hypertrophy without significant foraminal encroachment.  C4-C5: Small central disc protrusion indents the ventral thecal sac (series 6, image 12). Mild spinal stenosis with mild flattening of the ventral cord, slightly greater on the right. Superimposed bilateral uncovertebral hypertrophy without significant foraminal narrowing.  C5-C6: Degenerative intervertebral disc space narrowing with diffuse disc osteophyte complex, eccentric to the right. Broad posterior component flattens and partially faces the ventral thecal sac. Secondary flattening of the ventral cord, greater on the right. No cord signal changes. Mild to moderate spinal stenosis. Severe right C6 foraminal narrowing. Left neural foramen remains patent. Small perineural cyst noted at the left neural foramen.  C6-C7: Degenerative  intervertebral disc space narrowing with diffuse disc osteophyte complex. Flattening and partial effacement of the ventral thecal sac with resultant mild spinal stenosis. Superimposed mild facet and ligament flavum hypertrophy. Moderate left worse than right C7 foraminal stenosis.  C7-T1: Trace anterolisthesis. No significant disc bulge. Right-sided facet degeneration. No canal or foraminal stenosis.  Visualized upper thoracic spine demonstrates no significant finding.  IMPRESSION: 1. 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 2. 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 3. Small central disc protrusions at C3-4 and C4-5 with resultant mild spinal stenosis.   Electronically Signed   By: Jeannine Boga M.D.   On: 03/24/2020 00:42     Objective:  VS:  HT:    WT:   BMI:     BP:(!) 143/85  HR:98bpm  TEMP: ( )  RESP:  Physical Exam Vitals and nursing note reviewed.  Constitutional:      General: He is not in acute distress.    Appearance: Normal appearance. He is not ill-appearing.  HENT:     Head: Normocephalic and atraumatic.     Right Ear: External ear normal.     Left Ear: External ear normal.  Eyes:     Extraocular Movements: Extraocular movements intact.  Cardiovascular:     Rate and Rhythm: Normal rate.     Pulses: Normal pulses.  Abdominal:     General: There is no distension.     Palpations: Abdomen is soft.  Musculoskeletal:        General: No signs of injury.     Cervical back: Neck supple. Tenderness present. No rigidity.     Right lower leg: No edema.  Left lower leg: No edema.     Comments: Patient has good strength in the upper extremities with 5 out of 5 strength in wrist extension long finger flexion APB.  No intrinsic hand muscle atrophy.  Negative Hoffmann's test.   Lymphadenopathy:     Cervical: No cervical adenopathy.  Skin:    Findings: No erythema or rash.  Neurological:     General: No focal deficit present.     Mental Status: He is alert and oriented to person, place, and time.     Sensory: No sensory deficit.     Motor: No weakness or abnormal muscle tone.     Coordination: Coordination normal.  Psychiatric:        Mood and Affect: Mood normal.        Behavior: Behavior normal.      Imaging: No results found.

## 2020-04-28 ENCOUNTER — Other Ambulatory Visit: Payer: Self-pay

## 2020-04-28 ENCOUNTER — Other Ambulatory Visit: Payer: Self-pay | Admitting: Critical Care Medicine

## 2020-04-28 DIAGNOSIS — R972 Elevated prostate specific antigen [PSA]: Secondary | ICD-10-CM | POA: Diagnosis not present

## 2020-04-28 MED FILL — Tamsulosin HCl Cap 0.4 MG: ORAL | 90 days supply | Qty: 180 | Fill #0 | Status: AC

## 2020-04-28 NOTE — Telephone Encounter (Signed)
Requested medication (s) are due for refill today: no  Requested medication (s) are on the active medication list: yes   Last refill: 03/18/2020   Future visit scheduled: yes   Notes to clinic: this refill cannot be delegated    Requested Prescriptions  Pending Prescriptions Disp Refills   traMADol (ULTRAM) 50 MG tablet 30 tablet 0    Sig: TAKE 1 TABLET (50 MG TOTAL) BY MOUTH EVERY 8 (EIGHT) HOURS AS NEEDED FOR UP TO 5 DAYS.      Not Delegated - Analgesics:  Opioid Agonists Failed - 04/28/2020  2:31 PM      Failed - This refill cannot be delegated      Failed - Urine Drug Screen completed in last 360 days      Passed - Valid encounter within last 6 months    Recent Outpatient Visits           1 month ago Uncontrolled hypertension   [place], [place]   2 months ago Essential hypertension   [place], [place]   4 months ago Essential hypertension   [place], [place]   6 months ago Uncontrolled hypertension   [place], [place]   10 months ago Uncontrolled hypertension   [place], [place]       Future Appointments             In 3 weeks Joya Gaskins Burnett Harry, MD [place]

## 2020-04-30 MED ORDER — TRAMADOL HCL 50 MG PO TABS
ORAL_TABLET | Freq: Three times a day (TID) | ORAL | 0 refills | Status: DC | PRN
  Filled 2020-04-30: qty 30, 10d supply, fill #0

## 2020-05-01 ENCOUNTER — Other Ambulatory Visit: Payer: Self-pay

## 2020-05-07 ENCOUNTER — Other Ambulatory Visit: Payer: Self-pay

## 2020-05-08 ENCOUNTER — Other Ambulatory Visit: Payer: Self-pay

## 2020-05-20 ENCOUNTER — Other Ambulatory Visit: Payer: Self-pay

## 2020-05-20 ENCOUNTER — Encounter: Payer: Self-pay | Admitting: Critical Care Medicine

## 2020-05-20 ENCOUNTER — Ambulatory Visit: Payer: Medicare Other | Attending: Critical Care Medicine | Admitting: Critical Care Medicine

## 2020-05-20 VITALS — BP 138/67 | HR 88 | Ht 61.5 in | Wt 168.8 lb

## 2020-05-20 DIAGNOSIS — M5412 Radiculopathy, cervical region: Secondary | ICD-10-CM | POA: Diagnosis not present

## 2020-05-20 DIAGNOSIS — D75839 Thrombocytosis, unspecified: Secondary | ICD-10-CM | POA: Diagnosis not present

## 2020-05-20 DIAGNOSIS — Z7901 Long term (current) use of anticoagulants: Secondary | ICD-10-CM | POA: Diagnosis not present

## 2020-05-20 DIAGNOSIS — M4802 Spinal stenosis, cervical region: Secondary | ICD-10-CM | POA: Diagnosis not present

## 2020-05-20 DIAGNOSIS — M1612 Unilateral primary osteoarthritis, left hip: Secondary | ICD-10-CM | POA: Diagnosis not present

## 2020-05-20 DIAGNOSIS — I10 Essential (primary) hypertension: Secondary | ICD-10-CM | POA: Diagnosis not present

## 2020-05-20 DIAGNOSIS — Z79899 Other long term (current) drug therapy: Secondary | ICD-10-CM | POA: Diagnosis not present

## 2020-05-20 DIAGNOSIS — D473 Essential (hemorrhagic) thrombocythemia: Secondary | ICD-10-CM | POA: Insufficient documentation

## 2020-05-20 DIAGNOSIS — R42 Dizziness and giddiness: Secondary | ICD-10-CM | POA: Insufficient documentation

## 2020-05-20 DIAGNOSIS — M50121 Cervical disc disorder at C4-C5 level with radiculopathy: Secondary | ICD-10-CM | POA: Insufficient documentation

## 2020-05-20 DIAGNOSIS — R21 Rash and other nonspecific skin eruption: Secondary | ICD-10-CM | POA: Diagnosis not present

## 2020-05-20 DIAGNOSIS — R350 Frequency of micturition: Secondary | ICD-10-CM | POA: Diagnosis not present

## 2020-05-20 DIAGNOSIS — Z87891 Personal history of nicotine dependence: Secondary | ICD-10-CM | POA: Insufficient documentation

## 2020-05-20 MED ORDER — CLOBETASOL PROPIONATE 0.05 % EX CREA
1.0000 "application " | TOPICAL_CREAM | Freq: Two times a day (BID) | CUTANEOUS | 0 refills | Status: DC
  Filled 2020-05-20 (×2): qty 30, 30d supply, fill #0

## 2020-05-20 MED ORDER — TRAMADOL HCL 50 MG PO TABS
ORAL_TABLET | Freq: Three times a day (TID) | ORAL | 0 refills | Status: DC | PRN
  Filled 2020-05-20: qty 30, 10d supply, fill #0

## 2020-05-20 MED FILL — Amlodipine Besylate Tab 5 MG (Base Equivalent): ORAL | 30 days supply | Qty: 30 | Fill #0 | Status: AC

## 2020-05-20 MED FILL — Chlorthalidone Tab 25 MG: ORAL | 30 days supply | Qty: 30 | Fill #0 | Status: AC

## 2020-05-20 NOTE — Assessment & Plan Note (Signed)
Blood pressure in good control continue amlodipine and chlorthalidone refill sent to the pharmacy

## 2020-05-20 NOTE — Assessment & Plan Note (Signed)
Follow-up with urology and continue Flomax

## 2020-05-20 NOTE — Progress Notes (Signed)
Has been having headache and neck pain.

## 2020-05-20 NOTE — Patient Instructions (Addendum)
No change in medications refills on tramadol sent to our pharmacy  Referral back to Dr. Ernestina Patches was made to have the left side of the neck injected for pain relief  Keep your October appointments with Dr. Louis Meckel for urology  Return to see Dr. Joya Gaskins 4 months  A therapeutic cream was sent to our pharmacy to help with irritation of the anterior lower extremities apply twice daily  Sin cambios en los resurtidos de medicamentos de tramadol enviados a nuestra farmacia  Se hizo una remisin al Dr. Ernestina Patches para que le inyectaran el lado izquierdo del cuello para [place] boy.  Mantenga sus citas de octubre con el Dr. Louis Meckel para urologa  Volver a ver al Dr. Joya Gaskins 4 meses  Se envi a nuestra farmacia una crema teraputica para ayudar con la irritacin [place]

## 2020-05-20 NOTE — Progress Notes (Signed)
Subjective:    Patient ID: Patrick Ortega, male    DOB: 1940/08/05, 80 y.o.   MRN: [id number]  This is a 80 year old male who comes to the clinic today to reestablish care last seen by his former primary care provider who is left the practice in October.  At that visit the patient was to maintain amlodipine 5 mg daily and chlorthalidone daily  The patient also complained of multiple joint pain and cervical radiculopathy osteoarthritis of left hip.  The patient had been seen by orthopedist in the past but had not followed up and follow through on treatments.  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  Note today's visit is accomplished with the use of the patient's daughter who speaks fluent [place].  Patient continues to complain of neck and back pain dizziness.  He states the gabapentin does help some of the pain in the feet but does not help the right hip pain.  He has difficulty starting his urine flow.  The patient did receive a Covid vaccine series in March of but is due a booster  02/04/2020 Pt has seen Ortho and Rheum  Plan cervical facet injections.     HPI  Patrick Ortega is a 80 y.o. M with PMH significant for osteoarthritis and joint pain in the neck, the top, and back of the shoulders, left hip, and right ankle. Rates pain 6/10. The pain is worse with weight-bearing and active ROM. His neck pain radiates, and he experiences bilateral headaches. The patient endorses complaints about pain in the head and neck and reports stopping Diclofenac gel due to medication side effects. States that mediation makes him feel dizzy and weak. Inquired about joint injection in the left hip and right ankle.  Urine flow improved on tamsulosin.  Pt endorses he stopped several medications thinking course of treatment was limited [place]ish  nterpretor used Carlyle Lipa  #[id number]   03/18/2020 Patient is seen return follow-up for hypertension and chronic neck pain.  He was seen by rheumatology who did not feel he had rheumatologic cause for neck pain.  He needs a cervical MRI done will refer to orthopedics to consider neck injections.  Patient is yet to see urology and needs to see urology because he has severe BPH and may have prostate cancer with elevated PSA.  On arrival blood pressure is 123/74  Patient is taking 0.4 mg of Flomax.  Patient failed gabapentin due to side effects and cannot take nonsteroidals.  05/20/2020 This visit was assisted by Martin [phone] on video interpretation service This patient seen return follow-up and recently saw urology and they are observing him for now with elevated PSA he is doing better with 0.8 mg of tamsulosin daily.  He had injections on the right side of the neck and this improved his right shoulder pain however he still having pain in the left side of the neck and the rehab physician suggested perhaps injection of the left side of the neck were in order.  Patient complains of tingling sensation and a rash in the both lower extremities in the anterior shin areas.  Patient otherwise and has no other complaints.  On arrival blood pressure is good at 138/67.   Past Medical History:  Diagnosis Date  . GERD (gastroesophageal reflux disease)   . Hypertension      Family History  Problem Relation Age of Onset  . Ulcers Mother      Social History   Socioeconomic History  . Marital status: Married    Spouse name: Not on file  . Number of children: Not on file  . Years of education: Not on file  . Highest education level: Not on file  Occupational History  . Not on file  Tobacco Use  . Smoking status: Former Smoker    Packs/day: 0.25    Years: 0.50    Pack years: 0.12    Types: Cigarettes  . Smokeless tobacco: Never Used  Vaping Use  . Vaping Use: Never used  Substance and Sexual  Activity  . Alcohol use: Not Currently  . Drug use: Not on file  . Sexual activity: Yes  Other Topics Concern  . Not on file  Social History Narrative  . Not on file   Social Determinants of Health   Financial Resource Strain: Not on file  Food Insecurity: Not on file  Transportation Needs: Not on file  Physical Activity: Not on file  Stress: Not on file  Social Connections: Not on file  Intimate Partner Violence: Not on file     No Known Allergies   Outpatient Medications Prior to Visit  Medication Sig Dispense Refill  . amLODipine (NORVASC) 5 MG tablet TAKE 1 TABLET (5 MG TOTAL) BY MOUTH DAILY. TO LOWER BLOOD PRESSURE 90 tablet 2  . chlorthalidone (HYGROTON) 25 MG tablet TAKE 1 TABLET (25 MG TOTAL) BY MOUTH DAILY. 90 tablet 2  . tamsulosin (FLOMAX) 0.4 MG CAPS capsule TAKE 2 CAPSULES (0.8 MG TOTAL) BY MOUTH DAILY. 60 capsule 3  . traMADol (ULTRAM) 50 MG tablet TAKE 1 TABLET (50 MG TOTAL) BY MOUTH EVERY 8 (EIGHT) HOURS AS NEEDED FOR UP TO 5 DAYS. 30 tablet 0   No facility-administered medications prior to visit.     Review of Systems  Constitutional: Negative.  Negative for activity change.  HENT: Negative.   Eyes: Negative.  Negative for pain and discharge.  Respiratory: Negative.   Cardiovascular: Negative.   Gastrointestinal: Negative.   Endocrine: Negative.   Genitourinary: Negative for difficulty urinating.       Tamsulosin improved urination  Musculoskeletal: Positive for arthralgias, joint swelling and myalgias.  Neurological: Positive for headaches.  Psychiatric/Behavioral: Negative.        Objective:   Physical Exam Constitutional:      Appearance: Normal appearance.  HENT:     Head: Normocephalic.     Nose: Nose normal.     Mouth/Throat:     Mouth: Mucous membranes are moist.  Eyes:     Pupils: Pupils are equal, round, and reactive to light.  Cardiovascular:     Rate and Rhythm: Normal rate and regular rhythm.     Heart sounds: Normal heart  sounds.  Pulmonary:     Effort: Pulmonary effort is normal.     Breath sounds: Normal breath sounds.  Abdominal:     General: There is no distension.     Tenderness: There is no abdominal tenderness.  Musculoskeletal:     Cervical back: No rigidity or tenderness.     Right lower leg: No edema.     Left lower leg: No edema.  Skin:    General: Skin is warm and dry.  Neurological:     Mental Status: He is alert and oriented to person, place, and time.  Psychiatric:        Mood and Affect: Mood normal.  Behavior: Behavior normal.     Vitals:   05/20/20 1338  BP: 138/67  Pulse: 88  SpO2: 97%  Weight: 168 lb 12.8 oz (76.6 kg)  Height: 5' 1.5" (1.562 m)     MRI Cspine IMPRESSION: 1. 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 2. 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 3. Small central disc protrusions at C3-4 and C4-5 with resultant mild spinal stenosis.    Assessment & Plan:  I personally reviewed all images and lab data in the [facility] system as well as any outside material available during this office visit and agree with the  radiology impressions.   Cervical radiculopathy Plan referral back to orthopedic rehab to see if he can have the left side of his neck injected and we did refill the tramadol  Essential hypertension Blood pressure in good control continue amlodipine and chlorthalidone refill sent to the pharmacy  Urinary frequency Follow-up with urology and continue Flomax  [place] was seen today for hypertension.  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 -     clobetasol cream (TEMOVATE) 0.05 %; Apply 1 application topically 2 (two) times daily.    Cervical radiculopathy Plan referral back to orthopedic rehab to see if he can have the left side of his neck injected and we did refill the tramadol  Essential hypertension Blood pressure in good control continue amlodipine and chlorthalidone refill sent to the pharmacy  Urinary frequency Follow-up with urology and continue Flomax   [place] was seen today for hypertension.  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 -     clobetasol cream (TEMOVATE) 0.05 %; Apply 1 application topically 2 (two) times daily.    25 minutes spent reviewing this patient's chart and interviewing the patient using Spanish interpreter moderate complexity of care plan developed

## 2020-05-20 NOTE — Assessment & Plan Note (Signed)
Plan referral back to orthopedic rehab to see if he can have the left side of his neck injected and we did refill the tramadol

## 2020-05-21 ENCOUNTER — Other Ambulatory Visit: Payer: Self-pay

## 2020-05-27 ENCOUNTER — Other Ambulatory Visit: Payer: Self-pay

## 2020-06-12 ENCOUNTER — Ambulatory Visit: Payer: Self-pay

## 2020-06-12 ENCOUNTER — Encounter: Payer: Self-pay | Admitting: Physical Medicine and Rehabilitation

## 2020-06-12 ENCOUNTER — Ambulatory Visit (INDEPENDENT_AMBULATORY_CARE_PROVIDER_SITE_OTHER): Payer: Medicare Other | Admitting: Physical Medicine and Rehabilitation

## 2020-06-12 ENCOUNTER — Other Ambulatory Visit: Payer: Self-pay

## 2020-06-12 VITALS — BP 164/72 | HR 91

## 2020-06-12 DIAGNOSIS — M5412 Radiculopathy, cervical region: Secondary | ICD-10-CM | POA: Diagnosis not present

## 2020-06-12 MED ORDER — BETAMETHASONE SOD PHOS & ACET 6 (3-3) MG/ML IJ SUSP
12.0000 mg | Freq: Once | INTRAMUSCULAR | Status: AC
  Administered 2020-06-12: 12 mg

## 2020-06-12 NOTE — Progress Notes (Signed)
Pt state neck pain that travels to his right shoulder. Npt state when he turns his head makes the pain worse. Pt state he massage and uses ice to help ease his pain. Pt has hx of inj on 04/14/20 pt state it helped.   Numeric Pain Rating Scale and Functional Assessment Average Pain 6   In the last MONTH (on 0-10 scale) has pain interfered with the following?  1. General activity like being  able to carry out your everyday physical activities such as walking, climbing stairs, carrying groceries, or moving a chair?  Rating(7)   +Driver, -BT, -Dye Allergies.

## 2020-06-12 NOTE — Progress Notes (Signed)
Patrick Ortega - 80 y.o. male MRN [id number]  Date of birth: Jun 05, 1940  Office Visit Note: Visit Date: 06/12/2020 PCP: [name], MD Referred by: [name], MD  Subjective: Chief Complaint  Patient presents with   Neck - Pain   Right Shoulder - Pain   HPI:  Patrick Ortega is a 80 y.o. male who comes in today Patient comes in today at the request of his primary care physician Dr. Asencion Noble for left-sided cervical epidural injection.  Prior right-sided injection directed by Dr. Eunice Blase as he has been following in the orthopedic office gave him relief of his right-sided symptoms but not left.  He gets some referral in the shoulder.  Nothing really paresthesias down the arms.  Depending on relief again would consider facet joint blocks.  ROS Otherwise per HPI.  Assessment & Plan: Visit Diagnoses:    ICD-10-CM   1. Cervical radiculopathy  M54.12 XR C-ARM NO REPORT    Epidural Steroid injection    betamethasone acetate-betamethasone sodium phosphate (CELESTONE) injection 12 mg      Plan: No additional findings.   Meds & Orders:  Meds ordered this encounter  Medications   betamethasone acetate-betamethasone sodium phosphate (CELESTONE) injection 12 mg    Orders Placed This Encounter  Procedures   XR C-ARM NO REPORT   Epidural Steroid injection    Follow-up: Return if symptoms worsen or fail to improve.   Procedures: No procedures performed      Clinical History: MRI CERVICAL SPINE WITHOUT CONTRAST   TECHNIQUE: Multiplanar, multisequence MR imaging of the cervical spine was performed. No intravenous contrast was administered.   COMPARISON:  Prior radiograph from 12/14/2019.   FINDINGS: Alignment: Straightening with smooth reversal of the normal cervical lordosis. Trace anterolisthesis of C7 on T1.   Vertebrae: Vertebral body height maintained without acute or chronic fracture. Bone marrow signal intensity mildly heterogeneous but within  normal limits. No worrisome osseous lesions. Mild reactive endplate change present about the C5-6 interspace. No other abnormal marrow edema.   Cord: Normal signal and morphology.   Posterior Fossa, vertebral arteries, paraspinal tissues: Visualized brain and posterior fossa within normal limits. Craniocervical junction normal. Paraspinous and prevertebral soft tissues within normal limits. Normal flow voids seen within the vertebral arteries bilaterally.   Disc levels:   C2-C3: Small central disc protrusion mildly indents the ventral thecal sac. No significant spinal stenosis or cord deformity. Foramina remain patent.   C3-C4: Shallow central disc protrusion indents the ventral thecal sac (series 6, image 8). Mild spinal stenosis with mild flattening of the ventral cord. Superimposed mild uncovertebral hypertrophy without significant foraminal encroachment.   C4-C5: Small central disc protrusion indents the ventral thecal sac (series 6, image 12). Mild spinal stenosis with mild flattening of the ventral cord, slightly greater on the right. Superimposed bilateral uncovertebral hypertrophy without significant foraminal narrowing.   C5-C6: Degenerative intervertebral disc space narrowing with diffuse disc osteophyte complex, eccentric to the right. Broad posterior component flattens and partially faces the ventral thecal sac. Secondary flattening of the ventral cord, greater on the right. No cord signal changes. Mild to moderate spinal stenosis. Severe right C6 foraminal narrowing. Left neural foramen remains patent. Small perineural cyst noted at the left neural foramen.   C6-C7: Degenerative intervertebral disc space narrowing with diffuse disc osteophyte complex. Flattening and partial effacement of the ventral thecal sac with resultant mild spinal stenosis. Superimposed mild facet and ligament flavum hypertrophy. Moderate left worse than right C7 foraminal stenosis.  C7-T1: Trace anterolisthesis. No significant disc bulge. Right-sided facet degeneration. No canal or foraminal stenosis.   Visualized upper thoracic spine demonstrates no significant finding.   IMPRESSION: 1. 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 2. 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 3. Small central disc protrusions at C3-4 and C4-5 with resultant mild spinal stenosis.     Electronically Signed   By: Jeannine Boga M.D.   On: 03/24/2020 00:42     Objective:  VS:  HT:    WT:   BMI:     BP:(!) 164/72  HR:91bpm  TEMP: ( )  RESP:  Physical Exam Vitals and nursing note reviewed.  Constitutional:      General: He is not in acute distress.    Appearance: Normal appearance. He is not ill-appearing.  HENT:     Head: Normocephalic and atraumatic.     Right Ear: External ear normal.     Left Ear: External ear normal.  Eyes:     Extraocular Movements: Extraocular movements intact.  Neck:     Comments: Decreased range of motion with right rotation and pain with right rotation. Cardiovascular:     Rate and Rhythm: Normal rate.     Pulses: Normal pulses.  Abdominal:     General: There is no distension.     Palpations: Abdomen is soft.  Musculoskeletal:        General: No signs of injury.     Cervical back: Neck supple. Tenderness present. No rigidity.     Right lower leg: No edema.     Left lower leg: No edema.     Comments: Patient has good strength in the upper extremities with 5 out of 5 strength in wrist extension long finger flexion APB.  No intrinsic hand muscle atrophy.  Negative Hoffmann's test.  Lymphadenopathy:     Cervical: No cervical adenopathy.  Skin:    Findings: No erythema or rash.  Neurological:     General: No focal deficit present.     Mental Status: He is alert  and oriented to person, place, and time.     Sensory: No sensory deficit.     Motor: No weakness or abnormal muscle tone.     Coordination: Coordination normal.  Psychiatric:        Mood and Affect: Mood normal.        Behavior: Behavior normal.     Imaging: No results found.

## 2020-06-12 NOTE — Patient Instructions (Signed)

## 2020-06-17 NOTE — Procedures (Signed)
Cervical Epidural Steroid Injection - Interlaminar Approach with Fluoroscopic Guidance  Patient: Patrick Ortega      Date of Birth: 1940-01-28 MRN: [id number] PCP: [name], MD      Visit Date: 06/12/2020   Universal Protocol:    Date/Time: 06/14/225:33 AM  Consent Given By: the patient  Position: PRONE  Additional Comments: Vital signs were monitored before and after the procedure. Patient was prepped and draped in the usual sterile fashion. The correct patient, procedure, and site was verified.   Injection Procedure Details:   Procedure diagnoses: Cervical radiculopathy [M54.12]    Meds Administered:  Meds ordered this encounter  Medications   betamethasone acetate-betamethasone sodium phosphate (CELESTONE) injection 12 mg     Laterality: Left  Location/Site: C7-T1  Needle: 3.5 in., 20 ga. Tuohy  Needle Placement: Paramedian epidural space  Findings:  -Comments: Excellent flow of contrast into the epidural space.  Procedure Details: Using a paramedian approach from the side mentioned above, the region overlying the inferior lamina was localized under fluoroscopic visualization and the soft tissues overlying this structure were infiltrated with 4 ml. of 1% Lidocaine without Epinephrine. A # 20 gauge, Tuohy needle was inserted into the epidural space using a paramedian approach.  The epidural space was localized using loss of resistance along with contralateral oblique bi-planar fluoroscopic views.  After negative aspirate for air, blood, and CSF, a 2 ml. volume of Isovue-250 was injected into the epidural space and the flow of contrast was observed. Radiographs were obtained for documentation purposes.   The injectate was administered into the level noted above.  Additional Comments:  The patient tolerated the procedure well Dressing: 2 x 2 sterile gauze and Band-Aid    Post-procedure details: Patient was observed during the procedure. Post-procedure  instructions were reviewed.  Patient left the clinic in stable condition.

## 2020-06-20 ENCOUNTER — Other Ambulatory Visit: Payer: Self-pay

## 2020-06-20 MED FILL — Amlodipine Besylate Tab 5 MG (Base Equivalent): ORAL | 30 days supply | Qty: 30 | Fill #1 | Status: AC

## 2020-06-20 MED FILL — Chlorthalidone Tab 25 MG: ORAL | 30 days supply | Qty: 30 | Fill #1 | Status: AC

## 2020-07-03 ENCOUNTER — Ambulatory Visit: Payer: Medicare Other | Admitting: Physician Assistant

## 2020-07-09 ENCOUNTER — Other Ambulatory Visit: Payer: Self-pay

## 2020-07-09 ENCOUNTER — Encounter: Payer: Self-pay | Admitting: Physician Assistant

## 2020-07-09 ENCOUNTER — Ambulatory Visit: Payer: Medicare Other | Admitting: Physician Assistant

## 2020-07-09 VITALS — BP 149/77 | HR 83 | Temp 98.2°F | Resp 18 | Ht 59.0 in | Wt 166.0 lb

## 2020-07-09 DIAGNOSIS — I10 Essential (primary) hypertension: Secondary | ICD-10-CM | POA: Diagnosis not present

## 2020-07-09 DIAGNOSIS — E559 Vitamin D deficiency, unspecified: Secondary | ICD-10-CM | POA: Diagnosis not present

## 2020-07-09 DIAGNOSIS — R42 Dizziness and giddiness: Secondary | ICD-10-CM

## 2020-07-09 DIAGNOSIS — M5412 Radiculopathy, cervical region: Secondary | ICD-10-CM | POA: Diagnosis not present

## 2020-07-09 DIAGNOSIS — Z13220 Encounter for screening for lipoid disorders: Secondary | ICD-10-CM | POA: Diagnosis not present

## 2020-07-09 MED ORDER — TRAMADOL HCL 50 MG PO TABS
ORAL_TABLET | Freq: Three times a day (TID) | ORAL | 0 refills | Status: DC | PRN
  Filled 2020-07-09: qty 30, 5d supply, fill #0

## 2020-07-09 NOTE — Patient Instructions (Signed)
Please call your orthopedic provider for follow up  We will call you with todays results   Kennieth Rad, PA-C Physician Assistant [place] [email or web address]   [facility] Dizziness Los mareos son un problema muy frecuente. Se trata de una sensacin de inestabilidad o desvanecimiento. Puede sentir que se va a desmayar. Los [employer] pueden provocarle una lesin si se tropieza o se cae. Las Engineer, manufacturing de todas las edades pueden sufrir Tree surgeon, Armed forces training and education officer es ms frecuente en los adultos [place]. Esta afeccin puede tener muchas causas, entre las que se pueden mencionar losmedicamentos, la deshidratacin y [place]. Siga estas instrucciones en su casa: Comida y bebida  Beba suficiente lquido como para Theatre manager la orina de color amarillo plido. Esto evita la deshidratacin. Trate de beber ms lquidos transparentes, como agua. No beba alcohol. Limite el consumo de cafena si el mdico se lo indica. Verifique los ingredientes y la informacin nutricional para saber si un alimento o una bebida contienen cafena. Limite el consumo de sal (sodio) si el mdico se lo indica. Verifique los ingredientes y la informacin nutricional para saber si un alimento o una bebida contienen sodio.  Actividad  Evite los movimientos rpidos. Levntese de las sillas con lentitud y apyese hasta sentirse bien. Por la maana, sintese primero a un lado de la cama. Cuando se sienta bien, pngase lentamente de pie mientras se sostiene de algo, hasta que sepa que ha logrado el equilibrio. Mueva las piernas con frecuencia si debe estar de pie en un lugar durante mucho tiempo. Mientras est de pie, contraiga y relaje los msculos de las piernas. No conduzca vehculos ni opere maquinaria si se siente mareado. Evite agacharse si se siente mareado. En su casa, coloque los objetos de modo que le resulte fcil alcanzarlos sin Office manager.  Estilo de vida No consuma ningn  producto que contenga nicotina o tabaco. Estos productos incluyen cigarrillos, tabaco para Higher education careers adviser y aparatos de vapeo, como los Psychologist, sport and exercise. Si necesita ayuda para dejar de fumar, consulte al [employer]. Trate de reducir el nivel de estrs con mtodos como el yoga o la meditacin. Hable con el mdico si necesita ayuda para controlar el nivel de estrs. Instrucciones generales Controle sus mareos para ver si hay cambios. Use los medicamentos de venta libre y los recetados solamente como se lo haya indicado el mdico. Hable con el mdico si cree que los medicamentos que est tomando son la causa de sus mareos. Infrmele a un amigo o a un familiar si se siente mareado. Pdale a esta persona que llame al mdico si observa cambios en su comportamiento. Concurra a [place]. Esto es importante. Comunquese con un mdico si: Los mareos no desaparecen o tiene sntomas nuevos. Los [employer] o la sensacin de Engineer, petroleum. Siente nuseas. Se le redujo la audicin. Tiene fiebre. Dolor o rigidez en el cuello. Los [employer] derivan en una lesin o una cada. Solicite ayuda de inmediato si: Vomita o tiene diarrea y no puede comer ni beber nada. Tiene dificultad para hablar, caminar, tragar o [employer], las [place]. Se siente constantemente dbil. Tiene cualquier tipo de sangrado. No piensa con claridad o tiene dificultad para armar oraciones. Es posible que un amigo o un familiar adviertan que esto ocurre. Tiene dolor de pecho, dolor abdominal, sudoracin o Risk manager. Tiene cambios en la visin o le aparece un dolor de cabeza intenso. Estos sntomas pueden representar un problema grave que constituye Engineer, maintenance (IT). No espere a  ver si los sntomas desaparecen. Solicite atencin mdica de inmediato. Comunquese con el servicio de emergencias de su localidad (911 en los [place]). No conduzca por sus propios medios [employer]. Resumen Los mareos son Ardelia Mems sensacin de inestabilidad o desvanecimiento. Esta afeccin puede tener muchas causas, entre las que se pueden [employer], la deshidratacin y [place]. Las Engineer, manufacturing de todas las edades pueden sufrir Tree surgeon, Armed forces training and education officer es ms frecuente en los adultos [place]. Beba suficiente lquido como para Theatre manager la orina de color amarillo plido. No beba alcohol. Evite los movimientos rpidos si se siente mareado. Controle sus mareos para ver si hay cambios. Esta informacin no tiene Marine scientist el consejo del mdico. Asegresede hacerle al mdico cualquier pregunta que tenga. Document Revised: 12/17/2019 Document Reviewed: 12/17/2019 Elsevier Patient Education  2022 [employer].

## 2020-07-09 NOTE — Progress Notes (Signed)
Patient reports increased pain in the head, neck and back related to his old age. Patient has eaten and taken medication today.

## 2020-07-09 NOTE — Progress Notes (Signed)
Established Patient Office Visit  Subjective:  Patient ID: Patrick Ortega, male    DOB: [birth year]  Age: 80 y.o. MRN: [id number]  CC:  Chief Complaint  Patient presents with   Back Pain     HPI Rameses Ou reports that he continues to have chronic head, neck and back pain.  Reports that when he uses the tramadol, it does offer some relief.  Reports that he has been working on stretching.  Denies numbness or tingling  Reports that he has been having episodes of dizziness while walking.  Reports it occurs almost every day.  States it started since the beginning of March 2022.  Reports that he is drinking 4-5 bottles of water a day.  Denies falls  States that he does not check his BP at home, denies any hypertensive sxs  Due to language barrier, an interpreter was present during the history-taking and subsequent discussion (and for part of the physical exam) with this patient.    Past Medical History:  Diagnosis Date   GERD (gastroesophageal reflux disease)    Hypertension     Past Surgical History:  Procedure Laterality Date   NO PAST SURGERIES      Family History  Problem Relation Age of Onset   Ulcers Mother     Social History   Socioeconomic History   Marital status: Married    Spouse name: Not on file   Number of children: Not on file   Years of education: Not on file   Highest education level: Not on file  Occupational History   Not on file  Tobacco Use   Smoking status: Former    Packs/day: 0.25    Years: 0.50    Pack years: 0.13    Types: Cigarettes   Smokeless tobacco: Never  Vaping Use   Vaping Use: Never used  Substance and Sexual Activity   Alcohol use: Not Currently   Drug use: Not on file   Sexual activity: Yes  Other Topics Concern   Not on file  Social History Narrative   Not on file   Social Determinants of Health   Financial Resource Strain: Not on file  Food Insecurity: Not on file  Transportation Needs: Not on file   Physical Activity: Not on file  Stress: Not on file  Social Connections: Not on file  Intimate Partner Violence: Not on file    Outpatient Medications Prior to Visit  Medication Sig Dispense Refill   amLODipine (NORVASC) 5 MG tablet TAKE 1 TABLET (5 MG TOTAL) BY MOUTH DAILY. TO LOWER BLOOD PRESSURE 90 tablet 2   chlorthalidone (HYGROTON) 25 MG tablet TAKE 1 TABLET (25 MG TOTAL) BY MOUTH DAILY. 90 tablet 2   clobetasol cream (TEMOVATE) 1.16 % Apply 1 application topically 2 (two) times daily. 30 g 0   tamsulosin (FLOMAX) 0.4 MG CAPS capsule TAKE 2 CAPSULES (0.8 MG TOTAL) BY MOUTH DAILY. 60 capsule 3   traMADol (ULTRAM) 50 MG tablet TAKE 1 TABLET (50 MG TOTAL) BY MOUTH EVERY 8 (EIGHT) HOURS AS NEEDED FOR UP TO 5 DAYS. 30 tablet 0   No facility-administered medications prior to visit.    No Known Allergies  ROS Review of Systems  Constitutional: Negative.   HENT: Negative.    Eyes: Negative.   Respiratory:  Negative for shortness of breath.   Cardiovascular:  Negative for chest pain.  Gastrointestinal: Negative.   Endocrine: Negative.   Genitourinary: Negative.   Musculoskeletal:  Positive for arthralgias, back pain, myalgias  and neck pain.  Skin: Negative.   Allergic/Immunologic: Negative.   Neurological:  Positive for dizziness. Negative for syncope, speech difficulty, weakness and headaches.  Hematological: Negative.   Psychiatric/Behavioral: Negative.       Objective:    Physical Exam Vitals and nursing note reviewed.  Constitutional:      General: He is not in acute distress.    Appearance: Normal appearance.  HENT:     Head: Normocephalic and atraumatic.     Right Ear: External ear normal.     Nose: Nose normal.     Mouth/Throat:     Mouth: Mucous membranes are moist.     Pharynx: Oropharynx is clear.  Eyes:     Extraocular Movements: Extraocular movements intact.     Conjunctiva/sclera: Conjunctivae normal.     Pupils: Pupils are equal, round, and reactive  to light.  Cardiovascular:     Rate and Rhythm: Normal rate and regular rhythm.     Pulses: Normal pulses.     Heart sounds: Normal heart sounds.  Pulmonary:     Effort: Pulmonary effort is normal.     Breath sounds: Normal breath sounds.  Musculoskeletal:     Cervical back: Normal range of motion and neck supple. Tenderness present.     Thoracic back: Tenderness present.     Lumbar back: Tenderness present. Decreased range of motion.  Skin:    General: Skin is warm and dry.  Neurological:     General: No focal deficit present.     Mental Status: He is alert and oriented to person, place, and time.  Psychiatric:        Mood and Affect: Mood normal.        Behavior: Behavior normal.        Thought Content: Thought content normal.        Judgment: Judgment normal.    BP (!) 149/77 (BP Location: Right Arm, Patient Position: Sitting, Cuff Size: Normal)   Pulse 83   Temp 98.2 F (36.8 C) (Oral)   Resp 18   Ht '4\' 11"'  (1.499 m)   Wt 166 lb (75.3 kg)   SpO2 93%   BMI 33.53 kg/m  Wt Readings from Last 3 Encounters:  07/09/20 166 lb (75.3 kg)  05/20/20 168 lb 12.8 oz (76.6 kg)  03/18/20 171 lb 6.4 oz (77.7 kg)     Health Maintenance Due  Topic Date Due   Zoster Vaccines- Shingrix (1 of 2) Never done   COVID-19 Vaccine (4 - Booster for Pfizer series) 04/14/2020    There are no preventive care reminders to display for this patient.  Lab Results  Component Value Date   TSH 2.080 04/25/2019   Lab Results  Component Value Date   WBC 13.1 (H) 07/09/2020   HGB 14.5 07/09/2020   HCT 41.8 07/09/2020   MCV 85 07/09/2020   PLT 416 07/09/2020   Lab Results  Component Value Date   NA 139 07/09/2020   K 3.4 (L) 07/09/2020   CO2 25 10/12/2019   GLUCOSE 89 07/09/2020   BUN 22 07/09/2020   CREATININE 1.01 07/09/2020   BILITOT 0.3 07/09/2020   ALKPHOS 118 07/09/2020   AST 26 07/09/2020   ALT 15 04/25/2019   PROT 8.0 07/09/2020   ALBUMIN 4.7 (H) 07/09/2020   CALCIUM 10.1  07/09/2020   EGFR 75 07/09/2020   Lab Results  Component Value Date   CHOL 195 05/09/2019   Lab Results  Component Value Date  HDL 64 05/09/2019   Lab Results  Component Value Date   LDLCALC 112 (H) 05/09/2019   Lab Results  Component Value Date   TRIG 110 05/09/2019   Lab Results  Component Value Date   CHOLHDL 3.0 05/09/2019   No results found for: HGBA1C    Assessment & Plan:   Problem List Items Addressed This Visit       Cardiovascular and Mediastinum   Essential hypertension - Primary   Relevant Orders   CBC with Differential/Platelet (Completed)   Comp. Metabolic Panel (12) (Completed)     Nervous and Auditory   Cervical radiculopathy   Relevant Medications   traMADol (ULTRAM) 50 MG tablet     Other   Elevated PSA   Other Visit Diagnoses     Dizziness and giddiness       Screening, lipid       Relevant Orders   Lipid panel   Vitamin D deficiency       Relevant Orders   Vitamin D, 25-hydroxy (Completed)       Meds ordered this encounter  Medications   traMADol (ULTRAM) 50 MG tablet    Sig: TAKE 1 TABLET (50 MG TOTAL) BY MOUTH EVERY 8 (EIGHT) HOURS AS NEEDED FOR UP TO 5 DAYS.    Dispense:  30 tablet    Refill:  0    Order Specific Question:   Supervising Provider    Answer:   Joya Gaskins, PATRICK E [[id number]]   1. Cervical radiculopathy Patient education given to continue supportive care.  Patient was seen by orthopedic provider on June 12, 2020, patient encouraged to follow-up with orthopedic provider.  Red flags given for prompt reevaluation.  Patient understands and agrees. - traMADol (ULTRAM) 50 MG tablet; TAKE 1 TABLET (50 MG TOTAL) BY MOUTH EVERY 8 (EIGHT) HOURS AS NEEDED FOR UP TO 5 DAYS.  Dispense: 30 tablet; Refill: 0  2. Dizziness and giddiness Labs completed today.  Patient education given on supportive care  3. Essential hypertension  - CBC with Differential/Platelet - Comp. Metabolic Panel (12)   4. Screening, lipid  - Lipid  panel  5. Vitamin D deficiency  - Vitamin D, 25-hydroxy   I have reviewed the patient's medical history (PMH, PSH, Social History, Family History, Medications, and allergies) , and have been updated if relevant. I spent 33 minutes reviewing chart and  face to face time with patient.    Follow-up: Return if symptoms worsen or fail to improve.    Loraine Grip Mayers, PA-C

## 2020-07-10 ENCOUNTER — Other Ambulatory Visit: Payer: Self-pay

## 2020-07-10 DIAGNOSIS — R42 Dizziness and giddiness: Secondary | ICD-10-CM | POA: Insufficient documentation

## 2020-07-10 DIAGNOSIS — E559 Vitamin D deficiency, unspecified: Secondary | ICD-10-CM | POA: Insufficient documentation

## 2020-07-10 LAB — COMP. METABOLIC PANEL (12)
AST: 26 IU/L (ref 0–40)
Albumin/Globulin Ratio: 1.4 (ref 1.2–2.2)
Albumin: 4.7 g/dL — ABNORMAL HIGH (ref 3.6–4.6)
Alkaline Phosphatase: 118 IU/L (ref 44–121)
BUN/Creatinine Ratio: 22 (ref 10–24)
BUN: 22 mg/dL (ref 8–27)
Bilirubin Total: 0.3 mg/dL (ref 0.0–1.2)
Calcium: 10.1 mg/dL (ref 8.6–10.2)
Chloride: 98 mmol/L (ref 96–106)
Creatinine, Ser: 1.01 mg/dL (ref 0.76–1.27)
Globulin, Total: 3.3 g/dL (ref 1.5–4.5)
Glucose: 89 mg/dL (ref 65–99)
Potassium: 3.4 mmol/L — ABNORMAL LOW (ref 3.5–5.2)
Sodium: 139 mmol/L (ref 134–144)
Total Protein: 8 g/dL (ref 6.0–8.5)
eGFR: 75 mL/min/{1.73_m2} (ref >59)

## 2020-07-10 LAB — CBC WITH DIFFERENTIAL/PLATELET
Basophils Absolute: 0.1 10*3/uL (ref 0.0–0.2)
Basos: 1 %
EOS (ABSOLUTE): 0.1 10*3/uL (ref 0.0–0.4)
Eos: 1 %
Hematocrit: 41.8 % (ref 37.5–51.0)
Hemoglobin: 14.5 g/dL (ref 13.0–17.7)
Immature Grans (Abs): 0 10*3/uL (ref 0.0–0.1)
Immature Granulocytes: 0 %
Lymphocytes Absolute: 4.1 10*3/uL — ABNORMAL HIGH (ref 0.7–3.1)
Lymphs: 31 %
MCH: 29.5 pg (ref 26.6–33.0)
MCHC: 34.7 g/dL (ref 31.5–35.7)
MCV: 85 fL (ref 79–97)
Monocytes Absolute: 1.2 10*3/uL — ABNORMAL HIGH (ref 0.1–0.9)
Monocytes: 9 %
Neutrophils Absolute: 7.7 10*3/uL — ABNORMAL HIGH (ref 1.4–7.0)
Neutrophils: 58 %
Platelets: 416 10*3/uL (ref 150–450)
RBC: 4.91 x10E6/uL (ref 4.14–5.80)
RDW: 13.3 % (ref 11.6–15.4)
WBC: 13.1 10*3/uL — ABNORMAL HIGH (ref 3.4–10.8)

## 2020-07-10 LAB — VITAMIN D 25 HYDROXY (VIT D DEFICIENCY, FRACTURES): Vit D, 25-Hydroxy: 29.9 ng/mL — ABNORMAL LOW (ref 30.0–100.0)

## 2020-07-11 ENCOUNTER — Other Ambulatory Visit: Payer: Self-pay

## 2020-07-14 ENCOUNTER — Telehealth: Payer: Self-pay | Admitting: *Deleted

## 2020-07-14 NOTE — Telephone Encounter (Signed)
Medical Assistant used [employer] to contact patient.  Interpreter Name: Berle Mull #: [id number] Patients daughter verified DOB Patient is aware of needing OTC 2000 units of vitamin d and to have WBC monitored at the next visit for chronic elevated labs.

## 2020-07-14 NOTE — Telephone Encounter (Signed)
-----   Message from Kennieth Rad, [place] sent at 07/14/2020 10:07 AM EDT ----- Please call patient and let him know that his kidney function and liver function are within normal limits.  He does have been elevated white blood cell count, however this does appear to be chronic, does not show signs of anemia.  His vitamin D level is very slightly decreased.  I do encourage him to take 2000 international units of vitamin D on a daily basis, he will purchase this over-the-counter.

## 2020-07-17 ENCOUNTER — Telehealth: Payer: Self-pay | Admitting: Physical Medicine and Rehabilitation

## 2020-07-17 NOTE — Telephone Encounter (Signed)
Patient had left C7-T1 IL on 06/12/20. Ok to repeat if helped, same problem/side, and no new injury?

## 2020-07-18 NOTE — Telephone Encounter (Signed)
Left message #1 to discuss.

## 2020-07-21 ENCOUNTER — Other Ambulatory Visit: Payer: Self-pay

## 2020-07-21 MED FILL — Chlorthalidone Tab 25 MG: ORAL | 90 days supply | Qty: 90 | Fill #2 | Status: AC

## 2020-07-21 MED FILL — Amlodipine Besylate Tab 5 MG (Base Equivalent): ORAL | 90 days supply | Qty: 90 | Fill #2 | Status: AC

## 2020-07-30 NOTE — Telephone Encounter (Signed)
Patient reports that injection did help. Scheduled for repeat on 8/1 at 1430 with driver and no blood thinners.

## 2020-08-04 ENCOUNTER — Ambulatory Visit (INDEPENDENT_AMBULATORY_CARE_PROVIDER_SITE_OTHER): Payer: Medicare Other | Admitting: Physical Medicine and Rehabilitation

## 2020-08-04 ENCOUNTER — Other Ambulatory Visit: Payer: Self-pay

## 2020-08-04 ENCOUNTER — Encounter: Payer: Self-pay | Admitting: Physical Medicine and Rehabilitation

## 2020-08-04 ENCOUNTER — Ambulatory Visit: Payer: Self-pay

## 2020-08-04 VITALS — BP 146/72 | HR 66

## 2020-08-04 DIAGNOSIS — M5412 Radiculopathy, cervical region: Secondary | ICD-10-CM | POA: Diagnosis not present

## 2020-08-04 MED ORDER — BETAMETHASONE SOD PHOS & ACET 6 (3-3) MG/ML IJ SUSP
12.0000 mg | Freq: Once | INTRAMUSCULAR | Status: AC
  Administered 2020-08-04: 12 mg

## 2020-08-04 NOTE — Progress Notes (Signed)
Patrick Ortega - 81 y.o. male MRN [id number]  Date of birth: 1940/03/06  Office Visit Note: Visit Date: 08/04/2020 PCP: [name], MD Referred by: [name], MD  Subjective: Chief Complaint  Patient presents with   Neck - Pain   Right Shoulder - Pain   Left Shoulder - Pain   Head - Pain   HPI:  Patrick Ortega is a 80 y.o. male who comes in today for planned repeat Right C7-T1  Cervical Interlaminar epidural steroid injection with fluoroscopic guidance.  The patient has failed conservative care including home exercise, medications, time and activity modification.  This injection will be diagnostic and hopefully therapeutic.  Please see requesting physician notes for further details and justification. Patient received more than 50% pain relief from prior injection.   Referring: Dr. Legrand Como Hilts   ROS Otherwise per HPI.  Assessment & Plan: Visit Diagnoses:    ICD-10-CM   1. Cervical radiculopathy  M54.12 XR C-ARM NO REPORT    Epidural Steroid injection    betamethasone acetate-betamethasone sodium phosphate (CELESTONE) injection 12 mg      Plan: No additional findings.   Meds & Orders:  Meds ordered this encounter  Medications   betamethasone acetate-betamethasone sodium phosphate (CELESTONE) injection 12 mg    Orders Placed This Encounter  Procedures   XR C-ARM NO REPORT   Epidural Steroid injection    Follow-up: Return if symptoms worsen or fail to improve.   Procedures: No procedures performed  Cervical Epidural Steroid Injection - Interlaminar Approach with Fluoroscopic Guidance  Patient: Kathy Marder      Date of Birth: 1940-02-22 MRN: [id number] PCP: [name], MD      Visit Date: 08/04/2020   Universal Protocol:    Date/Time: 08/08/226:13 AM  Consent Given By: the patient  Position: PRONE  Additional Comments: Vital signs were monitored before and after the procedure. Patient was prepped and draped in the usual sterile  fashion. The correct patient, procedure, and site was verified.   Injection Procedure Details:   Procedure diagnoses: Cervical radiculopathy [M54.12]    Meds Administered:  Meds ordered this encounter  Medications   betamethasone acetate-betamethasone sodium phosphate (CELESTONE) injection 12 mg     Laterality: Right  Location/Site: C7-T1  Needle: 3.5 in., 20 ga. Tuohy  Needle Placement: Paramedian epidural space  Findings:  -Comments: Excellent flow of contrast into the epidural space.  Procedure Details: Using a paramedian approach from the side mentioned above, the region overlying the inferior lamina was localized under fluoroscopic visualization and the soft tissues overlying this structure were infiltrated with 4 ml. of 1% Lidocaine without Epinephrine. A # 20 gauge, Tuohy needle was inserted into the epidural space using a paramedian approach.  The epidural space was localized using loss of resistance along with contralateral oblique bi-planar fluoroscopic views.  After negative aspirate for air, blood, and CSF, a 2 ml. volume of Isovue-250 was injected into the epidural space and the flow of contrast was observed. Radiographs were obtained for documentation purposes.   The injectate was administered into the level noted above.  Additional Comments:  The patient tolerated the procedure well Dressing: 2 x 2 sterile gauze and Band-Aid    Post-procedure details: Patient was observed during the procedure. Post-procedure instructions were reviewed.  Patient left the clinic in stable condition.   Clinical History: MRI CERVICAL SPINE WITHOUT CONTRAST   TECHNIQUE: Multiplanar, multisequence MR imaging of the cervical spine was performed. No intravenous contrast was administered.  COMPARISON:  Prior radiograph from 12/14/2019.   FINDINGS: Alignment: Straightening with smooth reversal of the normal cervical lordosis. Trace anterolisthesis of C7 on T1.    Vertebrae: Vertebral body height maintained without acute or chronic fracture. Bone marrow signal intensity mildly heterogeneous but within normal limits. No worrisome osseous lesions. Mild reactive endplate change present about the C5-6 interspace. No other abnormal marrow edema.   Cord: Normal signal and morphology.   Posterior Fossa, vertebral arteries, paraspinal tissues: Visualized brain and posterior fossa within normal limits. Craniocervical junction normal. Paraspinous and prevertebral soft tissues within normal limits. Normal flow voids seen within the vertebral arteries bilaterally.   Disc levels:   C2-C3: Small central disc protrusion mildly indents the ventral thecal sac. No significant spinal stenosis or cord deformity. Foramina remain patent.   C3-C4: Shallow central disc protrusion indents the ventral thecal sac (series 6, image 8). Mild spinal stenosis with mild flattening of the ventral cord. Superimposed mild uncovertebral hypertrophy without significant foraminal encroachment.   C4-C5: Small central disc protrusion indents the ventral thecal sac (series 6, image 12). Mild spinal stenosis with mild flattening of the ventral cord, slightly greater on the right. Superimposed bilateral uncovertebral hypertrophy without significant foraminal narrowing.   C5-C6: Degenerative intervertebral disc space narrowing with diffuse disc osteophyte complex, eccentric to the right. Broad posterior component flattens and partially faces the ventral thecal sac. Secondary flattening of the ventral cord, greater on the right. No cord signal changes. Mild to moderate spinal stenosis. Severe right C6 foraminal narrowing. Left neural foramen remains patent. Small perineural cyst noted at the left neural foramen.   C6-C7: Degenerative intervertebral disc space narrowing with diffuse disc osteophyte complex. Flattening and partial effacement of the ventral thecal sac with  resultant mild spinal stenosis. Superimposed mild facet and ligament flavum hypertrophy. Moderate left worse than right C7 foraminal stenosis.   C7-T1: Trace anterolisthesis. No significant disc bulge. Right-sided facet degeneration. No canal or foraminal stenosis.   Visualized upper thoracic spine demonstrates no significant finding.   IMPRESSION: 1. 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 2. 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 3. Small central disc protrusions at C3-4 and C4-5 with resultant mild spinal stenosis.     Electronically Signed   By: Jeannine Boga M.D.   On: 03/24/2020 00:42     Objective:  VS:  HT:    WT:   BMI:     BP:(!) 146/72  HR:66bpm  TEMP: ( )  RESP:  Physical Exam Vitals and nursing note reviewed.  Constitutional:      General: He is not in acute distress.    Appearance: Normal appearance. He is not ill-appearing.  HENT:     Head: Normocephalic and atraumatic.     Right Ear: External ear normal.     Left Ear: External ear normal.  Eyes:     Extraocular Movements: Extraocular movements intact.  Cardiovascular:     Rate and Rhythm: Normal rate.     Pulses: Normal pulses.  Abdominal:     General: There is no distension.     Palpations: Abdomen is soft.  Musculoskeletal:        General: No signs of injury.     Cervical back: Neck supple. Tenderness present. No rigidity.     Right lower leg: No edema.     Left lower leg:  No edema.     Comments: Patient has good strength in the upper extremities with 5 out of 5 strength in wrist extension long finger flexion APB.  No intrinsic hand muscle atrophy.  Negative Hoffmann's test.  Lymphadenopathy:     Cervical: No cervical adenopathy.  Skin:    Findings: No erythema or rash.  Neurological:     General: No focal  deficit present.     Mental Status: He is alert and oriented to person, place, and time.     Sensory: No sensory deficit.     Motor: No weakness or abnormal muscle tone.     Coordination: Coordination normal.  Psychiatric:        Mood and Affect: Mood normal.        Behavior: Behavior normal.     Imaging: No results found.

## 2020-08-04 NOTE — Patient Instructions (Signed)

## 2020-08-04 NOTE — Progress Notes (Signed)
Pt state neck pain that travels to both shoulders. Pt state he his headaches with his pain. Pt state turning and move his neck makes the pain worse. Pt state he takes over the counter pain meds to help ease his pain. Pt has hx of inj on 06/12/20 pt state it helped.  Numeric Pain Rating Scale and Functional Assessment Average Pain 7   In the last MONTH (on 0-10 scale) has pain interfered with the following?  1. General activity like being  able to carry out your everyday physical activities such as walking, climbing stairs, carrying groceries, or moving a chair?  Rating(8)   +Driver, -BT, -Dye Allergies.

## 2020-08-11 NOTE — Procedures (Signed)
Cervical Epidural Steroid Injection - Interlaminar Approach with Fluoroscopic Guidance  Patient: Patrick Ortega      Date of Birth: [birth year] MRN: [id number] PCP: [name], MD      Visit Date: 08/04/2020   Universal Protocol:    Date/Time: 08/08/226:13 AM  Consent Given By: the patient  Position: PRONE  Additional Comments: Vital signs were monitored before and after the procedure. Patient was prepped and draped in the usual sterile fashion. The correct patient, procedure, and site was verified.   Injection Procedure Details:   Procedure diagnoses: Cervical radiculopathy [M54.12]    Meds Administered:  Meds ordered this encounter  Medications   betamethasone acetate-betamethasone sodium phosphate (CELESTONE) injection 12 mg     Laterality: Right  Location/Site: C7-T1  Needle: 3.5 in., 20 ga. Tuohy  Needle Placement: Paramedian epidural space  Findings:  -Comments: Excellent flow of contrast into the epidural space.  Procedure Details: Using a paramedian approach from the side mentioned above, the region overlying the inferior lamina was localized under fluoroscopic visualization and the soft tissues overlying this structure were infiltrated with 4 ml. of 1% Lidocaine without Epinephrine. A # 20 gauge, Tuohy needle was inserted into the epidural space using a paramedian approach.  The epidural space was localized using loss of resistance along with contralateral oblique bi-planar fluoroscopic views.  After negative aspirate for air, blood, and CSF, a 2 ml. volume of Isovue-250 was injected into the epidural space and the flow of contrast was observed. Radiographs were obtained for documentation purposes.   The injectate was administered into the level noted above.  Additional Comments:  The patient tolerated the procedure well Dressing: 2 x 2 sterile gauze and Band-Aid    Post-procedure details: Patient was observed during the procedure. Post-procedure  instructions were reviewed.  Patient left the clinic in stable condition.

## 2020-08-31 NOTE — Progress Notes (Deleted)
Subjective:    Patient ID: Patrick Ortega, male    DOB: 1940/12/31, 80 y.o.   MRN: [id number]  This is a 80 year old male who comes to the clinic today to reestablish care last seen by his former primary care provider who is left the practice in October.  At that visit the patient was to maintain amlodipine 5 mg daily and chlorthalidone daily  The patient also complained of multiple joint pain and cervical radiculopathy osteoarthritis of left hip.  The patient had been seen by orthopedist in the past but had not followed up and follow through on treatments.  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  Note today's visit is accomplished with the use of the patient's daughter who speaks fluent [place].  Patient continues to complain of neck and back pain dizziness.  He states the gabapentin does help some of the pain in the feet but does not help the right hip pain.  He has difficulty starting his urine flow.  The patient did receive a Covid vaccine series in March of but is due a booster  02/04/2020 Pt has seen Ortho and Rheum  Plan cervical facet injections.     HPI  Patrick Ortega is a 80 y.o. M with PMH significant for osteoarthritis and joint pain in the neck, the top, and back of the shoulders, left hip, and right ankle. Rates pain 6/10. The pain is worse with weight-bearing and active ROM. His neck pain radiates, and he experiences bilateral headaches. The patient endorses complaints about pain in the head and neck and reports stopping Diclofenac gel due to medication side effects. States that mediation makes him feel dizzy and weak. Inquired about joint injection in the left hip and right ankle.  Urine flow improved on tamsulosin.  Pt endorses he stopped several medications thinking course of treatment was limited [place]  nterpretor used Carlyle Lipa  [id number]   03/18/2020 Patient is seen return follow-up for hypertension and chronic neck pain.  He was seen by rheumatology who did not feel he had rheumatologic cause for neck pain.  He needs a cervical MRI done will refer to orthopedics to consider neck injections.  Patient is yet to see urology and needs to see urology because he has severe BPH and may have prostate cancer with elevated PSA.  On arrival blood pressure is 123/74  Patient is taking 0.4 mg of Flomax.  Patient failed gabapentin due to side effects and cannot take nonsteroidals.  05/20/2020 This visit was assisted by [place] [phone] on video interpretation service This patient seen return follow-up and recently saw urology and they are observing him for now with elevated PSA he is doing better with 0.8 mg of tamsulosin daily.  He had injections on the right side of the neck and this improved his right shoulder pain however he still having pain in the left side of the neck and the rehab physician suggested perhaps injection of the left side of the neck were in order.  Patient complains of tingling sensation and a rash in the both lower extremities in the anterior shin areas.  Patient otherwise and has no other complaints.  On arrival blood pressure is good at 138/67.  8/29 Here for neck pain. Had cervical injection 08/04/20 per ortho. Needs FLU VAX  Cervical radiculopathy Plan referral back to orthopedic rehab to  see if he can have the left side of his neck injected and we did refill the tramadol  Essential hypertension Blood pressure in good control continue amlodipine and chlorthalidone refill sent to the pharmacy  Urinary frequency Follow-up with urology and continue Flomax   Past Medical History:  Diagnosis Date   GERD (gastroesophageal reflux disease)    Hypertension      Family History  Problem Relation Age of Onset   Ulcers Mother      Social History   Socioeconomic History   Marital  status: Married    Spouse name: Not on file   Number of children: Not on file   Years of education: Not on file   Highest education level: Not on file  Occupational History   Not on file  Tobacco Use   Smoking status: Former    Packs/day: 0.25    Years: 0.50    Pack years: 0.13    Types: Cigarettes   Smokeless tobacco: Never  Vaping Use   Vaping Use: Never used  Substance and Sexual Activity   Alcohol use: Not Currently   Drug use: Not on file   Sexual activity: Yes  Other Topics Concern   Not on file  Social History Narrative   Not on file   Social Determinants of Health   Financial Resource Strain: Not on file  Food Insecurity: Not on file  Transportation Needs: Not on file  Physical Activity: Not on file  Stress: Not on file  Social Connections: Not on file  Intimate Partner Violence: Not on file     No Known Allergies   Outpatient Medications Prior to Visit  Medication Sig Dispense Refill   amLODipine (NORVASC) 5 MG tablet TAKE 1 TABLET (5 MG TOTAL) BY MOUTH DAILY. TO LOWER BLOOD PRESSURE 90 tablet 2   chlorthalidone (HYGROTON) 25 MG tablet TAKE 1 TABLET (25 MG TOTAL) BY MOUTH DAILY. 90 tablet 2   clobetasol cream (TEMOVATE) [id number] % Apply 1 application topically 2 (two) times daily. 30 g 0   tamsulosin (FLOMAX) 0.4 MG CAPS capsule TAKE 2 CAPSULES (0.8 MG TOTAL) BY MOUTH DAILY. 60 capsule 3   traMADol (ULTRAM) 50 MG tablet TAKE 1 TABLET (50 MG TOTAL) BY MOUTH EVERY 8 (EIGHT) HOURS AS NEEDED FOR UP TO 5 DAYS. 30 tablet 0   No facility-administered medications prior to visit.     Review of Systems  Constitutional: Negative.  Negative for activity change.  HENT: Negative.    Eyes: Negative.  Negative for pain and discharge.  Respiratory: Negative.    Cardiovascular: Negative.   Gastrointestinal: Negative.   Endocrine: Negative.   Genitourinary:  Negative for difficulty urinating.       Tamsulosin improved urination  Musculoskeletal:  Positive for  arthralgias, joint swelling and myalgias.  Neurological:  Positive for headaches.  Psychiatric/Behavioral: Negative.        Objective:   Physical Exam Constitutional:      Appearance: Normal appearance.  HENT:     Head: Normocephalic.     Nose: Nose normal.     Mouth/Throat:     Mouth: Mucous membranes are moist.  Eyes:     Pupils: Pupils are equal, round, and reactive to light.  Cardiovascular:     Rate and Rhythm: Normal rate and regular rhythm.     Heart sounds: Normal heart sounds.  Pulmonary:     Effort: Pulmonary effort is normal.     Breath sounds: Normal breath sounds.  Abdominal:  General: There is no distension.     Tenderness: There is no abdominal tenderness.  Musculoskeletal:     Cervical back: No rigidity or tenderness.     Right lower leg: No edema.     Left lower leg: No edema.  Skin:    General: Skin is warm and dry.  Neurological:     Mental Status: He is alert and oriented to person, place, and time.  Psychiatric:        Mood and Affect: Mood normal.        Behavior: Behavior normal.    There were no vitals filed for this visit.    MRI Cspine IMPRESSION: 1. 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 2. 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 3. Small central disc protrusions at C3-4 and C4-5 with resultant mild spinal stenosis.    Assessment & Plan:  I personally reviewed all images and lab data in the [facility] system as well as any outside material available during this office visit and agree with the  radiology impressions.   No problem-specific Assessment & Plan notes found for this encounter.  There are no diagnoses linked to this encounter.   No problem-specific Assessment & Plan notes found for this encounter.   There are no diagnoses linked to this  encounter.   25 minutes spent reviewing this patient's chart and interviewing the patient using Spanish interpreter moderate complexity of care plan developed

## 2020-09-01 ENCOUNTER — Ambulatory Visit: Payer: Medicare Other | Admitting: Critical Care Medicine

## 2020-09-02 ENCOUNTER — Telehealth: Payer: Self-pay

## 2020-09-02 NOTE — Telephone Encounter (Signed)
Pt came into the office requesting an appt with Dr. Ernestina Patches. Pt doesn't speak english

## 2020-09-02 NOTE — Telephone Encounter (Signed)
Right C7-T1 IL on 08/04/20. Please advise.

## 2020-09-03 NOTE — Telephone Encounter (Signed)
Scheduled for 9/22 at 1030 with driver.

## 2020-09-04 ENCOUNTER — Ambulatory Visit: Payer: Medicare Other | Admitting: Physician Assistant

## 2020-09-21 NOTE — Progress Notes (Signed)
Established Patient Office Visit  Subjective:  Patient ID: Patrick Ortega, male    DOB: 1940-08-15  Age: 80 y.o. MRN: [id number]  CC:  Chief Complaint  Patient presents with   Follow-up    HPI Patrick Ortega presents for primary care follow-up visit.  Initially the patient was confused as he thought he was here for neck injections with orthopedics.  Patient's visit was assisted with Spanish interpreter Patrick Ortega [phone] Patient has chronic cervical radiculopathy and has had at least 3 steroid injections with orthopedics has another one pending on September 22.  Patient states [facility] was a no benefit to his pain.  Orthopedic states that the injections did not help he may need surgical referral.  I informed the patient of this opinion so he had a full understanding of this going to the appointment upcoming.  Patient did agree to receive the flu vaccine at this visit.  On arrival blood pressure is elevated 168/83 and he states he is compliant with the amlodipine 5 mg a day chlorthalidone 25 mg a day and tamsulosin 0.4 mg a day  Patient states the gabapentin was of not any benefit  Patient does have a PSA of 9.1 and he did not see urology as we recommended previously   Past Medical History:  Diagnosis Date   GERD (gastroesophageal reflux disease)    Hypertension     Past Surgical History:  Procedure Laterality Date   NO PAST SURGERIES      Family History  Problem Relation Age of Onset   Ulcers Mother     Social History   Socioeconomic History   Marital status: Married    Spouse name: Not on file   Number of children: Not on file   Years of education: Not on file   Highest education level: Not on file  Occupational History   Not on file  Tobacco Use   Smoking status: Former    Packs/day: 0.25    Years: 0.50    Pack years: 0.13    Types: Cigarettes   Smokeless tobacco: Never  Vaping Use   Vaping Use: Never used  Substance  and Sexual Activity   Alcohol use: Not Currently   Drug use: Not on file   Sexual activity: Yes  Other Topics Concern   Not on file  Social History Narrative   Not on file   Social Determinants of Health   Financial Resource Strain: Not on file  Food Insecurity: Not on file  Transportation Needs: Not on file  Physical Activity: Not on file  Stress: Not on file  Social Connections: Not on file  Intimate Partner Violence: Not on file    Outpatient Medications Prior to Visit  Medication Sig Dispense Refill   amLODipine (NORVASC) 5 MG tablet TAKE 1 TABLET (5 MG TOTAL) BY MOUTH DAILY. TO LOWER BLOOD PRESSURE 90 tablet 2   chlorthalidone (HYGROTON) 25 MG tablet TAKE 1 TABLET (25 MG TOTAL) BY MOUTH DAILY. 90 tablet 2   clobetasol cream (TEMOVATE) 9.48 % Apply 1 application topically 2 (two) times daily. 30 g 0   tamsulosin (FLOMAX) 0.4 MG CAPS capsule TAKE 2 CAPSULES (0.8 MG TOTAL) BY MOUTH DAILY. 60 capsule 3   traMADol (ULTRAM) 50 MG tablet TAKE 1 TABLET (50 MG TOTAL) BY MOUTH EVERY 8 (EIGHT) HOURS AS NEEDED FOR UP TO 5 DAYS. 30 tablet 0   No facility-administered medications prior to visit.    No Known Allergies  ROS Review of Systems  Constitutional:  Negative for chills, diaphoresis and fever.  HENT:  Negative for congestion, hearing loss, nosebleeds, sore throat and tinnitus.   Eyes:  Negative for photophobia and redness.  Respiratory:  Negative for cough, shortness of breath, wheezing and stridor.   Cardiovascular:  Negative for chest pain, palpitations and leg swelling.  Gastrointestinal:  Negative for abdominal pain, blood in stool, constipation, diarrhea, nausea and vomiting.  Endocrine: Negative for polydipsia.  Genitourinary:  Negative for dysuria, flank pain, frequency, hematuria and urgency.  Musculoskeletal:  Positive for neck pain and neck stiffness. Negative for back pain, gait problem and myalgias.  Skin: Negative.  Negative for rash.  Allergic/Immunologic:  Negative for environmental allergies.  Neurological:  Negative for dizziness, tremors, seizures, weakness and headaches.  Hematological:  Does not bruise/bleed easily.  Psychiatric/Behavioral: Negative.  Negative for suicidal ideas. The patient is not nervous/anxious.      Objective:    Physical Exam Vitals reviewed.  Constitutional:      Appearance: Normal appearance. He is well-developed. He is obese. He is not diaphoretic.  HENT:     Head: Normocephalic and atraumatic.     Ears:     Comments: Extremely hard of hearing    Nose: Nose normal. No nasal deformity, septal deviation, mucosal edema or rhinorrhea.     Right Sinus: No maxillary sinus tenderness or frontal sinus tenderness.     Left Sinus: No maxillary sinus tenderness or frontal sinus tenderness.     Mouth/Throat:     Mouth: Mucous membranes are moist.     Pharynx: Oropharynx is clear. No oropharyngeal exudate.  Eyes:     General: No scleral icterus.    Conjunctiva/sclera: Conjunctivae normal.     Pupils: Pupils are equal, round, and reactive to light.  Neck:     Thyroid: No thyromegaly.     Vascular: No carotid bruit or JVD.     Trachea: Trachea normal. No tracheal tenderness or tracheal deviation.  Cardiovascular:     Rate and Rhythm: Normal rate and regular rhythm.     Chest Wall: PMI is not displaced.     Pulses: Normal pulses. No decreased pulses.     Heart sounds: Normal heart sounds, S1 normal and S2 normal. Heart sounds not distant. No murmur heard. No systolic murmur is present.  No diastolic murmur is present.    No friction rub. No gallop. No S3 or S4 sounds.  Pulmonary:     Effort: Pulmonary effort is normal. No tachypnea, accessory muscle usage or respiratory distress.     Breath sounds: Normal breath sounds. No stridor. No decreased breath sounds, wheezing, rhonchi or rales.  Chest:     Chest wall: No tenderness.  Abdominal:     General: Bowel sounds are normal. There is no distension.      Palpations: Abdomen is soft. Abdomen is not rigid.     Tenderness: There is no abdominal tenderness. There is no guarding or rebound.  Musculoskeletal:        General: Normal range of motion.     Cervical back: Normal range of motion and neck supple. Tenderness present. No edema, erythema or rigidity. No muscular tenderness. Normal range of motion.  Lymphadenopathy:     Head:     Right side of head: No submental or submandibular adenopathy.     Left side of head: No submental or submandibular adenopathy.     Cervical: No cervical adenopathy.  Skin:    General: Skin  is warm and dry.     Coloration: Skin is not pale.     Findings: No rash.     Nails: There is no clubbing.  Neurological:     Mental Status: He is alert and oriented to person, place, and time.     Sensory: No sensory deficit.  Psychiatric:        Mood and Affect: Mood normal.        Speech: Speech normal.        Behavior: Behavior normal.        Thought Content: Thought content normal.        Judgment: Judgment normal.    BP (!) 168/83   Pulse 65   Resp 16   Wt 165 lb 9.6 oz (75.1 kg)   SpO2 97%   BMI 33.45 kg/m  Wt Readings from Last 3 Encounters:  09/22/20 165 lb 9.6 oz (75.1 kg)  07/09/20 166 lb (75.3 kg)  05/20/20 168 lb 12.8 oz (76.6 kg)     Health Maintenance Due  Topic Date Due   COVID-19 Vaccine (4 - Booster for Pfizer series) 04/08/2020    There are no preventive care reminders to display for this patient.  Lab Results  Component Value Date   TSH 2.080 04/25/2019   Lab Results  Component Value Date   WBC 13.1 (H) 07/09/2020   HGB 14.5 07/09/2020   HCT 41.8 07/09/2020   MCV 85 07/09/2020   PLT 416 07/09/2020   Lab Results  Component Value Date   NA 139 07/09/2020   K 3.4 (L) 07/09/2020   CO2 25 10/12/2019   GLUCOSE 89 07/09/2020   BUN 22 07/09/2020   CREATININE 1.01 07/09/2020   BILITOT 0.3 07/09/2020   ALKPHOS 118 07/09/2020   AST 26 07/09/2020   ALT 15 04/25/2019   PROT 8.0  07/09/2020   ALBUMIN 4.7 (H) 07/09/2020   CALCIUM 10.1 07/09/2020   EGFR 75 07/09/2020   Lab Results  Component Value Date   CHOL 195 05/09/2019   Lab Results  Component Value Date   HDL 64 05/09/2019   Lab Results  Component Value Date   LDLCALC 112 (H) 05/09/2019   Lab Results  Component Value Date   TRIG 110 05/09/2019   Lab Results  Component Value Date   CHOLHDL 3.0 05/09/2019   No results found for: HGBA1C    Assessment & Plan:   Problem List Items Addressed This Visit       Cardiovascular and Mediastinum   Essential hypertension    Hypertension not well controlled plan increase amlodipine 10 mg daily continue chlorthalidone at 25 mg daily      Relevant Medications   amLODipine (NORVASC) 10 MG tablet   chlorthalidone (HYGROTON) 25 MG tablet     Nervous and Auditory   Cervical radiculopathy    Patient has follow-up visit with Dr. Ernestina Patches on September 22 for more injections may yet need surgical consult        Other   Elevated PSA    Patient's BPH symptoms improved PSA was elevated urology wanted to watch when they saw him earlier this year  Since the PSA was 9 and he is having back pain I will recheck PSA to make sure it is not elevating further      Relevant Orders   PSA   Other Visit Diagnoses     Need for immunization against influenza    -  Primary   Relevant Orders  Flu Vaccine QUAD 20moIM (Fluarix, Fluzone & Alfiuria Quad PF) (Completed)   Uncontrolled hypertension       Relevant Medications   amLODipine (NORVASC) 10 MG tablet   chlorthalidone (HYGROTON) 25 MG tablet       Meds ordered this encounter  Medications   amLODipine (NORVASC) 10 MG tablet    Sig: Take 1 tablet (10 mg total) by mouth daily.    Dispense:  90 tablet    Refill:  2   chlorthalidone (HYGROTON) 25 MG tablet    Sig: TAKE 1 TABLET (25 MG TOTAL) BY MOUTH DAILY.    Dispense:  90 tablet    Refill:  2   tamsulosin (FLOMAX) 0.4 MG CAPS capsule    Sig: TAKE 2  CAPSULES (0.8 MG TOTAL) BY MOUTH DAILY.    Dispense:  60 capsule    Refill:  3    Follow-up: Return in about 2 months (around 11/22/2020).    PAsencion Noble MD

## 2020-09-22 ENCOUNTER — Encounter: Payer: Self-pay | Admitting: Critical Care Medicine

## 2020-09-22 ENCOUNTER — Other Ambulatory Visit: Payer: Self-pay

## 2020-09-22 ENCOUNTER — Ambulatory Visit: Payer: Medicare Other | Attending: Critical Care Medicine | Admitting: Critical Care Medicine

## 2020-09-22 VITALS — BP 168/83 | HR 65 | Resp 16 | Wt 165.6 lb

## 2020-09-22 DIAGNOSIS — R972 Elevated prostate specific antigen [PSA]: Secondary | ICD-10-CM

## 2020-09-22 DIAGNOSIS — I10 Essential (primary) hypertension: Secondary | ICD-10-CM

## 2020-09-22 DIAGNOSIS — Z87891 Personal history of nicotine dependence: Secondary | ICD-10-CM | POA: Diagnosis not present

## 2020-09-22 DIAGNOSIS — Z7901 Long term (current) use of anticoagulants: Secondary | ICD-10-CM | POA: Diagnosis not present

## 2020-09-22 DIAGNOSIS — Z79899 Other long term (current) drug therapy: Secondary | ICD-10-CM | POA: Insufficient documentation

## 2020-09-22 DIAGNOSIS — Z23 Encounter for immunization: Secondary | ICD-10-CM | POA: Diagnosis not present

## 2020-09-22 DIAGNOSIS — M5412 Radiculopathy, cervical region: Secondary | ICD-10-CM

## 2020-09-22 DIAGNOSIS — Z09 Encounter for follow-up examination after completed treatment for conditions other than malignant neoplasm: Secondary | ICD-10-CM | POA: Diagnosis not present

## 2020-09-22 MED ORDER — CHLORTHALIDONE 25 MG PO TABS
ORAL_TABLET | Freq: Every day | ORAL | 2 refills | Status: DC
  Filled 2020-09-22: qty 90, fill #0
  Filled 2020-10-07 – 2021-01-12 (×2): qty 90, 90d supply, fill #0

## 2020-09-22 MED ORDER — AMLODIPINE BESYLATE 10 MG PO TABS
10.0000 mg | ORAL_TABLET | Freq: Every day | ORAL | 2 refills | Status: DC
Start: 2020-09-22 — End: 2021-03-10
  Filled 2020-09-22: qty 30, 30d supply, fill #0
  Filled 2020-10-07: qty 90, 90d supply, fill #1
  Filled 2021-01-12: qty 90, 90d supply, fill #0

## 2020-09-22 MED ORDER — TAMSULOSIN HCL 0.4 MG PO CAPS
ORAL_CAPSULE | ORAL | 3 refills | Status: DC
  Filled 2020-09-22: qty 60, 30d supply, fill #0
  Filled 2020-11-18: qty 60, 30d supply, fill #1
  Filled 2021-01-12: qty 60, 30d supply, fill #0
  Filled 2021-02-09: qty 60, 30d supply, fill #1

## 2020-09-22 NOTE — Assessment & Plan Note (Addendum)
Patient's BPH symptoms improved PSA was elevated urology wanted to watch when they saw him earlier this year  Since the PSA was 9 and he is having back pain I will recheck PSA to make sure it is not elevating further

## 2020-09-22 NOTE — Assessment & Plan Note (Signed)
Hypertension not well controlled plan increase amlodipine 10 mg daily continue chlorthalidone at 25 mg daily

## 2020-09-22 NOTE — Patient Instructions (Addendum)
Increase amlodipine to 10 mg daily and stay on chlorthalidone 1 pill 25 mg daily refill sent to our pharmacy here  Stay on tamsulosin daily  Refills on all your medications sent to our pharmacy  Flu vaccine was given  Keep your appointment this Thursday with orthopedics with Dr. Ernestina Patches for your neck injection  PSA was drawn  Aumente la amlodipina a 10 mg diarios y contine con la clortalidona 1 pastilla de 25 mg diarios que se envan a nuestra farmacia aqu  Mantngase en tamsulosina diariamente  Resurtidos de todos sus medicamentos enviados a nuestra farmacia  Se administr la vacuna contra la influenza  Se extrajo el PSA  Acude a tu cita este jueves con ortopedia con el Dr. Ernestina Patches para tu inyeccin de cuello  Return Dr Joya Gaskins 2 months

## 2020-09-22 NOTE — Assessment & Plan Note (Signed)
Patient has follow-up visit with Dr. Ernestina Patches on September 22 for more injections may yet need surgical consult

## 2020-09-23 LAB — PSA: Prostate Specific Ag, Serum: 7 ng/mL — ABNORMAL HIGH (ref 0.0–4.0)

## 2020-09-25 ENCOUNTER — Ambulatory Visit (INDEPENDENT_AMBULATORY_CARE_PROVIDER_SITE_OTHER): Payer: Medicare Other | Admitting: Physical Medicine and Rehabilitation

## 2020-09-25 ENCOUNTER — Encounter: Payer: Self-pay | Admitting: Physical Medicine and Rehabilitation

## 2020-09-25 ENCOUNTER — Other Ambulatory Visit: Payer: Self-pay

## 2020-09-25 ENCOUNTER — Ambulatory Visit: Payer: Self-pay

## 2020-09-25 VITALS — BP 129/75 | HR 69

## 2020-09-25 DIAGNOSIS — M47812 Spondylosis without myelopathy or radiculopathy, cervical region: Secondary | ICD-10-CM | POA: Diagnosis not present

## 2020-09-25 MED ORDER — METHYLPREDNISOLONE ACETATE 80 MG/ML IJ SUSP
80.0000 mg | Freq: Once | INTRAMUSCULAR | Status: AC
  Administered 2020-09-25: 80 mg

## 2020-09-25 NOTE — Patient Instructions (Signed)

## 2020-09-25 NOTE — Progress Notes (Signed)
Pt state neck pain that travels to the front right side of his neck. Pt state mention he feels a knot on the anterior of the neck. Pt state when he turn his head and neck he can feel the pain. Pt has hx of inj on 08/04/20 pt state it didn't help.  Numeric Pain Rating Scale and Functional Assessment Average Pain 7   In the last MONTH (on 0-10 scale) has pain interfered with the following?  1. General activity like being  able to carry out your everyday physical activities such as walking, climbing stairs, carrying groceries, or moving a chair?  Rating(9)   +Driver, -BT, -Dye Allergies.

## 2020-10-03 NOTE — Procedures (Signed)
Diagnostic Cervical Facet Joint Nerve Block with Fluoroscopic Guidance  Patient: Patrick Ortega      Date of Birth: [birth year] MRN: [id number] PCP: [name], MD      Visit Date: 09/25/2020   Universal Protocol:    Date/Time: 09/30/224:52 PM  Consent Given By: the patient  Position: PRONE  Additional Comments: Vital signs were monitored before and after the procedure. Patient was prepped and draped in the usual sterile fashion. The correct patient, procedure, and site was verified.   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 The beam was then moved in a small "counter" oblique to the contralateral side with a small amount of caudal tilt to achieve a trajectory alignment with the desired nerves.  For each target described below the skin was anesthetized with 1 ml of 1% Lidocaine without epinephrine.   To block the facet joint nerves from C3 through C7, the lateral masses of these respective levels were localized under fluoroscopic visualization.  A spinal needle was inserted down to the "waist" at the above mentioned cervical levels.  The  needle was then "walked off" until it rested just lateral to the trough of the lateral mass of the medial branch nerve, which innervates the cervical facet joint.   After contact with periosteum and negative aspirate for blood and CSF, correct placement  without intravascular or epidural spread was confirmed by Bi-planar images and  injecting 0.5 ml. of Omnipaque-240.  A spot radiograph was obtained of this image.  Next, a 0.5 ml. volume of 1% Lidocaine without Epinephrine was then injected.  Prior to the procedure, the patient was given a Pain Diary which was completed for baseline measurements.  After the procedure, the patient rated their pain every 30 minutes and will continue rating at this frequency for a total of 5 hours.  The patient has been asked to complete the Diary and return to [place] by mail, fax or hand delivered as soon as possible.   Additional Comments:  The patient tolerated the procedure well Dressing: Band-Aid    Post-procedure details: Patient was observed during the procedure. Post-procedure instructions were reviewed.  Patient left the clinic in stable condition.

## 2020-10-03 NOTE — Progress Notes (Signed)
Trigger Frasier - 80 y.o. male MRN [id number]  Date of birth: October 06, 1940  Office Visit Note: Visit Date: 09/25/2020 PCP: [name], MD Referred by: [name], MD  Subjective: Chief Complaint  Patient presents with   Neck - Pain   Head - Pain   HPI:  Patrick Ortega is a 80 y.o. male who comes in today At the request of Dr. Eunice Blase for right C5-6 and C6-7 facet joint blocks using fluoroscopic guidance.  Patient's had 2 prior cervical epidural injections with the first injection helping to a decent degree but short-lived.  Second injection did not seem to help as much.  He has had significant physical therapy and medication management without really any continued relief.  He clearly has some level of myofascial pain involvement but he is having needling done and manual treatment with therapy here in our office.  At this point depending on relief with facet joint blocks we will have him potentially follow-up for consultation with Dr. Basil Dess.  ROS Otherwise per HPI.  Assessment & Plan: Visit Diagnoses:    ICD-10-CM   1. Cervical spondylosis without myelopathy  M47.812 XR C-ARM NO REPORT    Facet Injection    methylPREDNISolone acetate (DEPO-MEDROL) injection 80 mg      Plan: No additional findings.   Meds & Orders:  Meds ordered this encounter  Medications   methylPREDNISolone acetate (DEPO-MEDROL) injection 80 mg    Orders Placed This Encounter  Procedures   Facet Injection   XR C-ARM NO REPORT    Follow-up: Return if symptoms worsen or fail to improve.   Procedures: No procedures performed  Diagnostic Cervical Facet Joint Nerve Block with Fluoroscopic Guidance  Patient: Patrick Ortega      Date of Birth: December 30, 1940 MRN: [id number] PCP: [name], MD      Visit Date: 09/25/2020   Universal Protocol:    Date/Time: 09/30/224:52 PM  Consent Given By: the patient  Position: PRONE  Additional Comments: Vital signs were monitored before  and after the procedure. Patient was prepped and draped in the usual sterile fashion. The correct patient, procedure, and site was verified.   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 The beam was then moved in a small "counter" oblique to the contralateral side with a small amount of caudal tilt to achieve a trajectory alignment with the desired nerves.  For each target described below the skin was anesthetized with 1 ml of 1% Lidocaine without epinephrine.   To block the facet joint nerves from C3 through C7, the lateral masses of these respective levels were localized under fluoroscopic visualization.  A spinal needle was inserted down to the "waist" at the above mentioned cervical levels.  The  needle was then "walked off" until it rested just lateral to the trough of the lateral mass of the medial branch nerve, which innervates the cervical facet joint.   After contact with periosteum and negative  aspirate for blood and CSF, correct placement without intravascular or epidural spread was confirmed by Bi-planar images and  injecting 0.5 ml. of Omnipaque-240.  A spot radiograph was obtained of this image.  Next, a 0.5 ml. volume of 1% Lidocaine without Epinephrine was then injected.  Prior to the procedure, the patient was given a Pain Diary which was completed for baseline measurements.  After the  procedure, the patient rated their pain every 30 minutes and will continue rating at this frequency for a total of 5 hours.  The patient has been asked to complete the Diary and return to [place] by mail, fax or hand delivered as soon as possible.   Additional Comments:  The patient tolerated the procedure well Dressing: Band-Aid    Post-procedure details: Patient was observed during the procedure. Post-procedure instructions were reviewed.  Patient left the clinic in stable condition.       Clinical History: MRI CERVICAL SPINE WITHOUT CONTRAST   TECHNIQUE: Multiplanar, multisequence MR imaging of the cervical spine was performed. No intravenous contrast was administered.   COMPARISON:  Prior radiograph from 12/14/2019.   FINDINGS: Alignment: Straightening with smooth reversal of the normal cervical lordosis. Trace anterolisthesis of C7 on T1.   Vertebrae: Vertebral body height maintained without acute or chronic fracture. Bone marrow signal intensity mildly heterogeneous but within normal limits. No worrisome osseous lesions. Mild reactive endplate change present about the C5-6 interspace. No other abnormal marrow edema.   Cord: Normal signal and morphology.   Posterior Fossa, vertebral arteries, paraspinal tissues: Visualized brain and posterior fossa within normal limits. Craniocervical junction normal. Paraspinous and prevertebral soft tissues within normal limits. Normal flow voids seen within the vertebral arteries bilaterally.   Disc levels:   C2-C3: Small central disc protrusion mildly indents the ventral thecal sac. No significant spinal stenosis or cord deformity. Foramina remain patent.   C3-C4: Shallow central disc protrusion indents the ventral thecal sac (series 6, image 8). Mild spinal stenosis with mild flattening of the ventral cord. Superimposed mild uncovertebral hypertrophy without significant foraminal encroachment.   C4-C5: Small central disc  protrusion indents the ventral thecal sac (series 6, image 12). Mild spinal stenosis with mild flattening of the ventral cord, slightly greater on the right. Superimposed bilateral uncovertebral hypertrophy without significant foraminal narrowing.   C5-C6: Degenerative intervertebral disc space narrowing with diffuse disc osteophyte complex, eccentric to the right. Broad posterior component flattens and partially faces the ventral thecal sac. Secondary flattening of the ventral cord, greater on the right. No cord signal changes. Mild to moderate spinal stenosis. Severe right C6 foraminal narrowing. Left neural foramen remains patent. Small perineural cyst noted at the left neural foramen.   C6-C7: Degenerative intervertebral disc space narrowing with diffuse disc osteophyte complex. Flattening and partial effacement of the ventral thecal sac with resultant mild spinal stenosis. Superimposed mild facet and ligament flavum hypertrophy. Moderate left worse than right C7 foraminal stenosis.   C7-T1: Trace anterolisthesis. No significant disc bulge. Right-sided facet degeneration. No canal or foraminal stenosis.   Visualized upper thoracic spine demonstrates no significant finding.   IMPRESSION: 1. 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 2. 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 3. Small central disc protrusions at C3-4 and C4-5 with resultant mild spinal stenosis.     Electronically Signed   By: Pincus Badder.D.  On: 03/24/2020 00:42     Objective:  VS:  HT:    WT:   BMI:     BP:129/75  HR:69bpm  TEMP: ( )  RESP:  Physical Exam Vitals and nursing note reviewed.  Constitutional:      General: He is not in acute distress.    Appearance: Normal appearance. He is not ill-appearing.   HENT:     Head: Normocephalic and atraumatic.     Right Ear: External ear normal.     Left Ear: External ear normal.  Eyes:     Extraocular Movements: Extraocular movements intact.  Cardiovascular:     Rate and Rhythm: Normal rate.     Pulses: Normal pulses.  Abdominal:     General: There is no distension.     Palpations: Abdomen is soft.  Musculoskeletal:        General: No signs of injury.     Cervical back: Neck supple. Tenderness present. No rigidity.     Right lower leg: No edema.     Left lower leg: No edema.     Comments: Patient has good strength in the upper extremities with 5 out of 5 strength in wrist extension long finger flexion APB.  No intrinsic hand muscle atrophy.  Negative Hoffmann's test.  He does have painful range of motion limited to the right more than left consistent with facet mediated pain.  He also has trigger points in the levator scapula trapezius and cervical paraspinal.  Lymphadenopathy:     Cervical: No cervical adenopathy.  Skin:    Findings: No erythema or rash.  Neurological:     General: No focal deficit present.     Mental Status: He is alert and oriented to person, place, and time.     Sensory: No sensory deficit.     Motor: No weakness or abnormal muscle tone.     Coordination: Coordination normal.  Psychiatric:        Mood and Affect: Mood normal.        Behavior: Behavior normal.     Imaging: No results found.

## 2020-10-07 ENCOUNTER — Other Ambulatory Visit: Payer: Self-pay

## 2020-10-14 ENCOUNTER — Encounter: Payer: Self-pay | Admitting: Physician Assistant

## 2020-10-14 ENCOUNTER — Other Ambulatory Visit: Payer: Self-pay

## 2020-10-14 ENCOUNTER — Ambulatory Visit: Payer: Medicare Other | Attending: Physician Assistant | Admitting: Physician Assistant

## 2020-10-14 VITALS — BP 132/72 | HR 76 | Temp 98.7°F | Resp 18 | Ht 60.0 in | Wt 160.0 lb

## 2020-10-14 DIAGNOSIS — M5412 Radiculopathy, cervical region: Secondary | ICD-10-CM | POA: Diagnosis not present

## 2020-10-14 NOTE — Progress Notes (Signed)
Patient reports pain from cervical radiculopathy. Patient states tramadol from July [facility] visit did not help, 3 injections from ortho did not help. Ortho recommends surgery at this stage. Patient has eaten today and taken medication at 8am.

## 2020-10-14 NOTE — Progress Notes (Signed)
Established Patient Office Visit  Subjective:  Patient ID: Patrick Ortega, male    DOB: [birth year]  Age: 80 y.o. MRN: [id number]  CC:  Chief Complaint  Patient presents with   Pain    Cervical Radiculopathy    HPI Patrick Ortega reports that he was seen by ortho for chronic neck and back pain on 09/25/20 and was given an injection without relief.  Note from that visit:  HPI:  Patrick Ortega is a 80 y.o. male who comes in today At the request of Dr. Eunice Blase for right C5-6 and C6-7 facet joint blocks using fluoroscopic guidance.  Patient's had 2 prior cervical epidural injections with the first injection helping to a decent degree but short-lived.  Second injection did not seem to help as much.  He has had significant physical therapy and medication management without really any continued relief.  He clearly has some level of myofascial pain involvement but he is having needling done and manual treatment with therapy here in our office.  At this point depending on relief with facet joint blocks we will have him potentially follow-up for consultation with Dr. Basil Dess.  States today the he was previously prescribed tramadol and it did not offer relief. Has been using ice packs with a little relief.  Denies numbness or tingling  Due to language barrier, an interpreter was present during the history-taking and subsequent discussion (and for part of the physical exam) with this patient.      Past Medical History:  Diagnosis Date   GERD (gastroesophageal reflux disease)    Hypertension     Past Surgical History:  Procedure Laterality Date   NO PAST SURGERIES      Family History  Problem Relation Age of Onset   Ulcers Mother     Social History   Socioeconomic History   Marital status: Married    Spouse name: Not on file   Number of children: Not on file   Years of education: Not on file   Highest education level: Not on file  Occupational History   Not on file   Tobacco Use   Smoking status: Former    Packs/day: 0.25    Years: 0.50    Pack years: 0.13    Types: Cigarettes   Smokeless tobacco: Never  Vaping Use   Vaping Use: Never used  Substance and Sexual Activity   Alcohol use: Not Currently   Drug use: Not on file   Sexual activity: Yes  Other Topics Concern   Not on file  Social History Narrative   Not on file   Social Determinants of Health   Financial Resource Strain: Not on file  Food Insecurity: Not on file  Transportation Needs: Not on file  Physical Activity: Not on file  Stress: Not on file  Social Connections: Not on file  Intimate Partner Violence: Not on file    Outpatient Medications Prior to Visit  Medication Sig Dispense Refill   amLODipine (NORVASC) 10 MG tablet Take 1 tablet (10 mg total) by mouth daily. 90 tablet 2   chlorthalidone (HYGROTON) 25 MG tablet TAKE 1 TABLET (25 MG TOTAL) BY MOUTH DAILY. 90 tablet 2   tamsulosin (FLOMAX) 0.4 MG CAPS capsule TAKE 2 CAPSULES (0.8 MG TOTAL) BY MOUTH DAILY. 60 capsule 3   No facility-administered medications prior to visit.    No Known Allergies  ROS Review of Systems  Constitutional: Negative.   HENT: Negative.    Eyes: Negative.  Respiratory:  Negative for shortness of breath.   Cardiovascular:  Negative for chest pain.  Gastrointestinal: Negative.   Endocrine: Negative.   Genitourinary: Negative.   Musculoskeletal:  Positive for back pain and neck pain.  Skin: Negative.   Allergic/Immunologic: Negative.   Neurological: Negative.   Hematological: Negative.   Psychiatric/Behavioral: Negative.       Objective:    Physical Exam Vitals and nursing note reviewed.  Constitutional:      Appearance: Normal appearance.  HENT:     Head: Normocephalic and atraumatic.     Right Ear: External ear normal.     Left Ear: External ear normal.     Nose: Nose normal.     Mouth/Throat:     Mouth: Mucous membranes are moist.     Pharynx: Oropharynx is clear.   Eyes:     Extraocular Movements: Extraocular movements intact.     Conjunctiva/sclera: Conjunctivae normal.     Pupils: Pupils are equal, round, and reactive to light.  Cardiovascular:     Rate and Rhythm: Normal rate and regular rhythm.     Pulses: Normal pulses.     Heart sounds: Normal heart sounds.  Pulmonary:     Effort: Pulmonary effort is normal.     Breath sounds: Normal breath sounds.  Musculoskeletal:     Cervical back: Neck supple. No swelling or tenderness. Decreased range of motion.     Thoracic back: No swelling or bony tenderness. Decreased range of motion.     Lumbar back: No swelling or bony tenderness. Decreased range of motion.  Skin:    General: Skin is warm and dry.  Neurological:     General: No focal deficit present.     Mental Status: He is alert and oriented to person, place, and time.  Psychiatric:        Mood and Affect: Mood normal.        Behavior: Behavior normal.        Thought Content: Thought content normal.        Judgment: Judgment normal.    BP 132/72 (BP Location: Left Arm, Patient Position: Sitting, Cuff Size: Normal)   Pulse 76   Temp 98.7 F (37.1 C) (Oral)   Resp 18   Ht 5' (1.524 m)   Wt 160 lb (72.6 kg)   SpO2 98%   BMI 31.25 kg/m  Wt Readings from Last 3 Encounters:  10/14/20 160 lb (72.6 kg)  09/22/20 165 lb 9.6 oz (75.1 kg)  07/09/20 166 lb (75.3 kg)     Health Maintenance Due  Topic Date Due   COVID-19 Vaccine (4 - Booster for Pfizer series) 04/08/2020    There are no preventive care reminders to display for this patient.  Lab Results  Component Value Date   TSH 2.080 04/25/2019   Lab Results  Component Value Date   WBC 13.1 (H) 07/09/2020   HGB 14.5 07/09/2020   HCT 41.8 07/09/2020   MCV 85 07/09/2020   PLT 416 07/09/2020   Lab Results  Component Value Date   NA 139 07/09/2020   K 3.4 (L) 07/09/2020   CO2 25 10/12/2019   GLUCOSE 89 07/09/2020   BUN 22 07/09/2020   CREATININE 1.01 07/09/2020    BILITOT 0.3 07/09/2020   ALKPHOS 118 07/09/2020   AST 26 07/09/2020   ALT 15 04/25/2019   PROT 8.0 07/09/2020   ALBUMIN 4.7 (H) 07/09/2020   CALCIUM 10.1 07/09/2020   EGFR 75 07/09/2020   Lab  Results  Component Value Date   CHOL 195 05/09/2019   Lab Results  Component Value Date   HDL 64 05/09/2019   Lab Results  Component Value Date   LDLCALC 112 (H) 05/09/2019   Lab Results  Component Value Date   TRIG 110 05/09/2019   Lab Results  Component Value Date   CHOLHDL 3.0 05/09/2019   No results found for: HGBA1C    Assessment & Plan:   Problem List Items Addressed This Visit       Nervous and Auditory   Cervical radiculopathy - Primary   Relevant Orders   Ambulatory referral to Orthopedic Surgery    No orders of the defined types were placed in this encounter.  1. Cervical radiculopathy Patient education given on supportive care.  Referral to Dr. Basil Dess for further evaluation.  Red flags given for prompt reevaluation - Ambulatory referral to Orthopedic Surgery   I have reviewed the patient's medical history (PMH, PSH, Social History, Family History, Medications, and allergies) , and have been updated if relevant. I spent 20 minutes reviewing chart and  face to face time with patient.    Follow-up: Return if symptoms worsen or fail to improve.    Loraine Grip Mayers, PA-C

## 2020-10-14 NOTE — Patient Instructions (Signed)
Dolor de espalda crnico Chronic Back Pain Cuando el dolor en la espalda dura ms de 3 meses, se denomina dolor de espalda crnico. Es posible que se desconozca la causa de esta afeccin. Algunas causas frecuentes son las siguientes: Holiday representative (enfermedad degenerativa) de los huesos, los ligamentos o los discos de la espalda. Inflamacin y rigidez en la espalda (artritis). Las personas que sufren dolor de espalda crnico generalmente atraviesan determinados perodos en los que este es ms intenso (episodios de exacerbacin del Social research officer, government). Muchas personas pueden aprender a Financial controller de espalda con el cuidado en Engineer, mining. Siga estas instrucciones en su casa: Est atento a cualquier cambio en los sntomas. Tome estas medidas para Theatre stage manager dolor: Control del dolor y de la rigidez   Si se lo indican, aplique hielo sobre la zona dolorida. El mdico puede recomendarle que se aplique hielo durante las primeras 24 a 77 horas despus del comienzo de un episodio de exacerbacin del Social research officer, government. Para hacer esto: Ponga el hielo en una bolsa plstica. Coloque una toalla entre la piel y Therapist, nutritional. Coloque el hielo durante 20 minutos, 2 a 3 veces al da. Si se lo indican, aplique calor en la zona afectada con la frecuencia que le haya indicado el mdico. Use la fuente de calor que el mdico le recomiende, como una compresa de calor hmedo o una almohadilla trmica. Coloque una toalla entre la piel y la fuente de Freight forwarder. Aplique calor durante 20 a 30 minutos. Retire la fuente de calor si la piel se pone de color rojo brillante. Esto es especialmente importante si no puede sentir dolor, calor o fro. Puede correr un riesgo mayor de sufrir quemaduras. Intente tomar un bao de inmersin con agua caliente. Actividad  Evite agacharse y Optometrist otras actividades que agraven el problema. Mantenga una postura correcta mientras est de pie o sentado: Cuando est de pie, mantenga la parte alta de la espalda y el cuello  rectos, con los hombros [place]. Evite encorvarse. Cuando est sentado, mantenga la espalda recta y relaje los hombros. No curve los hombros ni los eche [place]. No permanezca sentado o de pie en el mismo lugar durante mucho tiempo. Durante el da, descanse durante lapsos breves. Esto le Best boy. Descansar recostado o de pie suele ser mejor que hacerlo sentado. Cuando descanse durante perodos ms largos, incorpore alguna [place] o ejercicios de elongacin entre uno y [place]. Esto ayudar a Mining engineer rigidez y Conservation officer, historic buildings. Haga ejercicio con regularidad. Pregntele al mdico qu actividades son seguras para usted. No levante ningn objeto que pese ms de 10 libras (4.5 kg) o que supere el lmite de peso que le hayan indicado, Nurse, children's que el mdico le diga que puede [place]. Siempre use las tcnicas correctas para levantar objetos, entre ellas: Flexionar las rodillas. Mantener la carga cerca del cuerpo. No torcerse. Duerma sobre un colchn firme en una posicin cmoda. Intente acostarse de costado, con las rodillas ligeramente flexionadas. Si se recuesta [employer], coloque una almohada debajo de las rodillas. Medicamentos El tratamiento puede incluir medicamentos para Conservation officer, historic buildings y la inflamacin que se toman por boca o que se aplican sobre la piel, analgsicos recetados o relajantes musculares. Use los medicamentos de venta libre y los recetados solamente como se lo haya indicado el mdico. Pregntele al mdico si el medicamento recetado: Hace necesario que evite conducir o usar [place]. Puede causarle estreimiento. Es posible que tenga que tomar estas medidas para prevenir o tratar el estreimiento:  Beber suficiente lquido como para Theatre manager la orina de color amarillo plido. Usar medicamentos recetados o de Radio broadcast assistant. Consumir alimentos ricos en fibra, como frijoles, cereales integrales, y frutas y verduras frescas. Limitar el consumo de alimentos ricos en grasa y  azcares procesados, como los alimentos fritos o dulces. Instrucciones generales No consuma ningn producto que contenga nicotina o tabaco, como cigarrillos, cigarrillos electrnicos y tabaco de Higher education careers adviser. Si necesita ayuda para dejar de consumir estos productos, consulte al mdico. Concurra a todas las visitas de seguimiento como se lo haya indicado el mdico. Esto es importante. Comunquese con un mdico si: Siente un dolor que no se alivia con reposo o medicamentos. Su dolor empeora o tiene un dolor nuevo. Tiene fiebre alta. Pierde de peso con rapidez. Tiene dificultad para [employer] cotidianas. Solicite ayuda de inmediato si: Siente debilidad o adormecimiento en una o ambas piernas, o en uno o ambos pies. Tiene dificultad para controlar la miccin o la defecacin. Siente un dolor intenso en la espalda y tiene alguno de los siguientes sntomas: Nuseas o vmitos. Dolor en el abdomen. Le falta el aire o se desmaya. Resumen El dolor de espalda crnico es aquel que dura ms de 3 meses. Cuando comienza un episodio de exacerbacin del dolor, aplique hielo en la zona dolorida durante las primeras 24 a 48 horas. Aplquese una compresa de calor hmedo o una almohadilla trmica como se lo haya indicado el mdico. Cuando descanse durante perodos ms largos, incorpore alguna [place] o ejercicios de elongacin entre uno y [place]. Esto ayudar a Mining engineer rigidez y Conservation officer, historic buildings. Esta informacin no tiene Marine scientist el consejo del mdico. Asegrese de hacerle al mdico cualquier pregunta que tenga. Document Revised: 04/17/2019 Document Reviewed: 04/17/2019 Elsevier Patient Education  [address].

## 2020-10-24 DIAGNOSIS — R972 Elevated prostate specific antigen [PSA]: Secondary | ICD-10-CM | POA: Diagnosis not present

## 2020-10-31 DIAGNOSIS — R972 Elevated prostate specific antigen [PSA]: Secondary | ICD-10-CM | POA: Diagnosis not present

## 2020-11-12 ENCOUNTER — Telehealth: Payer: Self-pay | Admitting: Critical Care Medicine

## 2020-11-12 NOTE — Telephone Encounter (Signed)
Copied from [place] [id number]. Topic: General - Other >> Oct 31, 2020  3:03 PM Tessa Lerner A wrote: Reason for CRM: The patient would like to receive orders to have xrays of their abdominal area and possibly additional imaging   Please contact to further discuss  The patient has no symptoms or concerns but would like the imaging procedures

## 2020-11-13 NOTE — Telephone Encounter (Signed)
Has appt with me 11/14  will discuss at that visit

## 2020-11-13 NOTE — Telephone Encounter (Signed)
Called pt and left vm.  Interpreter:Jabier [phone]

## 2020-11-16 NOTE — Progress Notes (Signed)
Established Patient Office Visit  Subjective:  Patient ID: Patrick Ortega, male    DOB: 07-02-1940  Age: 80 y.o. MRN: [id number]  CC:  Chief Complaint  Patient presents with   Foot Swelling    HPI Patrick Ortega presents for primary care follow-up and complaints of numbness and swelling in both feet.  This visit was accomplished with Spanish interpreter hose a [id number].  Patient is also accompanied by his son.  He does not speak English either  Patient has a history of cervical myelopathy and has been having pain in the neck and pain in the upper arms.  He has most recently for the past several weeks developed numbness in both feet.  He had been on gabapentin earlier in the year and this is helping some of his neuropathic pain however he stopped this medication in March and now the symptoms have worsened.  He has received multiple injections to the cervical spine per physical medicine and rehab Dr. Ernestina Patches without any real relief of the numbness however the pain in the neck is somewhat better.  He has an upcoming visit with Dr. Louanne Skye of orthopedic surgery this week.  Patient typically takes the amlodipine and chlorthalidone for blood pressure on arrival blood pressure was 144/65. Patient agreed to receive the pneumococcal vaccine but we were out today he will come back for this Past Medical History:  Diagnosis Date   GERD (gastroesophageal reflux disease)    Hypertension     Past Surgical History:  Procedure Laterality Date   NO PAST SURGERIES      Family History  Problem Relation Age of Onset   Ulcers Mother     Social History   Socioeconomic History   Marital status: Married    Spouse name: Not on file   Number of children: Not on file   Years of education: Not on file   Highest education level: Not on file  Occupational History   Not on file  Tobacco Use   Smoking status: Former    Packs/day: 0.25    Years: 0.50    Pack years: 0.13    Types: Cigarettes    Smokeless tobacco: Never  Vaping Use   Vaping Use: Never used  Substance and Sexual Activity   Alcohol use: Not Currently   Drug use: Not on file   Sexual activity: Yes  Other Topics Concern   Not on file  Social History Narrative   Not on file   Social Determinants of Health   Financial Resource Strain: Not on file  Food Insecurity: Not on file  Transportation Needs: Not on file  Physical Activity: Not on file  Stress: Not on file  Social Connections: Not on file  Intimate Partner Violence: Not on file    Outpatient Medications Prior to Visit  Medication Sig Dispense Refill   amLODipine (NORVASC) 10 MG tablet Take 1 tablet (10 mg total) by mouth daily. 90 tablet 2   chlorthalidone (HYGROTON) 25 MG tablet TAKE 1 TABLET (25 MG TOTAL) BY MOUTH DAILY. 90 tablet 2   tamsulosin (FLOMAX) 0.4 MG CAPS capsule TAKE 2 CAPSULES (0.8 MG TOTAL) BY MOUTH DAILY. 60 capsule 3   No facility-administered medications prior to visit.    No Known Allergies  ROS Review of Systems  Constitutional:  Negative for chills, diaphoresis and fever.  HENT:  Negative for congestion, hearing loss, nosebleeds, sore throat and tinnitus.   Eyes:  Negative for photophobia and redness.  Respiratory:  Negative for cough,  shortness of breath, wheezing and stridor.   Cardiovascular:  Negative for chest pain, palpitations and leg swelling.  Gastrointestinal:  Negative for abdominal pain, blood in stool, constipation, diarrhea, nausea and vomiting.  Endocrine: Negative for polydipsia.  Genitourinary:  Negative for dysuria, flank pain, frequency, hematuria and urgency.  Musculoskeletal:  Positive for neck pain. Negative for back pain and myalgias.  Skin:  Negative for rash.  Allergic/Immunologic: Negative for environmental allergies.  Neurological:  Positive for numbness. Negative for dizziness, tremors, seizures, weakness and headaches.  Hematological:  Does not bruise/bleed easily.  Psychiatric/Behavioral:   Negative for suicidal ideas. The patient is not nervous/anxious.      Objective:    Physical Exam Vitals reviewed.  Constitutional:      Appearance: Normal appearance. He is well-developed. He is not diaphoretic.  HENT:     Head: Normocephalic and atraumatic.     Nose: No nasal deformity, septal deviation, mucosal edema or rhinorrhea.     Right Sinus: No maxillary sinus tenderness or frontal sinus tenderness.     Left Sinus: No maxillary sinus tenderness or frontal sinus tenderness.     Mouth/Throat:     Pharynx: No oropharyngeal exudate.  Eyes:     General: No scleral icterus.    Conjunctiva/sclera: Conjunctivae normal.     Pupils: Pupils are equal, round, and reactive to light.  Neck:     Thyroid: No thyromegaly.     Vascular: No carotid bruit or JVD.     Trachea: Trachea normal. No tracheal tenderness or tracheal deviation.  Cardiovascular:     Rate and Rhythm: Normal rate and regular rhythm.     Chest Wall: PMI is not displaced.     Pulses: Normal pulses. No decreased pulses.     Heart sounds: Normal heart sounds, S1 normal and S2 normal. Heart sounds not distant. No murmur heard. No systolic murmur is present.  No diastolic murmur is present.    No friction rub. No gallop. No S3 or S4 sounds.  Pulmonary:     Effort: No tachypnea, accessory muscle usage or respiratory distress.     Breath sounds: No stridor. No decreased breath sounds, wheezing, rhonchi or rales.  Chest:     Chest wall: No tenderness.  Abdominal:     General: Bowel sounds are normal. There is no distension.     Palpations: Abdomen is soft. Abdomen is not rigid.     Tenderness: There is no abdominal tenderness. There is no guarding or rebound.  Musculoskeletal:        General: Normal range of motion.     Cervical back: Normal range of motion and neck supple. No edema, erythema or rigidity. No muscular tenderness. Normal range of motion.  Lymphadenopathy:     Head:     Right side of head: No submental  or submandibular adenopathy.     Left side of head: No submental or submandibular adenopathy.     Cervical: No cervical adenopathy.  Skin:    General: Skin is warm and dry.     Coloration: Skin is not pale.     Findings: No rash.     Nails: There is no clubbing.  Neurological:     General: No focal deficit present.     Mental Status: He is alert and oriented to person, place, and time. Mental status is at baseline.     Cranial Nerves: No cranial nerve deficit.     Sensory: Sensory deficit present.     Motor:  No weakness.     Coordination: Coordination normal.     Gait: Gait normal.     Comments: Slight decrease in sensation at both feet anterior toes otherwise normal strength and sensation   Psychiatric:        Speech: Speech normal.        Behavior: Behavior normal.    BP (!) 144/65   Pulse 85   Resp 16   Wt 163 lb 6.4 oz (74.1 kg)   SpO2 97%   BMI 31.91 kg/m  Wt Readings from Last 3 Encounters:  11/17/20 163 lb 6.4 oz (74.1 kg)  10/14/20 160 lb (72.6 kg)  09/22/20 165 lb 9.6 oz (75.1 kg)     Health Maintenance Due  Topic Date Due   COVID-19 Vaccine (4 - Booster for Pfizer series) 03/11/2020   Pneumonia Vaccine 45+ Years old (2 - PPSV23 if available, else PCV20) 12/09/2020    There are no preventive care reminders to display for this patient.  Lab Results  Component Value Date   TSH 2.080 04/25/2019   Lab Results  Component Value Date   WBC 13.1 (H) 07/09/2020   HGB 14.5 07/09/2020   HCT 41.8 07/09/2020   MCV 85 07/09/2020   PLT 416 07/09/2020   Lab Results  Component Value Date   NA 139 07/09/2020   K 3.4 (L) 07/09/2020   CO2 25 10/12/2019   GLUCOSE 89 07/09/2020   BUN 22 07/09/2020   CREATININE 1.01 07/09/2020   BILITOT 0.3 07/09/2020   ALKPHOS 118 07/09/2020   AST 26 07/09/2020   ALT 15 04/25/2019   PROT 8.0 07/09/2020   ALBUMIN 4.7 (H) 07/09/2020   CALCIUM 10.1 07/09/2020   EGFR 75 07/09/2020   Lab Results  Component Value Date   CHOL  195 05/09/2019   Lab Results  Component Value Date   HDL 64 05/09/2019   Lab Results  Component Value Date   LDLCALC 112 (H) 05/09/2019   Lab Results  Component Value Date   TRIG 110 05/09/2019   Lab Results  Component Value Date   CHOLHDL 3.0 05/09/2019   No results found for: HGBA1C    Assessment & Plan:   Problem List Items Addressed This Visit       Cardiovascular and Mediastinum   Essential hypertension    Slight elevation in systolic pressure will monitor for now as diastolic pressure 65 continue current medications for blood pressure        Nervous and Auditory   Cervical radiculopathy    Cervical radiculopathy with right shoulder pain neck pain and now bilateral foot numbness  Plan for the patient to begin gabapentin 300 mg 3 times daily and keep upcoming appointment with orthopedic spine       Relevant Medications   gabapentin (NEURONTIN) 300 MG capsule    Meds ordered this encounter  Medications   gabapentin (NEURONTIN) 300 MG capsule    Sig: Take 1 capsule (300 mg total) by mouth 3 (three) times daily. For numbness in hands    Dispense:  90 capsule    Refill:  3    Follow-up: Return in about 4 months (around 03/17/2021).    Asencion Noble, MD

## 2020-11-17 ENCOUNTER — Encounter: Payer: Self-pay | Admitting: Critical Care Medicine

## 2020-11-17 ENCOUNTER — Ambulatory Visit: Payer: Medicare Other | Attending: Critical Care Medicine | Admitting: Critical Care Medicine

## 2020-11-17 ENCOUNTER — Other Ambulatory Visit: Payer: Self-pay

## 2020-11-17 VITALS — BP 144/65 | HR 85 | Resp 16 | Wt 163.4 lb

## 2020-11-17 DIAGNOSIS — M5412 Radiculopathy, cervical region: Secondary | ICD-10-CM

## 2020-11-17 DIAGNOSIS — I10 Essential (primary) hypertension: Secondary | ICD-10-CM

## 2020-11-17 MED ORDER — GABAPENTIN 300 MG PO CAPS
300.0000 mg | ORAL_CAPSULE | Freq: Three times a day (TID) | ORAL | 3 refills | Status: DC
  Filled 2020-11-17: qty 90, 30d supply, fill #0

## 2020-11-17 NOTE — Patient Instructions (Addendum)
Keep your appointment with Dr. Louanne Skye this Thursday  Begin gabapentin 300 mg 3 times daily for the numbness in your feet  No other change in medications  A pneumonia vaccine was given  Return to see Dr. Joya Gaskins 4 months    Acude a tu cita con la Dra. Nitka este jueves  Comience con 300 mg de gabapentina 3 veces al da para el entumecimiento de los pies  Ningn otro cambio en los medicamentos  Se administr una vacuna contra la neumona.  Volver a ver al Dr. Joya Gaskins 4 meses

## 2020-11-17 NOTE — Assessment & Plan Note (Signed)
Slight elevation in systolic pressure will monitor for now as diastolic pressure 65 continue current medications for blood pressure

## 2020-11-17 NOTE — Assessment & Plan Note (Signed)
Cervical radiculopathy with right shoulder pain neck pain and now bilateral foot numbness  Plan for the patient to begin gabapentin 300 mg 3 times daily and keep upcoming appointment with orthopedic spine

## 2020-11-18 ENCOUNTER — Ambulatory Visit: Payer: Medicare Other | Attending: Critical Care Medicine

## 2020-11-18 ENCOUNTER — Other Ambulatory Visit: Payer: Self-pay

## 2020-11-18 DIAGNOSIS — Z23 Encounter for immunization: Secondary | ICD-10-CM | POA: Diagnosis not present

## 2020-11-20 ENCOUNTER — Ambulatory Visit (INDEPENDENT_AMBULATORY_CARE_PROVIDER_SITE_OTHER): Payer: Medicare Other | Admitting: Specialist

## 2020-11-20 ENCOUNTER — Ambulatory Visit (INDEPENDENT_AMBULATORY_CARE_PROVIDER_SITE_OTHER): Payer: Medicare Other

## 2020-11-20 ENCOUNTER — Encounter: Payer: Self-pay | Admitting: Specialist

## 2020-11-20 ENCOUNTER — Other Ambulatory Visit: Payer: Self-pay

## 2020-11-20 VITALS — BP 147/67 | HR 76 | Ht 60.0 in | Wt 163.4 lb

## 2020-11-20 DIAGNOSIS — R29898 Other symptoms and signs involving the musculoskeletal system: Secondary | ICD-10-CM

## 2020-11-20 DIAGNOSIS — M542 Cervicalgia: Secondary | ICD-10-CM | POA: Diagnosis not present

## 2020-11-20 DIAGNOSIS — M25512 Pain in left shoulder: Secondary | ICD-10-CM

## 2020-11-20 DIAGNOSIS — M5010 Cervical disc disorder with radiculopathy, unspecified cervical region: Secondary | ICD-10-CM

## 2020-11-20 DIAGNOSIS — M5030 Other cervical disc degeneration, unspecified cervical region: Secondary | ICD-10-CM | POA: Diagnosis not present

## 2020-11-20 DIAGNOSIS — M12811 Other specific arthropathies, not elsewhere classified, right shoulder: Secondary | ICD-10-CM | POA: Diagnosis not present

## 2020-11-20 DIAGNOSIS — M4802 Spinal stenosis, cervical region: Secondary | ICD-10-CM | POA: Diagnosis not present

## 2020-11-20 DIAGNOSIS — M12812 Other specific arthropathies, not elsewhere classified, left shoulder: Secondary | ICD-10-CM | POA: Diagnosis not present

## 2020-11-20 DIAGNOSIS — M25511 Pain in right shoulder: Secondary | ICD-10-CM | POA: Diagnosis not present

## 2020-11-20 DIAGNOSIS — G8929 Other chronic pain: Secondary | ICD-10-CM

## 2020-11-20 MED ORDER — DICLOFENAC SODIUM 50 MG PO TBEC
50.0000 mg | DELAYED_RELEASE_TABLET | Freq: Every day | ORAL | 2 refills | Status: DC
  Filled 2020-11-20: qty 30, 30d supply, fill #0

## 2020-11-20 NOTE — Patient Instructions (Signed)
Plan: Avoid overhead lifting and overhead use of the arms. Pillows to keep from sleeping directly on the shoulders Limited lifting to less than 10 lbs. Ice or heat for relief. NSAIDs are helpful, such as diclofenac, be careful not to use in excess as they place burdens on the kidney. Stretching exercise help and strengthening is helpful to build endurance.

## 2020-11-20 NOTE — Progress Notes (Signed)
Office Visit Note   Patient: Patrick Ortega           Date of Birth: 08-21-1940           MRN: [id number] Visit Date: 11/20/2020              Requested by: Mayers, Loraine Grip, PA-C [id number],  [place] [id number] PCP: [name], MD   Assessment & Plan: Visit Diagnoses:  1. Neck pain   2. Chronic right shoulder pain   3. Weakness of shoulder   4. Chronic left shoulder pain   5. Rotator cuff arthropathy of both shoulders   6. Herniation of cervical intervertebral disc with radiculopathy   7. Spinal stenosis of cervical region   8. DDD (degenerative disc disease), cervical     Plan: Avoid overhead lifting and overhead use of the arms. Pillows to keep from sleeping directly on the shoulders Limited lifting to less than 10 lbs. Ice or heat for relief. NSAIDs are helpful, such as diclofenac, be careful not to use in excess as they place burdens on the kidney. Stretching exercise help and strengthening is helpful to build endurance.    Follow-Up Instructions: Return in about 3 weeks (around 12/11/2020).   Orders:  Orders Placed This Encounter  Procedures   XR Cervical Spine 2 or 3 views   XR Shoulder Left   MR Shoulder Left w/o contrast   Meds ordered this encounter  Medications   diclofenac (VOLTAREN) 50 MG EC tablet    Sig: Take 1 tablet (50 mg total) by mouth daily with breakfast.    Dispense:  30 tablet    Refill:  2      Procedures: No procedures performed   Clinical Data: No additional findings.   Subjective: Chief Complaint  Patient presents with   Head - Pain    80 year old right handed male, hispanic and speaks only spanish. Interpreter is present today, Patrick Ortega his grand daughter is with him. Reports that he started having neck pain that was worse starting in March, 2022. Prior  to then he had neck discomfort but reports that he was seen to an  Arthritis doctor here and was referred back to [facility] and saw Dr. Ernestina Patches. Dr.  Ernestina Patches examined him and performed injections. The shots seemed to help temporarily relieve the pain but did not fully relieve the pain. He went to PT this year And taught him exercises and did accupunture. He reports that that did not help. He had 3 ESIs at the C7-T1 level and then right C5-6 and C6-7 facet blocks. He had some relief with the ESIs but not with facet blocks. The ESIs seemed to help for about 2 weeks. He has dizziness with walking and  Has no clumbsiness or weakness. Says that walking she feels dizzy. He has pain in the shoulders, no pain with lying on the shoulders. No weakness with raising arms over head.  No numbness or paresthesias.    Review of Systems   Objective: Vital Signs: BP (!) 147/67 (BP Location: Left Arm, Patient Position: Sitting)   Pulse 76   Ht 5' (1.524 m)   Wt 163 lb 6.4 oz (74.1 kg)   BMI 31.91 kg/m   Physical Exam  Ortho Exam  Specialty Comments:  No specialty comments available.  Imaging: XR Cervical Spine 2 or 3 views  Result Date: 11/20/2020 AP and lateral flexion and extension radiographs of the cervical spine demonstrate straightening of the  cervical spine throught the mid and lower segment C4 to C7. DDD at C4-5, C5-6 and C6-7 worst at C5-6 and C6-7. MRI with centraland right sided disc herniation C5-6. Mild cervical canal narrowing at C6-7. No cord changes. The disc protrusion at C5-6 would be expected to cause symptoms either arm.   XR Shoulder Left  Result Date: 11/20/2020 Left shoulder 3 way view, AP lateral and outlet view shows the SAS is narrowed severely with superior subluxation of the left humeral head on the the glenoid. There is moderate DJD of the AC joint. Findings consistent with chronic Cuff deficient left shoulder.     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

## 2020-11-21 ENCOUNTER — Other Ambulatory Visit: Payer: Self-pay

## 2020-12-11 ENCOUNTER — Ambulatory Visit: Payer: Medicare Other | Admitting: Physician Assistant

## 2020-12-17 ENCOUNTER — Ambulatory Visit: Payer: Medicare Other | Admitting: Specialist

## 2020-12-19 ENCOUNTER — Ambulatory Visit
Admission: RE | Admit: 2020-12-19 | Discharge: 2020-12-19 | Disposition: A | Discharge status: Home / Self Care | Payer: Medicare Other | Source: Ambulatory Visit | Attending: Specialist | Admitting: Specialist

## 2020-12-19 DIAGNOSIS — M12812 Other specific arthropathies, not elsewhere classified, left shoulder: Secondary | ICD-10-CM

## 2020-12-19 DIAGNOSIS — R29898 Other symptoms and signs involving the musculoskeletal system: Secondary | ICD-10-CM

## 2020-12-19 DIAGNOSIS — M25512 Pain in left shoulder: Secondary | ICD-10-CM

## 2020-12-19 DIAGNOSIS — M12811 Other specific arthropathies, not elsewhere classified, right shoulder: Secondary | ICD-10-CM

## 2020-12-19 DIAGNOSIS — M75122 Complete rotator cuff tear or rupture of left shoulder, not specified as traumatic: Secondary | ICD-10-CM | POA: Diagnosis not present

## 2020-12-19 DIAGNOSIS — G8929 Other chronic pain: Secondary | ICD-10-CM

## 2021-01-12 ENCOUNTER — Other Ambulatory Visit: Payer: Self-pay

## 2021-01-12 ENCOUNTER — Ambulatory Visit: Payer: Medicare Other | Admitting: Specialist

## 2021-01-19 ENCOUNTER — Ambulatory Visit (INDEPENDENT_AMBULATORY_CARE_PROVIDER_SITE_OTHER): Payer: Medicare Other | Admitting: Specialist

## 2021-01-19 ENCOUNTER — Other Ambulatory Visit: Payer: Self-pay

## 2021-01-19 ENCOUNTER — Encounter: Payer: Self-pay | Admitting: Specialist

## 2021-01-19 VITALS — BP 144/72 | HR 72 | Ht 60.0 in | Wt 163.5 lb

## 2021-01-19 DIAGNOSIS — R202 Paresthesia of skin: Secondary | ICD-10-CM | POA: Diagnosis not present

## 2021-01-19 DIAGNOSIS — M12812 Other specific arthropathies, not elsewhere classified, left shoulder: Secondary | ICD-10-CM | POA: Diagnosis not present

## 2021-01-19 DIAGNOSIS — M12811 Other specific arthropathies, not elsewhere classified, right shoulder: Secondary | ICD-10-CM

## 2021-01-19 DIAGNOSIS — M4802 Spinal stenosis, cervical region: Secondary | ICD-10-CM

## 2021-01-19 DIAGNOSIS — M75122 Complete rotator cuff tear or rupture of left shoulder, not specified as traumatic: Secondary | ICD-10-CM

## 2021-01-19 DIAGNOSIS — R200 Anesthesia of skin: Secondary | ICD-10-CM

## 2021-01-19 DIAGNOSIS — M5030 Other cervical disc degeneration, unspecified cervical region: Secondary | ICD-10-CM

## 2021-01-19 DIAGNOSIS — M542 Cervicalgia: Secondary | ICD-10-CM

## 2021-01-19 DIAGNOSIS — R29898 Other symptoms and signs involving the musculoskeletal system: Secondary | ICD-10-CM

## 2021-01-19 DIAGNOSIS — G8929 Other chronic pain: Secondary | ICD-10-CM

## 2021-01-19 DIAGNOSIS — M25512 Pain in left shoulder: Secondary | ICD-10-CM | POA: Diagnosis not present

## 2021-01-19 NOTE — Patient Instructions (Signed)
Avoid overhead lifting and overhead use of the arms. Pillows to keep from sleeping directly on the shoulders Limited lifting to less than 10 lbs. Ice or heat for relief. NSAIDs are helpful, such as alleve or motrin, be careful not to use in excess as they place burdens on the kidney. Stretching exercise help and strengthening is helpful to build endurance.  Referral to physical therapy to work on left shoulder ROM, stretching and strenthening. Referral to Dr.Newton for EMG/NCV of the arms due to numbness and tingling both arms. Request  2nd opinion from Dr. Marlou Sa concerning left shoulder complete cuff tear, 5 cm retracted with mild atrophy of the SS

## 2021-01-19 NOTE — Progress Notes (Signed)
Office Visit Note   Patient: Patrick Ortega           Date of Birth: 24-Apr-1940           MRN: [id number] Visit Date: 01/19/2021              Requested by: [name], MD [address],  [place] [id number] PCP: [name], MD   Assessment & Plan: Visit Diagnoses:  1. Weakness of shoulder   2. Rotator cuff arthropathy of both shoulders   3. Chronic left shoulder pain   4. Spinal stenosis of cervical region   5. DDD (degenerative disc disease), cervical   6. Cervicalgia   7. Bilateral numbness and tingling of arms and legs   8. Nontraumatic complete tear of left rotator cuff     Plan: Avoid overhead lifting and overhead use of the arms. Pillows to keep from sleeping directly on the shoulders Limited lifting to less than 10 lbs. Ice or heat for relief. NSAIDs are helpful, such as alleve or motrin, be careful not to use in excess as they place burdens on the kidney. Stretching exercise help and strengthening is helpful to build endurance.  Referral to physical therapy to work on left shoulder ROM, stretching and strenthening. Referral to Dr.Newton for EMG/NCV of the arms due to numbness and tingling both arms. Request  2nd opinion from Dr. Marlou Sa concerning left shoulder complete cuff tear, 5 cm retracted with mild atrophy of the SS  Follow-Up Instructions: Return in about 3 weeks (around 02/09/2021) for Return visit with Dr. Marlou Sa in next 2 weeks to assess left complete RCT .   Orders:  Orders Placed This Encounter  Procedures   Ambulatory referral to Physical Medicine Rehab   Ambulatory referral to Physical Therapy   No orders of the defined types were placed in this encounter.     Procedures: No procedures performed   Clinical Data: No additional findings.   Subjective: Chief Complaint  Patient presents with   Left Shoulder - Follow-up    MRI Review    81 year right handed male with one year history of left shoulder pain and weakness.  Seen with neck pain which has improved with therapy but pain and weakness in the left shouder has persisted. He underwent MRI due to continued left arm shoulder weakness dispite decreased neck pain. MRI with left shoulder cuff tear with 4.6 cm of retraction, mild atrophy of the left supraspinatous and moderate atrophy fo the intraspinatous. Right arm is some numbness and tingling Like ants crawling on the left arm.    Review of Systems   Objective: Vital Signs: BP (!) 144/72 (BP Location: Left Arm, Patient Position: Sitting)    Pulse 72    Ht 5' (1.524 m)    Wt 163 lb 8 oz (74.2 kg)    BMI 31.93 kg/m   Physical Exam  Ortho Exam  Specialty Comments:  No specialty comments available.  Imaging: No results found.   PMFS History: Patient Active Problem List   Diagnosis Date Noted   Vitamin D deficiency 07/10/2020   Elevated PSA 12/11/2019   Urinary frequency 12/10/2019   Cervical radiculopathy 12/10/2019   Chronic right shoulder pain 12/10/2019   Essential hypertension 05/24/2014   Osteoarthritis of left hip 05/24/2014   Past Medical History:  Diagnosis Date   GERD (gastroesophageal reflux disease)    Hypertension     Family History  Problem Relation Age of Onset   Ulcers  Mother     Past Surgical History:  Procedure Laterality Date   NO PAST SURGERIES     Social History   Occupational History   Not on file  Tobacco Use   Smoking status: Former    Packs/day: 0.25    Years: 0.50    Pack years: 0.13    Types: Cigarettes   Smokeless tobacco: Never  Vaping Use   Vaping Use: Never used  Substance and Sexual Activity   Alcohol use: Not Currently   Drug use: Not on file   Sexual activity: Yes

## 2021-01-23 ENCOUNTER — Telehealth: Payer: Self-pay | Admitting: Critical Care Medicine

## 2021-01-23 DIAGNOSIS — K056 Periodontal disease, unspecified: Secondary | ICD-10-CM

## 2021-01-23 NOTE — Telephone Encounter (Signed)
Patient came into clinic to request a referral be sent to dentist. Needs dental work done.

## 2021-01-26 NOTE — Telephone Encounter (Signed)
Fyi.

## 2021-01-27 NOTE — Telephone Encounter (Signed)
Called pt to let him know about referral but no answer/no vm  Interpreter [id number]

## 2021-02-03 DIAGNOSIS — D075 Carcinoma in situ of prostate: Secondary | ICD-10-CM | POA: Diagnosis not present

## 2021-02-03 DIAGNOSIS — R972 Elevated prostate specific antigen [PSA]: Secondary | ICD-10-CM | POA: Diagnosis not present

## 2021-02-06 ENCOUNTER — Encounter: Payer: Medicare Other | Admitting: Physical Medicine and Rehabilitation

## 2021-02-09 ENCOUNTER — Other Ambulatory Visit: Payer: Self-pay

## 2021-02-16 ENCOUNTER — Ambulatory Visit: Payer: Medicare Other | Admitting: Specialist

## 2021-02-26 ENCOUNTER — Ambulatory Visit (INDEPENDENT_AMBULATORY_CARE_PROVIDER_SITE_OTHER): Payer: Medicare Other | Admitting: Surgery

## 2021-02-26 ENCOUNTER — Other Ambulatory Visit: Payer: Self-pay

## 2021-02-26 ENCOUNTER — Encounter: Payer: Self-pay | Admitting: Surgery

## 2021-02-26 DIAGNOSIS — M12811 Other specific arthropathies, not elsewhere classified, right shoulder: Secondary | ICD-10-CM

## 2021-02-26 DIAGNOSIS — R200 Anesthesia of skin: Secondary | ICD-10-CM

## 2021-02-26 DIAGNOSIS — M12812 Other specific arthropathies, not elsewhere classified, left shoulder: Secondary | ICD-10-CM

## 2021-02-26 DIAGNOSIS — R202 Paresthesia of skin: Secondary | ICD-10-CM

## 2021-02-26 NOTE — Progress Notes (Signed)
81 year old male comes in with interpreter today for  Visit Diagnoses:  1. Weakness of shoulder   2. Rotator cuff arthropathy of both shoulders   3. Chronic left shoulder pain   4. Spinal stenosis of cervical region   5. DDD (degenerative disc disease), cervical   6. Cervicalgia   7. Bilateral numbness and tingling of arms and legs   8. Nontraumatic complete tear of left rotator cuff     Last seen by Dr. Louanne Skye January 19, 2021 and he had recommended patient go to formal PT, have NCV/EMG study and also see Dr. Marlou Sa for left shoulder rotator cuff tear.  Patient has not had these appointments yet.  Kathlee Nations assisted with getting appointment for NCV/EMG study and also Dr. Marlou Sa.  Follow-up with Dr. Louanne Skye as scheduled after NCV/EMG.  All questions answered.

## 2021-03-08 NOTE — Progress Notes (Signed)
Established Patient Office Visit  Subjective:  Patient ID: Patrick Ortega, male    DOB: 1940-11-03  Age: 81 y.o. MRN: [id number]  CC:  Chief Complaint  Patient presents with   Neck Pain   Medication Refill    HPI Patrick Ortega presents for primary care follow-up.  This patient was seen with [facility] interpreter audio Spanish andy [phone] Patient continues to have neck and shoulder pain.  He is being evaluated extensively by orthopedics and has follow-up appointments over the next 2 weeks for nerve conduction study and shoulder evaluations.  He continues to complain of the pain today and I reminded him and his son who is with him today who his name is [name] these appointments are available and he should follow-up on them.  Another issue is he complains of irritation at the end of his penis burning on urination and difficulty urinating.  He been seen by urology previously was given high-dose Flomax and was told to follow-up expectantly on his PSA that was elevated at 7.0 he does not have follow-up appointments with urology currently.  He was last seen by urology December 2021. Past Medical History:  Diagnosis Date   GERD (gastroesophageal reflux disease)    Hypertension     Past Surgical History:  Procedure Laterality Date   NO PAST SURGERIES      Family History  Problem Relation Age of Onset   Ulcers Mother     Social History   Socioeconomic History   Marital status: Married    Spouse name: Not on file   Number of children: Not on file   Years of education: Not on file   Highest education level: Not on file  Occupational History   Not on file  Tobacco Use   Smoking status: Former    Packs/day: 0.25    Years: 0.50    Pack years: 0.13    Types: Cigarettes   Smokeless tobacco: Never  Vaping Use   Vaping Use: Never used  Substance and Sexual Activity   Alcohol use: Not Currently   Drug use: Not on file   Sexual activity: Yes  Other Topics Concern   Not on file   Social History Narrative   Not on file   Social Determinants of Health   Financial Resource Strain: Not on file  Food Insecurity: Not on file  Transportation Needs: Not on file  Physical Activity: Not on file  Stress: Not on file  Social Connections: Not on file  Intimate Partner Violence: Not on file    Outpatient Medications Prior to Visit  Medication Sig Dispense Refill   amLODipine (NORVASC) 10 MG tablet Take 1 tablet (10 mg total) by mouth daily. 90 tablet 2   chlorthalidone (HYGROTON) 25 MG tablet TAKE 1 TABLET (25 MG TOTAL) BY MOUTH DAILY. 90 tablet 2   diclofenac (VOLTAREN) 50 MG EC tablet Take 1 tablet (50 mg total) by mouth daily with breakfast. 30 tablet 2   gabapentin (NEURONTIN) 300 MG capsule Take 1 capsule (300 mg total) by mouth 3 (three) times daily. For numbness in hands 90 capsule 3   tamsulosin (FLOMAX) 0.4 MG CAPS capsule TAKE 2 CAPSULES (0.8 MG TOTAL) BY MOUTH DAILY. 60 capsule 3   No facility-administered medications prior to visit.    No Known Allergies  ROS Review of Systems  Constitutional: Negative.   HENT: Negative.  Negative for ear pain, postnasal drip, rhinorrhea, sinus pressure, sore throat, trouble swallowing and voice change.   Eyes: Negative.  Respiratory: Negative.  Negative for apnea, cough, choking, chest tightness, shortness of breath, wheezing and stridor.   Cardiovascular: Negative.  Negative for chest pain, palpitations and leg swelling.  Gastrointestinal: Negative.  Negative for abdominal distention, abdominal pain, nausea and vomiting.  Genitourinary: Negative.   Musculoskeletal: Negative.  Negative for arthralgias and myalgias.  Skin: Negative.  Negative for rash.  Allergic/Immunologic: Negative.  Negative for environmental allergies and food allergies.  Neurological: Negative.  Negative for dizziness, syncope, weakness and headaches.  Hematological: Negative.  Negative for adenopathy. Does not bruise/bleed easily.   Psychiatric/Behavioral: Negative.  Negative for agitation and sleep disturbance. The patient is not nervous/anxious.      Objective:    Physical Exam Vitals reviewed.  Constitutional:      Appearance: Normal appearance. He is well-developed. He is not diaphoretic.  HENT:     Head: Normocephalic and atraumatic.     Nose: No nasal deformity, septal deviation, mucosal edema or rhinorrhea.     Right Sinus: No maxillary sinus tenderness or frontal sinus tenderness.     Left Sinus: No maxillary sinus tenderness or frontal sinus tenderness.     Mouth/Throat:     Pharynx: No oropharyngeal exudate.  Eyes:     General: No scleral icterus.    Conjunctiva/sclera: Conjunctivae normal.     Pupils: Pupils are equal, round, and reactive to light.  Neck:     Thyroid: No thyromegaly.     Vascular: No carotid bruit or JVD.     Trachea: Trachea normal. No tracheal tenderness or tracheal deviation.  Cardiovascular:     Rate and Rhythm: Normal rate and regular rhythm.     Chest Wall: PMI is not displaced.     Pulses: Normal pulses. No decreased pulses.     Heart sounds: Normal heart sounds, S1 normal and S2 normal. Heart sounds not distant. No murmur heard. No systolic murmur is present.  No diastolic murmur is present.    No friction rub. No gallop. No S3 or S4 sounds.  Pulmonary:     Effort: No tachypnea, accessory muscle usage or respiratory distress.     Breath sounds: No stridor. No decreased breath sounds, wheezing, rhonchi or rales.  Chest:     Chest wall: No tenderness.  Abdominal:     General: Bowel sounds are normal. There is no distension.     Palpations: Abdomen is soft. Abdomen is not rigid.     Tenderness: There is no abdominal tenderness. There is no guarding or rebound.  Genitourinary:    Penis: Normal.      Testes: Normal.     Rectum: Normal.     Comments: Prostate enlarged and firm, no nodules Musculoskeletal:     Cervical back: Normal range of motion and neck supple. No  edema, erythema or rigidity. No muscular tenderness. Normal range of motion.     Comments: Decreased rom of both shoulders.    Lymphadenopathy:     Head:     Right side of head: No submental or submandibular adenopathy.     Left side of head: No submental or submandibular adenopathy.     Cervical: No cervical adenopathy.  Skin:    General: Skin is warm and dry.     Coloration: Skin is not pale.     Findings: No rash.     Nails: There is no clubbing.  Neurological:     General: No focal deficit present.     Mental Status: He is alert and oriented to  person, place, and time.     Sensory: No sensory deficit.  Psychiatric:        Mood and Affect: Mood normal.        Speech: Speech normal.        Behavior: Behavior normal.        Thought Content: Thought content normal.        Judgment: Judgment normal.    BP 135/74    Pulse 71    Wt 160 lb 6.4 oz (72.8 kg)    SpO2 95%    BMI 31.33 kg/m  Wt Readings from Last 3 Encounters:  03/10/21 160 lb 6.4 oz (72.8 kg)  01/19/21 163 lb 8 oz (74.2 kg)  11/20/20 163 lb 6.4 oz (74.1 kg)     Health Maintenance Due  Topic Date Due   COVID-19 Vaccine (4 - Booster for Pfizer series) 03/11/2020    There are no preventive care reminders to display for this patient.  Lab Results  Component Value Date   TSH 2.080 04/25/2019   Lab Results  Component Value Date   WBC 13.1 (H) 07/09/2020   HGB 14.5 07/09/2020   HCT 41.8 07/09/2020   MCV 85 07/09/2020   PLT 416 07/09/2020   Lab Results  Component Value Date   NA 139 07/09/2020   K 3.4 (L) 07/09/2020   CO2 25 10/12/2019   GLUCOSE 89 07/09/2020   BUN 22 07/09/2020   CREATININE 1.01 07/09/2020   BILITOT 0.3 07/09/2020   ALKPHOS 118 07/09/2020   AST 26 07/09/2020   ALT 15 04/25/2019   PROT 8.0 07/09/2020   ALBUMIN 4.7 (H) 07/09/2020   CALCIUM 10.1 07/09/2020   EGFR 75 07/09/2020   Lab Results  Component Value Date   CHOL 195 05/09/2019   Lab Results  Component Value Date   HDL  64 05/09/2019   Lab Results  Component Value Date   LDLCALC 112 (H) 05/09/2019   Lab Results  Component Value Date   TRIG 110 05/09/2019   Lab Results  Component Value Date   CHOLHDL 3.0 05/09/2019   No results found for: HGBA1C    Assessment & Plan:   Problem List Items Addressed This Visit       Cardiovascular and Mediastinum   Essential hypertension    Well controlled. No change in medications and refilled      Relevant Medications   amLODipine (NORVASC) 10 MG tablet   chlorthalidone (HYGROTON) 25 MG tablet   Other Relevant Orders   CBC with Differential/Platelet     Nervous and Auditory   Cervical radiculopathy    Pt to f/u on emg /ncv      Relevant Medications   gabapentin (NEURONTIN) 300 MG capsule     Other   Urinary frequency - Primary    As per psa       Relevant Orders   Urinalysis   Ambulatory referral to Urology   Chronic right shoulder pain    Follow up per ortho      Relevant Medications   diclofenac (VOLTAREN) 50 MG EC tablet   gabapentin (NEURONTIN) 300 MG capsule   Elevated PSA    PSA elevated and prostate enlarged.   Recheck PSA and UA and refer patient to urology continue flomax      Relevant Orders   PSA   Ambulatory referral to Urology   Vitamin D deficiency   Relevant Orders   VITAMIN D 25 Hydroxy (Vit-D Deficiency, Fractures)   Other  Visit Diagnoses     Uncontrolled hypertension       Relevant Medications   amLODipine (NORVASC) 10 MG tablet   chlorthalidone (HYGROTON) 25 MG tablet   Other Relevant Orders   Comprehensive metabolic panel       Meds ordered this encounter  Medications   amLODipine (NORVASC) 10 MG tablet    Sig: Take 1 tablet (10 mg total) by mouth daily.    Dispense:  90 tablet    Refill:  2   chlorthalidone (HYGROTON) 25 MG tablet    Sig: TAKE 1 TABLET (25 MG TOTAL) BY MOUTH DAILY.    Dispense:  90 tablet    Refill:  2   diclofenac (VOLTAREN) 50 MG EC tablet    Sig: Take 1 tablet (50 mg  total) by mouth daily with breakfast.    Dispense:  30 tablet    Refill:  2   gabapentin (NEURONTIN) 300 MG capsule    Sig: Take 1 capsule (300 mg total) by mouth 3 (three) times daily. For numbness in hands    Dispense:  90 capsule    Refill:  3   tamsulosin (FLOMAX) 0.4 MG CAPS capsule    Sig: TAKE 2 CAPSULES (0.8 MG TOTAL) BY MOUTH DAILY.    Dispense:  60 capsule    Refill:  3    Follow-up: Return in about 4 months (around 07/10/2021).    Asencion Noble, MD

## 2021-03-10 ENCOUNTER — Encounter: Payer: Self-pay | Admitting: Critical Care Medicine

## 2021-03-10 ENCOUNTER — Ambulatory Visit: Payer: Medicare Other | Attending: Critical Care Medicine | Admitting: Critical Care Medicine

## 2021-03-10 ENCOUNTER — Other Ambulatory Visit: Payer: Self-pay

## 2021-03-10 VITALS — BP 135/74 | HR 71 | Wt 160.4 lb

## 2021-03-10 DIAGNOSIS — G8929 Other chronic pain: Secondary | ICD-10-CM | POA: Diagnosis not present

## 2021-03-10 DIAGNOSIS — R972 Elevated prostate specific antigen [PSA]: Secondary | ICD-10-CM | POA: Insufficient documentation

## 2021-03-10 DIAGNOSIS — Z7901 Long term (current) use of anticoagulants: Secondary | ICD-10-CM | POA: Diagnosis not present

## 2021-03-10 DIAGNOSIS — Z79899 Other long term (current) drug therapy: Secondary | ICD-10-CM | POA: Insufficient documentation

## 2021-03-10 DIAGNOSIS — E559 Vitamin D deficiency, unspecified: Secondary | ICD-10-CM | POA: Insufficient documentation

## 2021-03-10 DIAGNOSIS — I10 Essential (primary) hypertension: Secondary | ICD-10-CM | POA: Insufficient documentation

## 2021-03-10 DIAGNOSIS — R350 Frequency of micturition: Secondary | ICD-10-CM | POA: Diagnosis not present

## 2021-03-10 DIAGNOSIS — N401 Enlarged prostate with lower urinary tract symptoms: Secondary | ICD-10-CM | POA: Diagnosis not present

## 2021-03-10 DIAGNOSIS — M25511 Pain in right shoulder: Secondary | ICD-10-CM | POA: Diagnosis not present

## 2021-03-10 DIAGNOSIS — M5412 Radiculopathy, cervical region: Secondary | ICD-10-CM | POA: Diagnosis not present

## 2021-03-10 DIAGNOSIS — Z760 Encounter for issue of repeat prescription: Secondary | ICD-10-CM | POA: Diagnosis not present

## 2021-03-10 MED ORDER — TAMSULOSIN HCL 0.4 MG PO CAPS
ORAL_CAPSULE | ORAL | 3 refills | Status: DC
  Filled 2021-03-10: qty 60, 30d supply, fill #0
  Filled 2021-05-18: qty 60, 30d supply, fill #1
  Filled 2021-07-13: qty 60, 30d supply, fill #2

## 2021-03-10 MED ORDER — DICLOFENAC SODIUM 50 MG PO TBEC
50.0000 mg | DELAYED_RELEASE_TABLET | Freq: Every day | ORAL | 2 refills | Status: DC
  Filled 2021-03-10: qty 30, 30d supply, fill #0
  Filled 2021-04-07: qty 30, 30d supply, fill #1
  Filled 2021-05-05: qty 30, 30d supply, fill #2

## 2021-03-10 MED ORDER — AMLODIPINE BESYLATE 10 MG PO TABS
10.0000 mg | ORAL_TABLET | Freq: Every day | ORAL | 2 refills | Status: DC
  Filled 2021-03-10 – 2021-04-07 (×2): qty 90, 90d supply, fill #0
  Filled 2021-07-13: qty 90, 90d supply, fill #1

## 2021-03-10 MED ORDER — GABAPENTIN 300 MG PO CAPS
300.0000 mg | ORAL_CAPSULE | Freq: Three times a day (TID) | ORAL | 3 refills | Status: DC
  Filled 2021-03-10: qty 90, 30d supply, fill #0
  Filled 2021-05-18: qty 90, 30d supply, fill #1
  Filled 2021-07-13: qty 90, 30d supply, fill #2

## 2021-03-10 MED ORDER — CHLORTHALIDONE 25 MG PO TABS
ORAL_TABLET | Freq: Every day | ORAL | 2 refills | Status: DC
  Filled 2021-03-10: qty 90, fill #0
  Filled 2021-04-07: qty 90, 90d supply, fill #0
  Filled 2021-07-13: qty 90, 90d supply, fill #1

## 2021-03-10 NOTE — Assessment & Plan Note (Signed)
PSA elevated and prostate enlarged.   Recheck PSA and UA and refer patient to urology continue flomax ?

## 2021-03-10 NOTE — Assessment & Plan Note (Signed)
As per psa  ?

## 2021-03-10 NOTE — Patient Instructions (Addendum)
Referral to urology was made ? ?Labs today : metabolic panel, blood counts, urine study, PSA ? ?Refills on all medications sent to our pharmacy ? ?Keep your orthopedic appointments ? ?Return Dr Joya Gaskins 4 months ? ?Se hizo derivaci?n a urolog?a. ? ?Laboratorios hoy: panel metab?lico, hemograma, estudio de [place], PSA ? ?Resurtidos de [employer] enviados a nuestra farmacia ? ?Mantenga sus citas ortop?dicas ? ?Volver Dr. Joya Gaskins 4 meses ?

## 2021-03-10 NOTE — Assessment & Plan Note (Signed)
Well controlled. No change in medications and refilled ?

## 2021-03-10 NOTE — Assessment & Plan Note (Signed)
Follow up per ortho ?

## 2021-03-10 NOTE — Assessment & Plan Note (Signed)
Pt to f/u on emg /ncv ?

## 2021-03-11 ENCOUNTER — Ambulatory Visit (INDEPENDENT_AMBULATORY_CARE_PROVIDER_SITE_OTHER): Payer: Medicare Other | Admitting: Orthopedic Surgery

## 2021-03-11 DIAGNOSIS — M12811 Other specific arthropathies, not elsewhere classified, right shoulder: Secondary | ICD-10-CM | POA: Diagnosis not present

## 2021-03-11 DIAGNOSIS — M12812 Other specific arthropathies, not elsewhere classified, left shoulder: Secondary | ICD-10-CM | POA: Diagnosis not present

## 2021-03-11 LAB — CBC WITH DIFFERENTIAL/PLATELET
Basophils Absolute: 0.1 10*3/uL (ref 0.0–0.2)
Basos: 1 %
EOS (ABSOLUTE): 0.2 10*3/uL (ref 0.0–0.4)
Eos: 1 %
Hematocrit: 45.4 % (ref 37.5–51.0)
Hemoglobin: 15 g/dL (ref 13.0–17.7)
Immature Grans (Abs): 0 10*3/uL (ref 0.0–0.1)
Immature Granulocytes: 0 %
Lymphocytes Absolute: 3.9 10*3/uL — ABNORMAL HIGH (ref 0.7–3.1)
Lymphs: 32 %
MCH: 27.9 pg (ref 26.6–33.0)
MCHC: 33 g/dL (ref 31.5–35.7)
MCV: 85 fL (ref 79–97)
Monocytes Absolute: 1 10*3/uL — ABNORMAL HIGH (ref 0.1–0.9)
Monocytes: 9 %
Neutrophils Absolute: 6.9 10*3/uL (ref 1.4–7.0)
Neutrophils: 57 %
Platelets: 412 10*3/uL (ref 150–450)
RBC: 5.37 x10E6/uL (ref 4.14–5.80)
RDW: 13.9 % (ref 11.6–15.4)
WBC: 12 10*3/uL — ABNORMAL HIGH (ref 3.4–10.8)

## 2021-03-11 LAB — COMPREHENSIVE METABOLIC PANEL
ALT: 18 IU/L (ref 0–44)
AST: 25 IU/L (ref 0–40)
Albumin/Globulin Ratio: 1.4 (ref 1.2–2.2)
Albumin: 5 g/dL — ABNORMAL HIGH (ref 3.6–4.6)
Alkaline Phosphatase: 119 IU/L (ref 44–121)
BUN/Creatinine Ratio: 16 (ref 10–24)
BUN: 16 mg/dL (ref 8–27)
Bilirubin Total: 0.5 mg/dL (ref 0.0–1.2)
CO2: 27 mmol/L (ref 20–29)
Calcium: 10.5 mg/dL — ABNORMAL HIGH (ref 8.6–10.2)
Chloride: 99 mmol/L (ref 96–106)
Creatinine, Ser: 1.01 mg/dL (ref 0.76–1.27)
Globulin, Total: 3.6 g/dL (ref 1.5–4.5)
Glucose: 95 mg/dL (ref 70–99)
Potassium: 4.5 mmol/L (ref 3.5–5.2)
Sodium: 140 mmol/L (ref 134–144)
Total Protein: 8.6 g/dL — ABNORMAL HIGH (ref 6.0–8.5)
eGFR: 75 mL/min/{1.73_m2} (ref >59)

## 2021-03-11 LAB — URINALYSIS
Bilirubin, UA: NEGATIVE
Glucose, UA: NEGATIVE
Ketones, UA: NEGATIVE
Leukocytes,UA: NEGATIVE
Nitrite, UA: NEGATIVE
Protein,UA: NEGATIVE
Specific Gravity, UA: 1.016 (ref 1.005–1.030)
Urobilinogen, Ur: 0.2 mg/dL (ref 0.2–1.0)
pH, UA: 7 (ref 5.0–7.5)

## 2021-03-11 LAB — VITAMIN D 25 HYDROXY (VIT D DEFICIENCY, FRACTURES): Vit D, 25-Hydroxy: 28.8 ng/mL — ABNORMAL LOW (ref 30.0–100.0)

## 2021-03-11 LAB — PSA: Prostate Specific Ag, Serum: 17.1 ng/mL — ABNORMAL HIGH (ref 0.0–4.0)

## 2021-03-12 ENCOUNTER — Telehealth: Payer: Self-pay

## 2021-03-12 ENCOUNTER — Encounter: Payer: Self-pay | Admitting: Orthopedic Surgery

## 2021-03-12 MED ORDER — LIDOCAINE HCL 1 % IJ SOLN
5.0000 mL | INTRAMUSCULAR | Status: AC | PRN
  Administered 2021-03-11: 22:00:00 5 mL

## 2021-03-12 MED ORDER — BUPIVACAINE HCL 0.5 % IJ SOLN
9.0000 mL | INTRAMUSCULAR | Status: AC | PRN
  Administered 2021-03-11: 22:00:00 9 mL via INTRA_ARTICULAR

## 2021-03-12 MED ORDER — METHYLPREDNISOLONE ACETATE 40 MG/ML IJ SUSP
40.0000 mg | INTRAMUSCULAR | Status: AC | PRN
  Administered 2021-03-11: 22:00:00 40 mg via INTRA_ARTICULAR

## 2021-03-12 NOTE — Progress Notes (Signed)
? ?Office Visit Note ?  ?Patient: Patrick Ortega           ?Date of Birth: 1940-07-05           ?MRN: [id number] ?Visit Date: 03/11/2021 ?Requested by: [name], MD ?[phone] ?[place],  [place] [address] ?PCP: [name], MD ? ?Subjective: ?Chief Complaint  ?Patient presents with  ? Left Shoulder - Pain  ? ? ?HPI: Patient presents for evaluation of bilateral shoulder pain.  Right one is hurting worse than the left one today.  Reports a low level of constant pain and some weakness.  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  Started about 5 or 6 months ago.  No history of injury.  Takes Neurontin and uses Voltaren cream for the shoulders.  He is here with his son who translates. ?             ?ROS: All systems reviewed are negative as they relate to the chief complaint within the history of present illness.  Patient denies  fevers or chills. ? ? ?Assessment & Plan: ?Visit Diagnoses:  ?1. Rotator cuff arthropathy of both shoulders   ? ? ?Plan: Impression is bilateral shoulder rotator cuff arthropathy.  Overall patient is fairly functional with his shoulders.  He is able to achieve forward flexion and abduction both above 90 degrees.  Pain is not keeping him awake at night.  Recommend intra-articular injection on the right-hand side with return in 2 weeks for the left if he derives significant benefit from the right-sided injection.  I think he may need shoulder replacement in the future but for now I think he is doing well enough that we can hold off on that intervention.  Follow-up with [place] as needed ? ?Follow-Up Instructions: Return if symptoms worsen or fail to improve.  ? ?Orders:  ?No orders of the defined types were placed in this encounter. ? ?No orders of the defined types were placed in this encounter. ? ? ? ? Procedures: ?Large Joint Inj: R glenohumeral on 03/11/2021 10:19 PM ?Indications: diagnostic evaluation and pain ?Details: 18 G 1.5 in needle, posterior  approach ? ?Arthrogram: No ? ?Medications: 9 mL bupivacaine 0.5 %; 40 mg methylPREDNISolone acetate 40 MG/ML; 5 mL lidocaine 1 % ?Outcome: tolerated well, no immediate complications ?Procedure, treatment alternatives, risks and benefits explained, specific risks discussed. Consent was given by the patient. Immediately prior to procedure a time out was called to verify the correct patient, procedure, equipment, support staff and site/side marked as required. Patient was prepped and draped in the usual sterile fashion.  ? ? ? ? ?Clinical Data: ?No additional findings. ? ?Objective: ?Vital Signs: There were no vitals taken for this visit. ? ?Physical Exam:  ? ?Constitutional: Patient appears well-developed ?HEENT:  ?Head: Normocephalic ?Eyes:EOM are normal ?Neck: Normal range of motion ?Cardiovascular: Normal rate ?Pulmonary/chest: Effort normal ?Neurologic: Patient is alert ?Skin: Skin is warm ?Psychiatric: Patient has normal mood and affect ? ? ?Ortho Exam: Ortho exam demonstrates forward flexion and AB duction actively both above 90 degrees.  He does have some external rotation weakness bilaterally consistent with known rotator cuff arthropathy.  Deltoid fires.  Passive range of motion bilateral shoulders is 60/100/170.  Motor or sensory function to the hand intact right and left side.  Neck range of motion is nearly full. ? ?Specialty Comments:  ?No specialty comments available. ? ?Imaging: ?No results found. ? ? ?PMFS History: ?Patient Active Problem List  ?  Diagnosis Date Noted  ? Vitamin D deficiency 07/10/2020  ? Elevated PSA 12/11/2019  ? Urinary frequency 12/10/2019  ? Cervical radiculopathy 12/10/2019  ? Chronic right shoulder pain 12/10/2019  ? Essential hypertension 05/24/2014  ? 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 ?Past Medical History:  ?Diagnosis Date  ? GERD (gastroesophageal reflux disease)   ? Hypertension   ?  ?Family History  ?Problem Relation Age of Onset  ? Ulcers Mother   ?  ?Past Surgical  History:  ?Procedure Laterality Date  ? NO PAST SURGERIES    ? ?Social History  ? ?Occupational History  ? Not on file  ?Tobacco Use  ? Smoking status: Former  ?  Packs/day: 0.25  ?  Years: 0.50  ?  Pack years: 0.13  ?  Types: Cigarettes  ? Smokeless tobacco: Never  ?Vaping Use  ? Vaping Use: Never used  ?Substance and Sexual Activity  ? Alcohol use: Not Currently  ? Drug use: Not on file  ? Sexual activity: Yes  ? ? ? ? ? ?

## 2021-03-12 NOTE — Telephone Encounter (Signed)
Pt was called and vm was left, Information has been sent to nurse pool.   ? ?Interpreter id [phone] ?

## 2021-03-12 NOTE — Telephone Encounter (Signed)
-----   Message from [name], MD sent at 03/11/2021  5:53 AM EST ----- ?Let pt know urine study show trace amount of blood, PSA has risen   he needs to keep his urology appt when it is made, Vit D level ok  other labs normal ?

## 2021-03-16 ENCOUNTER — Ambulatory Visit: Payer: Medicare Other | Admitting: Critical Care Medicine

## 2021-03-16 ENCOUNTER — Other Ambulatory Visit: Payer: Self-pay

## 2021-03-18 ENCOUNTER — Encounter: Payer: Self-pay | Admitting: Physical Medicine and Rehabilitation

## 2021-03-18 ENCOUNTER — Other Ambulatory Visit: Payer: Self-pay

## 2021-03-18 ENCOUNTER — Ambulatory Visit (INDEPENDENT_AMBULATORY_CARE_PROVIDER_SITE_OTHER): Payer: Medicare Other | Admitting: Physical Medicine and Rehabilitation

## 2021-03-18 DIAGNOSIS — R202 Paresthesia of skin: Secondary | ICD-10-CM | POA: Diagnosis not present

## 2021-03-18 NOTE — Progress Notes (Signed)
Pt state he has pain and weakness in both shoulders. Pt state he works for a [employer] and he does a lot of pushing and lifting. Pt satte he uses pain cream to help ease his pain. Pt state he right handed. ? ?Numeric Pain Rating Scale and Functional Assessment ?Average Pain 6 ? ? ?In the last MONTH (on 0-10 scale) has pain interfered with the following? ? ?1. General activity like being  able to carry out your everyday physical activities such as walking, climbing stairs, carrying groceries, or moving a chair?  ?Rating(8) ? ? ? ? ?

## 2021-03-22 NOTE — Procedures (Signed)
EMG & NCV Findings: ?Evaluation of the left median motor and the right median motor nerves showed prolonged distal onset latency (L4.7, R4.8 ms), reduced amplitude (L4.3, R4.7 mV), and decreased conduction velocity (Elbow-Wrist, L41, R46 m/s).  The left median (across palm) sensory nerve showed no response (Palm) and prolonged distal peak latency (4.5 ms).  The right median (across palm) sensory nerve showed prolonged distal peak latency (Wrist, 4.4 ms).  All remaining nerves (as indicated in the following tables) were within normal limits.  Left vs. Right side comparison data for the ulnar motor nerve indicates abnormal L-R amplitude difference (68.4 %) and abnormal L-R velocity difference (A Elbow-B Elbow, 31 m/s).  All remaining left vs. right side differences were within normal limits.   ? ?All examined muscles (as indicated in the following table) showed no evidence of electrical instability.   ? 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 ? ?There is no significant electrodiagnostic evidence of any other focal nerve entrapment, brachial plexopathy or cervical radiculopathy.  ? ?Recommendations: ?1.  Follow-up with referring physician. ?2.  Continue current management of symptoms. ?3.  Suggest surgical evaluation. ? ?___________________________ ?Laurence Spates FAAPMR ?Board Certified, Tax adviser of Physical Medicine and Rehabilitation ? ? ? ?Nerve Conduction Studies ?Anti Sensory Summary Table ? ? Stim Site NR Peak (ms) Norm Peak (ms) P-T Amp (?V) Norm P-T Amp Site1 Site2 Delta-P (ms) Dist (cm) Vel (m/s) Norm Vel (m/s)  ?Left Median Acr Palm Anti Sensory (2nd Digit)  29.8?C  ?Wrist    *4.5 <3.6 21.0 >10 Wrist Palm  0.0    ?Palm *NR  <2.0          ?Right Median Acr Palm Anti Sensory (2nd Digit)  29.2?C  ?Wrist    *4.4 <3.6 38.4 >10 Wrist  Palm 2.6 0.0    ?Palm    1.8 <2.0 4.7         ?Left Radial Anti Sensory (Base 1st Digit)  30.1?C  ?Wrist    2.2 <3.1 36.3  Wrist Base 1st Digit 2.2 0.0    ?Right Radial Anti Sensory (Base 1st Digit)  30?C  ?Wrist    2.2 <3.1 41.4  Wrist Base 1st Digit 2.2 0.0    ?Left Ulnar Anti Sensory (5th Digit)  30.4?C  ?Wrist    3.7 <3.7 24.3 >15.0 Wrist 5th Digit 3.7 14.0 38 >38  ?Right Ulnar Anti Sensory (5th Digit)  29.9?C  ?Wrist    3.6 <3.7 24.0 >15.0 Wrist 5th Digit 3.6 14.0 39 >38  ? ?Motor Summary Table ? ? Stim Site NR Onset (ms) Norm Onset (ms) O-P Amp (mV) Norm O-P Amp Site1 Site2 Delta-0 (ms) Dist (cm) Vel (m/s) Norm Vel (m/s)  ?Left Median Motor (Abd Poll Brev)  30.6?C    martin-gruber  ?Wrist    *4.7 <4.2 *4.3 >5 Elbow Wrist 4.7 19.5 *41 >50  ?Elbow    9.4  3.8         ?Right Median Motor (Abd Poll Brev)  30.3?C  ?Wrist    *4.8 <4.2 *4.7 >5 Elbow Wrist 4.5 20.5 *46 >50  ?Elbow    9.3  4.5         ?Left Ulnar Motor (Abd Dig Min)  30.7?C  ?Wrist    3.1 <4.2 9.5 >3 B Elbow Wrist 3.3 18.5 56 >53  ?B Elbow    6.4  6.6  A Elbow B Elbow 1.3 9.0 69 >53  ?A Elbow    7.7  6.4         ?Right Ulnar Motor (Abd Dig Min)  30.4?C  ?Wrist    3.1 <4.2 3.0 >3 B Elbow Wrist 3.6 20.0 56 >53  ?B Elbow    6.7  7.8  A Elbow B Elbow 1.0 10.0 100 >53  ?A Elbow    7.7  6.6         ? ?EMG ? ? Side Muscle Nerve Root Ins Act Fibs Psw Amp Dur Poly Recrt Int Fraser Din Comment  ?Left 1stDorInt Ulnar C8-T1 Nml Nml Nml Nml Nml 0 Nml Nml   ?Left Abd Poll Brev Median C8-T1 Nml Nml Nml Nml Nml 0 Nml Nml   ?Left ExtDigCom   Nml Nml Nml Nml Nml 0 Nml Nml   ?Left Triceps Radial C6-7-8 Nml Nml Nml Nml Nml 0 Nml Nml   ?Left Deltoid Axillary C5-6 Nml Nml Nml Nml Nml 0 Nml Nml   ? ? ?Nerve Conduction Studies ?Anti Sensory Left/Right Comparison ? ? Stim Site L Lat (ms) R Lat (ms) L-R Lat (ms) L Amp (?V) R Amp (?V) L-R Amp (%) Site1 Site2 L Vel (m/s) R Vel (m/s) L-R Vel (m/s)  ?Median Acr Palm Anti Sensory (2nd Digit)  29.8?C  ?Wrist *4.5 *4.4 0.1 21.0 38.4 45.3  Wrist Palm     ?Palm  1.8   4.7        ?Radial Anti Sensory (Base 1st Digit)  30.1?C  ?Wrist 2.2 2.2 0.0 36.3 41.4 12.3 Wrist Base 1st Digit     ?Ulnar Anti Sensory (5th Digit)  30.4?C  ?Wrist 3.7 3.6 0.1 24.3 24.0 1.2 Wrist 5th Digit 38 39 1  ? ?Motor Left/Right Comparison ? ? Stim Site L Lat (ms) R Lat (ms) L-R Lat (ms) L Amp (mV) R Amp (mV) L-R Amp (%) Site1 Site2 L Vel (m/s) R Vel (m/s) L-R Vel (m/s)  ?Median Motor (Abd Poll Brev)  30.6?C    martin-gruber  ?Wrist *4.7 *4.8 0.1 *4.3 *4.7 8.5 Elbow Wrist *41 *46 5  ?Elbow 9.4 9.3 0.1 3.8 4.5 15.6       ?Ulnar Motor (Abd Dig Min)  30.7?C  ?Wrist 3.1 3.1 0.0 9.5 3.0 *68.4 B Elbow Wrist 56 56 0  ?B Elbow 6.4 6.7 0.3 6.6 7.8 15.4 A Elbow B Elbow 69 100 *31  ?A Elbow 7.7 7.7 0.0 6.4 6.6 3.0       ? ? ? ?Waveforms: ?    ? ?    ? ?    ? ?  ? ?

## 2021-03-22 NOTE — Progress Notes (Signed)
? ?Patrick Ortega - 81 y.o. male MRN [id number]  Date of birth: [birth year] ? ?Office Visit Note: ?Visit Date: 03/18/2021 ?PCP: [name], MD ?Referred by: Jessy Oto, MD ? ?Subjective: ?Chief Complaint  ?Patient presents with  ? Right Shoulder - Pain, Weakness  ? Left Shoulder - Pain, Weakness  ? ?HPI:  Patrick Ortega is a 81 y.o. male who comes in today at the request of Dr. Basil Dess for electrodiagnostic study of the Bilateral upper extremities.  Patient is Right hand dominant.  Interpreter is present today.  He reports 6 out of 10 pain particular in the bilateral shoulders and neck.  He has had cervical epidural injections and facet blocks with temporary relief.  He does get pain numbness and tingling in both hands.  He used to work for a [employer] and did a lot of pushing and lifting with his hands.  He does get some nocturnal complaints.  No prior electrodiagnostic studies.  No history of diabetes. ? ?ROS Otherwise per HPI. ? ?Assessment & Plan: ?Visit Diagnoses:  ?  ICD-10-CM   ?1. Paresthesia of skin  R20.2 NCV with EMG (electromyography)  ?  ?  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 ? ?There is no significant electrodiagnostic evidence of any other focal nerve entrapment, brachial plexopathy or cervical radiculopathy.  ? ?Recommendations: ?1.  Follow-up with referring physician. ?2.  Continue current management of symptoms. ?3.  Suggest surgical evaluation. ? ?Meds & Orders: No orders of the defined types were placed in this encounter. ?  ?Orders Placed This Encounter  ?Procedures  ? NCV with EMG (electromyography)  ?  ?Follow-up: Return in about 2 weeks (around 04/01/2021) for Basil Dess, MD.  ? ?Procedures: ?No procedures performed  ?EMG & NCV Findings: ?Evaluation of the left median motor and the right  median motor nerves showed prolonged distal onset latency (L4.7, R4.8 ms), reduced amplitude (L4.3, R4.7 mV), and decreased conduction velocity (Elbow-Wrist, L41, R46 m/s).  The left median (across palm) sensory nerve showed no response (Palm) and prolonged distal peak latency (4.5 ms).  The right median (across palm) sensory nerve showed prolonged distal peak latency (Wrist, 4.4 ms).  All remaining nerves (as indicated in the following tables) were within normal limits.  Left vs. Right side comparison data for the ulnar motor nerve indicates abnormal L-R amplitude difference (68.4 %) and abnormal L-R velocity difference (A Elbow-B Elbow, 31 m/s).  All remaining left vs. right side differences were within normal limits.   ? ?All examined muscles (as indicated in the following table) showed no evidence of electrical instability.   ? 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 ? ?There is no significant electrodiagnostic evidence of any other focal nerve entrapment, brachial plexopathy or cervical radiculopathy.  ? ?Recommendations: ?1.  Follow-up with referring physician. ?2.  Continue current management of symptoms. ?3.  Suggest surgical evaluation. ? ?___________________________ ?Laurence Spates FAAPMR ?Board Certified, Tax adviser of Physical Medicine and Rehabilitation ? ? ? ?Nerve Conduction Studies ?Anti Sensory Summary Table ? ? Stim Site NR Peak (ms) Norm Peak (ms) P-T Amp (?V) Norm P-T Amp Site1 Site2 Delta-P (ms) Dist (cm) Vel (m/s) Norm Vel (m/s)  ?Left Median Acr Palm Anti Sensory (  2nd Digit)  29.8?C  ?Wrist    *4.5 <3.6 21.0 >10 Wrist Palm  0.0    ?Palm *NR  <2.0          ?Right Median Acr Palm Anti Sensory (2nd Digit)  29.2?C  ?Wrist    *4.4 <3.6 38.4 >10 Wrist Palm 2.6 0.0    ?Palm    1.8 <2.0 4.7         ?Left Radial Anti Sensory  (Base 1st Digit)  30.1?C  ?Wrist    2.2 <3.1 36.3  Wrist Base 1st Digit 2.2 0.0    ?Right Radial Anti Sensory (Base 1st Digit)  30?C  ?Wrist    2.2 <3.1 41.4  Wrist Base 1st Digit 2.2 0.0    ?Left Ulnar Anti Sensory (5th Digit)  30.4?C  ?Wrist    3.7 <3.7 24.3 >15.0 Wrist 5th Digit 3.7 14.0 38 >38  ?Right Ulnar Anti Sensory (5th Digit)  29.9?C  ?Wrist    3.6 <3.7 24.0 >15.0 Wrist 5th Digit 3.6 14.0 39 >38  ? ?Motor Summary Table ? ? Stim Site NR Onset (ms) Norm Onset (ms) O-P Amp (mV) Norm O-P Amp Site1 Site2 Delta-0 (ms) Dist (cm) Vel (m/s) Norm Vel (m/s)  ?Left Median Motor (Abd Poll Brev)  30.6?C    martin-gruber  ?Wrist    *4.7 <4.2 *4.3 >5 Elbow Wrist 4.7 19.5 *41 >50  ?Elbow    9.4  3.8         ?Right Median Motor (Abd Poll Brev)  30.3?C  ?Wrist    *4.8 <4.2 *4.7 >5 Elbow Wrist 4.5 20.5 *46 >50  ?Elbow    9.3  4.5         ?Left Ulnar Motor (Abd Dig Min)  30.7?C  ?Wrist    3.1 <4.2 9.5 >3 B Elbow Wrist 3.3 18.5 56 >53  ?B Elbow    6.4  6.6  A Elbow B Elbow 1.3 9.0 69 >53  ?A Elbow    7.7  6.4         ?Right Ulnar Motor (Abd Dig Min)  30.4?C  ?Wrist    3.1 <4.2 3.0 >3 B Elbow Wrist 3.6 20.0 56 >53  ?B Elbow    6.7  7.8  A Elbow B Elbow 1.0 10.0 100 >53  ?A Elbow    7.7  6.6         ? ?EMG ? ? Side Muscle Nerve Root Ins Act Fibs Psw Amp Dur Poly Recrt Int Fraser Din Comment  ?Left 1stDorInt Ulnar C8-T1 Nml Nml Nml Nml Nml 0 Nml Nml   ?Left Abd Poll Brev Median C8-T1 Nml Nml Nml Nml Nml 0 Nml Nml   ?Left ExtDigCom   Nml Nml Nml Nml Nml 0 Nml Nml   ?Left Triceps Radial C6-7-8 Nml Nml Nml Nml Nml 0 Nml Nml   ?Left Deltoid Axillary C5-6 Nml Nml Nml Nml Nml 0 Nml Nml   ? ? ?Nerve Conduction Studies ?Anti Sensory Left/Right Comparison ? ? Stim Site L Lat (ms) R Lat (ms) L-R Lat (ms) L Amp (?V) R Amp (?V) L-R Amp (%) Site1 Site2 L Vel (m/s) R Vel (m/s) L-R Vel (m/s)  ?Median Acr Palm Anti Sensory (2nd Digit)  29.8?C  ?Wrist *4.5 *4.4 0.1 21.0 38.4 45.3 Wrist Palm     ?Palm  1.8   4.7        ?Radial Anti Sensory (Base 1st  Digit)  30.1?C  ?Wrist 2.2 2.2 0.0 36.3 41.4 12.3 Wrist  Base 1st Digit     ?Ulnar Anti Sensory (5th Digit)  30.4?C  ?Wrist 3.7 3.6 0.1 24.3 24.0 1.2 Wrist 5th Digit 38 39 1  ? ?Motor Left/Right Comparison ? ? Stim Site L Lat (ms) R Lat (ms) L-R Lat (ms) L Amp (mV) R Amp (mV) L-R Amp (%) Site1 Site2 L Vel (m/s) R Vel (m/s) L-R Vel (m/s)  ?Median Motor (Abd Poll Brev)  30.6?C    martin-gruber  ?Wrist *4.7 *4.8 0.1 *4.3 *4.7 8.5 Elbow Wrist *41 *46 5  ?Elbow 9.4 9.3 0.1 3.8 4.5 15.6       ?Ulnar Motor (Abd Dig Min)  30.7?C  ?Wrist 3.1 3.1 0.0 9.5 3.0 *68.4 B Elbow Wrist 56 56 0  ?B Elbow 6.4 6.7 0.3 6.6 7.8 15.4 A Elbow B Elbow 69 100 *31  ?A Elbow 7.7 7.7 0.0 6.4 6.6 3.0       ? ? ? ?Waveforms: ?    ? ?    ? ?    ? ?  ?  ? ?Clinical History: ?No specialty comments available.  ? ? ? ?Objective:  VS:  HT:    WT:   BMI:     BP:   HR: bpm  TEMP: ( )  RESP:  ?Physical Exam ?Musculoskeletal:     ?   General: No tenderness.  ?   Comments: Inspection reveals no atrophy of the bilateral APB or FDI or hand intrinsics. There is no swelling, color changes, allodynia or dystrophic changes. There is 5 out of 5 strength in the bilateral wrist extension, finger abduction and long finger flexion. There is intact sensation to light touch in all dermatomal and peripheral nerve distributions. There is a negative Hoffmann's test bilaterally.  ?Skin: ?   General: Skin is warm and dry.  ?   Findings: No erythema or rash.  ?Neurological:  ?   General: No focal deficit present.  ?   Mental Status: He is alert and oriented to person, place, and time.  ?   Sensory: No sensory deficit.  ?   Motor: No weakness or abnormal muscle tone.  ?   Coordination: Coordination normal.  ?   Gait: Gait normal.  ?Psychiatric:     ?   Mood and Affect: Mood normal.     ?   Behavior: Behavior normal.     ?   Thought Content: Thought content normal.  ?  ? ?Imaging: ?No results found. ?

## 2021-04-07 ENCOUNTER — Other Ambulatory Visit: Payer: Self-pay

## 2021-04-14 ENCOUNTER — Ambulatory Visit: Payer: Medicare Other | Admitting: Critical Care Medicine

## 2021-04-16 ENCOUNTER — Encounter: Payer: Self-pay | Admitting: Specialist

## 2021-04-16 ENCOUNTER — Ambulatory Visit (INDEPENDENT_AMBULATORY_CARE_PROVIDER_SITE_OTHER): Payer: Medicare Other | Admitting: Specialist

## 2021-04-16 VITALS — BP 126/71 | HR 84 | Ht 60.0 in | Wt 170.0 lb

## 2021-04-16 DIAGNOSIS — R29898 Other symptoms and signs involving the musculoskeletal system: Secondary | ICD-10-CM | POA: Diagnosis not present

## 2021-04-16 DIAGNOSIS — M12812 Other specific arthropathies, not elsewhere classified, left shoulder: Secondary | ICD-10-CM | POA: Diagnosis not present

## 2021-04-16 DIAGNOSIS — R200 Anesthesia of skin: Secondary | ICD-10-CM

## 2021-04-16 DIAGNOSIS — M12811 Other specific arthropathies, not elsewhere classified, right shoulder: Secondary | ICD-10-CM

## 2021-04-16 DIAGNOSIS — R202 Paresthesia of skin: Secondary | ICD-10-CM

## 2021-04-16 NOTE — Patient Instructions (Signed)
S?ndrome del t?nel carpiano ?Carpal Tunnel Syndrome ?El s?ndrome del t?nel carpiano es una afecci?n que causa dolor, debilidad y adormecimiento en la mano y los dedos. El adormecimiento es cuando no siente una zona del cuerpo. El t?nel carpiano es un ?rea estrecha que se encuentra en el lado palmar de la mu?eca. Los movimientos repetidos de la mu?eca o determinadas enfermedades pueden causar hinchaz?n en el t?nel. Esta hinchaz?n puede comprimir el nervio principal de la mu?eca. Este nervio se llama ?nervio mediano?Marland Kitchen ??Cu?les son las causas? ?Esta afecci?n puede ser causada por lo siguiente: ?Mover la mano y la mu?eca una y otra vez mientras realiza una tarea. ?Lesi?n en la mu?eca. ?Artritis. ?Un saco lleno de l?quido (quiste) o un crecimiento anormal (tumor) en el t?nel carpiano. ?Acumulaci?n de l?quido durante el embarazo. ?Uso de herramientas que vibran. ?Algunas veces, la causa no se conoce. ??Qu? incrementa el riesgo? ?Los siguientes factores pueden hacer que sea m?s propenso a Best boy esta afecci?n: ?Tener un trabajo en el que deba hacer estas cosas: ?Mover la mano una y [place]. ?Trabajar con herramientas que vibran, como taladros o lijadoras. ?Ser mujer. ?Tener diabetes, obesidad, problemas de tiroides o insuficiencia renal. ??Cu?les son los signos o s?ntomas? ?Los s?ntomas de esta afecci?n incluyen: ?Sensaci?n de hormigueo en los dedos. ?Hormigueo o p?rdida de la sensibilidad de Insurance risk surveyor. ?Dolor en todo el brazo. Este dolor puede empeorar al flexionar la mu?eca y el codo [place]. ?Dolor en la mu?eca que sube por el brazo hasta el hombro. ?Dolor que baja hasta la palma de la mano o los dedos. ?Debilidad en las manos. Puede resultarle dif?cil tomar y Licensed conveyancer. ?Es posible que se sienta peor por la noche. ??C?mo se trata? ?El tratamiento de esta afecci?n puede incluir: ?Cambios en el estilo de vida. Se le pedir? que deje o cambie la actividad que caus? el problema. ?Hacer ejercicios y  actividades para que los [place], los m?sculos y los tendones se vuelvan m?s fuertes (fisioterapia). ?Aprender a Control and instrumentation engineer nuevamente (terapia ocupacional). ?Medicamentos para Conservation officer, historic buildings y la hinchaz?n. Es posible que le apliquen inyecciones en la mu?eca. ?Una f?rula o un dispositivo ortop?dico para la mu?eca. ?Cirug?a. ?Siga estas instrucciones en su casa: ?Si tiene una f?rula o un dispositivo ortop?dico: ?Use la f?rula o el dispositivo ortop?dico como se lo haya indicado el m?dico. Qu?teselos solamente como se lo haya indicado el m?dico. ?Afloje la f?rula si los dedos: ?Hormiguean. ?Se adormecen. ?Se tornan fr?os y de [employer]. ?Mantenga la f?rula o el dispositivo ortop?dico limpios. ?Si la f?rula o el dispositivo ortop?dico no son impermeables: ?No deje que se mojen. ?C?bralos con un envoltorio herm?tico cuando tome un ba?o de inmersi?n o una ducha. ?Control del dolor, la rigidez y la hinchaz?n ?Si se lo indican, aplique hielo sobre la zona dolorida: ?Si tiene un dispositivo ortop?dico o una f?rula desmontable, qu?teselos como se lo haya indicado el m?dico. ?Ponga el hielo en una bolsa pl?stica. ?Coloque una [employer] piel y Therapist, nutritional. ?Coloque el hielo durante 20 minutos, 2 a 3 veces al d?a. No se quede dormido con la bolsa de hielo [employer]. ?Retire el hielo si la piel se le pone de color rojo brillante. Esto es [employer]. Si no puede sentir dolor, calor o fr?o, tiene un mayor riesgo de que se da?e la zona. ?Mueva los dedos con frecuencia para reducir la rigidez y la hinchaz?n. ?Instrucciones generales ?Tome los medicamentos de venta libre y los recetados solamente como  se lo haya indicado el m?dico. ?Descanse la mu?eca de cualquier actividad que le cause dolor. Si es necesario, hable con su jefe en el [employer] cambios que pueden ayudar a la curaci?n de la mu?eca. ?Haga los ejercicios que le hayan indicado el m?dico, el fisioterapeuta o el terapeuta ocupacional. ?Cumpla con todas las  visitas de seguimiento. ?Comun?quese con un m?dico si: ?Aparecen nuevos s?ntomas. ?Los medicamentos no Forensic psychologist. ?Sus s?ntomas empeoran. ?Solicite ayuda de inmediato si: ?Tiene adormecimiento u hormigueo muy intensos en la mu?eca o la mano. ?Resumen ?El s?ndrome del t?nel carpiano es una afecci?n que causa dolor en la mano y en el brazo. ?Suele deberse a movimientos repetidos de Software engineer. ?Este problema se trata mediante cambios en el estilo de vida y medicamentos. La cirug?a puede ser [employer] graves. ?Siga las instrucciones del m?dico sobre el uso de una f?rula, el reposo de la [place], la asistencia a las consultas de seguimiento y Solicitor para pedir ayuda. ?Esta informaci?n no tiene Marine scientist el consejo del m?dico. Aseg?rese de hacerle al m?dico cualquier pregunta que tenga. ?Document Revised: 06/08/2019 Document Reviewed: 06/08/2019 ?Elsevier Patient Education ? [place]. ? ?

## 2021-04-16 NOTE — Progress Notes (Signed)
? ?Office Visit Note ?  ?Patient: Patrick Ortega           ?Date of Birth: 1940/05/17           ?MRN: [id number] ?Visit Date: 04/16/2021 ?             ?Requested by: [name], MD ?[address] ?[address] ?[place],  [place] [address] ?PCP: [name], MD ? ? ?Assessment & Plan: ?Visit Diagnoses:  ?1. Bilateral numbness and tingling of arms and legs   ?2. Rotator cuff arthropathy of both shoulders   ?3. Weakness of shoulder   ? ? ?Plan: Carpal Tunnel Syndrome ? ?Carpal tunnel syndrome is a condition that causes pain in your hand and arm. The carpal tunnel is a narrow area located on the palm side of your wrist. Repeated wrist motion or certain diseases may cause swelling within the tunnel. This swelling pinches the main nerve in the wrist (median nerve). ?What are the causes? ?This condition may be caused by: ?Repeated wrist motions. ?Wrist injuries. ?Arthritis. ?A cyst or tumor in the carpal tunnel. ?Fluid buildup during pregnancy. ?Sometimes the cause of this condition is not known. ?What increases the risk? ?This condition is more likely to develop in: ?People who have jobs that cause them to repeatedly move their wrists in the same motion, such as Art gallery manager. ?Women. ?People with certain conditions, such as: ?Diabetes. ?Obesity. ?An underactive thyroid (hypothyroidism). ?Kidney failure. ?What are the signs or symptoms? ?Symptoms of this condition include: ?A tingling feeling in your fingers, especially in your thumb, index, and middle fingers. ?Tingling or numbness in your hand. ?An aching feeling in your entire arm, especially when your wrist and elbow are bent for long periods of time. ?Wrist pain that goes up your arm to your shoulder. ?Pain that goes down into your palm or fingers. ?A weak feeling in your hands. You may have trouble grabbing and holding items. ?Your symptoms may feel worse during the night. ?How is this diagnosed? ?This condition is diagnosed with a medical history and  physical exam. You may also have tests, including: ?An electromyogram (EMG). This test measures electrical signals sent by your nerves into the muscles. ?X-rays. ?How is this treated? ?Treatment for this condition includes: ?Lifestyle changes. It is important to stop doing or modify the activity that caused your condition. ?Physical or occupational therapy. ?Medicines for pain and inflammation. This may include medicine that is injected into your wrist. ?A wrist splint. ?Surgery. ?Follow these instructions at home: ?If you have a splint:  ?Wear it as told by your health care provider. Remove it only as told by your health care provider. ?Loosen the splint if your fingers become numb and tingle, or if they turn cold and blue. ?Keep the splint clean and dry. ?General instructions  ?Take over-the-counter and prescription medicines only as told by your health care provider. ?Rest your wrist from any activity that may be causing your pain. If your condition is work related, talk to your employer about changes that can be made, such as getting a wrist pad to use while typing. ?If directed, apply ice to the painful area: ?Put ice in a plastic bag. ?Place a towel between your skin and the bag. ?Leave the ice on for 20 minutes, 2-3 times per day. ?Keep all follow-up visits as told by your health care provider. This is important. ?Do any exercises as told by your health care provider, physical therapist, or occupational therapist. ?Contact a  health care provider if: ?You have new symptoms. ?Your pain is not controlled with medicines. ?Your symptoms get worse. ?This information is not intended to replace advice given to you by your health care provider. Make sure you discuss any questions you have with your health care provider. ?Document Released: 12/19/1999 Document Revised: 05/01/2015 Document Reviewed: 09/01/2016 ?Elsevier Interactive Patient Education ? 2017 [place].  ?If he develops pain, numbness or tingling that  is worsening please contact us and have a follow up appointment.  ?Follow-Up Instructions: No follow-ups on file.  ? ?Orders:  ?No orders of the defined types were placed in this encounter. ? ?No orders of the defined types were placed in this encounter. ? ? ? ? Procedures: ?No procedures performed ? ? ?Clinical Data: ?No additional findings. ? ? ?Subjective: ?No chief complaint on file. ? ? ?81 year old right handed male with history of shoulder rotator cuff tear right with 2 injections he is not having any pain. Neck is pain free. ?EMG/NCV with bilateral CTS but he is totally assymptomatic. Dr. Tempie Donning assessed and does not recommend intervention. ? ? ?Review of Systems  ?Constitutional: Negative.   ?HENT: Negative.    ?Eyes: Negative.   ?Respiratory: Negative.    ?Cardiovascular: Negative.   ?Gastrointestinal: Negative.   ?Endocrine: Negative.   ?Genitourinary: Negative.   ?Musculoskeletal: Negative.   ?Skin: Negative.   ?Allergic/Immunologic: Negative.   ?Neurological: Negative.   ?Hematological: Negative.   ?Psychiatric/Behavioral: Negative.    ? ? ?Objective: ?Vital Signs: BP 126/71 (BP Location: Left Arm, Patient Position: Sitting, Cuff Size: Large)   Pulse 84  ? ?Physical Exam ?Constitutional:   ?   Appearance: He is well-developed.  ?HENT:  ?   Head: Normocephalic and atraumatic.  ?Eyes:  ?   Pupils: Pupils are equal, round, and reactive to light.  ?Pulmonary:  ?   Effort: Pulmonary effort is normal.  ?   Breath sounds: Normal breath sounds.  ?Abdominal:  ?   General: Bowel sounds are normal.  ?   Palpations: Abdomen is soft.  ?Musculoskeletal:     ?   General: Normal range of motion.  ?   Cervical back: Normal range of motion and neck supple.  ?Skin: ?   General: Skin is warm and dry.  ?Neurological:  ?   Mental Status: He is alert and oriented to person, place, and time.  ?Psychiatric:     ?   Behavior: Behavior normal.     ?   Thought Content: Thought content normal.     ?   Judgment: Judgment normal.   ? ?Right Hand Exam  ? ?Tests  ?Phalen?s Sign: negative ?Tinel's sign (median nerve): negative ? ? ?Left Hand Exam  ? ?Tests  ?Phalen?s Sign: negative ?Tinel's sign (median nerve): negative ? ? ? ?Specialty Comments:  ?No specialty comments available. ? ?Imaging: ?No results found. ? ? ?PMFS History: ?Patient Active Problem List  ? Diagnosis Date Noted  ? Vitamin D deficiency 07/10/2020  ? Elevated PSA 12/11/2019  ? Urinary frequency 12/10/2019  ? Cervical radiculopathy 12/10/2019  ? Chronic right shoulder pain 12/10/2019  ? Essential hypertension 05/24/2014  ? 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 ?Past Medical History:  ?Diagnosis Date  ? GERD (gastroesophageal reflux disease)   ? Hypertension   ?  ?Family History  ?Problem Relation Age of Onset  ? Ulcers Mother   ?  ?Past Surgical History:  ?Procedure Laterality Date  ? NO PAST SURGERIES    ? ?  Social History  ? ?Occupational History  ? Not on file  ?Tobacco Use  ? Smoking status: Former  ?  Packs/day: 0.25  ?  Years: 0.50  ?  Pack years: 0.13  ?  Types: Cigarettes  ? Smokeless tobacco: Never  ?Vaping Use  ? Vaping Use: Never used  ?Substance and Sexual Activity  ? Alcohol use: Not Currently  ? Drug use: Not on file  ? Sexual activity: Yes  ? ? ? ? ? ? ?

## 2021-05-05 ENCOUNTER — Other Ambulatory Visit: Payer: Self-pay

## 2021-05-18 ENCOUNTER — Other Ambulatory Visit: Payer: Self-pay

## 2021-05-24 NOTE — Progress Notes (Signed)
Established Patient Office Visit  Subjective:  Patient ID: Patrick Ortega, male    DOB: 26-Aug-1940  Age: 81 y.o. MRN: [id number]  CC:  Dysuria , penile pain  HPI 03/10/21 Patrick Ortega presents for primary care follow-up.  This patient was seen with [facility] interpreter audio Spanish andy [phone] Patient continues to have neck and shoulder pain.  He is being evaluated extensively by orthopedics and has follow-up appointments over the next 2 weeks for nerve conduction study and shoulder evaluations.  He continues to complain of the pain today and I reminded him and his son who is with him today who his name is Patrick Ortega these appointments are available and he should follow-up on them.  Another issue is he complains of irritation at the end of his penis burning on urination and difficulty urinating.  He been seen by urology previously was given high-dose Flomax and was told to follow-up expectantly on his PSA that was elevated at 7.0 he does not have follow-up appointments with urology currently.  He was last seen by urology December 2021.  05/24/21  Patient is seen today as a work in visit and the visit was assisted by [place] video interpreter [phone] Since the last visit patient has been seen by urology they performed a part prostate biopsy which was negative for malignancy he has been maintained on tamsulosin  Today though the patient comes in with burning sensation at the tip of his penis and on and around the uncircumcised area of the penis.  He has some blood intermittently from the head of the penis and sometimes in the urine.  He has some burning on urination. The patient also complains of neck discomfort but he underwent EMG nerve conduction study with orthopedics which did not show evidence of nerve root compression on the cervical spine.  He does not need injections in the neck area.  He did have shoulder work performed. Past Medical History:  Diagnosis Date   GERD (gastroesophageal  reflux disease)    Hypertension     Past Surgical History:  Procedure Laterality Date   NO PAST SURGERIES      Family History  Problem Relation Age of Onset   Ulcers Mother     Social History   Socioeconomic History   Marital status: Married    Spouse name: Not on file   Number of children: Not on file   Years of education: Not on file   Highest education level: Not on file  Occupational History   Not on file  Tobacco Use   Smoking status: Former    Packs/day: 0.25    Years: 0.50    Pack years: 0.13    Types: Cigarettes   Smokeless tobacco: Never  Vaping Use   Vaping Use: Never used  Substance and Sexual Activity   Alcohol use: Not Currently   Drug use: Not on file   Sexual activity: Yes  Other Topics Concern   Not on file  Social History Narrative   Not on file   Social Determinants of Health   Financial Resource Strain: Not on file  Food Insecurity: Not on file  Transportation Needs: Not on file  Physical Activity: Not on file  Stress: Not on file  Social Connections: Not on file  Intimate Partner Violence: Not on file    Outpatient Medications Prior to Visit  Medication Sig Dispense Refill   amLODipine (NORVASC) 10 MG tablet Take 1 tablet (10 mg total) by mouth daily. 90 tablet 2  chlorthalidone (HYGROTON) 25 MG tablet TAKE 1 TABLET (25 MG TOTAL) BY MOUTH DAILY. 90 tablet 2   diclofenac (VOLTAREN) 50 MG EC tablet Take 1 tablet (50 mg total) by mouth daily with breakfast. 30 tablet 2   gabapentin (NEURONTIN) 300 MG capsule Take 1 capsule (300 mg total) by mouth 3 (three) times daily. For numbness in hands 90 capsule 3   tamsulosin (FLOMAX) 0.4 MG CAPS capsule TAKE 2 CAPSULES (0.8 MG TOTAL) BY MOUTH DAILY. 60 capsule 3   No facility-administered medications prior to visit.    No Known Allergies  ROS Review of Systems  Constitutional: Negative.   HENT: Negative.  Negative for ear pain, postnasal drip, rhinorrhea, sinus pressure, sore throat,  trouble swallowing and voice change.   Eyes: Negative.   Respiratory: Negative.  Negative for apnea, cough, choking, chest tightness, shortness of breath, wheezing and stridor.   Cardiovascular: Negative.  Negative for chest pain, palpitations and leg swelling.  Gastrointestinal: Negative.  Negative for abdominal distention, abdominal pain, nausea and vomiting.  Genitourinary:  Positive for difficulty urinating, dysuria, frequency, hematuria, penile pain, penile swelling and urgency. Negative for penile discharge, scrotal swelling and testicular pain.  Musculoskeletal:  Positive for neck pain. Negative for arthralgias and myalgias.  Skin: Negative.  Negative for rash.  Allergic/Immunologic: Negative.  Negative for environmental allergies and food allergies.  Neurological: Negative.  Negative for dizziness, syncope, weakness and headaches.  Hematological: Negative.  Negative for adenopathy. Does not bruise/bleed easily.  Psychiatric/Behavioral: Negative.  Negative for agitation and sleep disturbance. The patient is not nervous/anxious.      Objective:    Physical Exam Vitals reviewed.  Constitutional:      Appearance: Normal appearance. He is well-developed. He is not diaphoretic.  HENT:     Head: Normocephalic and atraumatic.     Nose: No nasal deformity, septal deviation, mucosal edema or rhinorrhea.     Right Sinus: No maxillary sinus tenderness or frontal sinus tenderness.     Left Sinus: No maxillary sinus tenderness or frontal sinus tenderness.     Mouth/Throat:     Pharynx: No oropharyngeal exudate.  Eyes:     General: No scleral icterus.    Conjunctiva/sclera: Conjunctivae normal.     Pupils: Pupils are equal, round, and reactive to light.  Neck:     Thyroid: No thyromegaly.     Vascular: No carotid bruit or JVD.     Trachea: Trachea normal. No tracheal tenderness or tracheal deviation.  Cardiovascular:     Rate and Rhythm: Normal rate and regular rhythm.     Chest Wall:  PMI is not displaced.     Pulses: Normal pulses. No decreased pulses.     Heart sounds: Normal heart sounds, S1 normal and S2 normal. Heart sounds not distant. No murmur heard. No systolic murmur is present.  No diastolic murmur is present.    No friction rub. No gallop. No S3 or S4 sounds.  Pulmonary:     Effort: No tachypnea, accessory muscle usage or respiratory distress.     Breath sounds: No stridor. No decreased breath sounds, wheezing, rhonchi or rales.  Chest:     Chest wall: No tenderness.  Abdominal:     General: Bowel sounds are normal. There is no distension.     Palpations: Abdomen is soft. Abdomen is not rigid.     Tenderness: There is no abdominal tenderness. There is no guarding or rebound.  Genitourinary:    Testes: Normal.  Rectum: Normal.     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 No edema, erythema or rigidity. No muscular tenderness. Normal range of motion.     Comments: Decreased rom of both shoulders.    Lymphadenopathy:     Head:     Right side of head: No submental or submandibular adenopathy.     Left side of head: No submental or submandibular adenopathy.     Cervical: No cervical adenopathy.  Skin:    General: Skin is warm and dry.     Coloration: Skin is not pale.     Findings: No rash.     Nails: There is no clubbing.  Neurological:     General: No focal deficit present.     Mental Status: He is alert and oriented to person, place, and time.     Sensory: No sensory deficit.  Psychiatric:        Mood and Affect: Mood normal.        Speech: Speech normal.        Behavior: Behavior normal.        Thought Content: Thought content normal.        Judgment: Judgment normal.    BP 138/65   Pulse 87   Wt 161 lb 9.6 oz (73.3 kg)   SpO2 95%   BMI 31.56 kg/m  Wt Readings from Last 3 Encounters:   05/25/21 161 lb 9.6 oz (73.3 kg)  04/16/21 170 lb (77.1 kg)  03/10/21 160 lb 6.4 oz (72.8 kg)     Health Maintenance Due  Topic Date Due   COVID-19 Vaccine (4 - Booster for Pfizer series) 03/11/2020    There are no preventive care reminders to display for this patient.  Lab Results  Component Value Date   TSH 2.080 04/25/2019   Lab Results  Component Value Date   WBC 12.0 (H) 03/10/2021   HGB 15.0 03/10/2021   HCT 45.4 03/10/2021   MCV 85 03/10/2021   PLT 412 03/10/2021   Lab Results  Component Value Date   NA 140 03/10/2021   K 4.5 03/10/2021   CO2 27 03/10/2021   GLUCOSE 95 03/10/2021   BUN 16 03/10/2021   CREATININE 1.01 03/10/2021   BILITOT 0.5 03/10/2021   ALKPHOS 119 03/10/2021   AST 25 03/10/2021   ALT 18 03/10/2021   PROT 8.6 (H) 03/10/2021   ALBUMIN 5.0 (H) 03/10/2021   CALCIUM 10.5 (H) 03/10/2021   EGFR 75 03/10/2021   Lab Results  Component Value Date   CHOL 195 05/09/2019   Lab Results  Component Value Date   HDL 64 05/09/2019   Lab Results  Component Value Date   LDLCALC 112 (H) 05/09/2019   Lab Results  Component Value Date   TRIG 110 05/09/2019   Lab Results  Component Value Date   CHOLHDL 3.0 05/09/2019   No results found for: HGBA1C    Assessment & Plan:   Problem List Items Addressed This Visit       Cardiovascular and Mediastinum   Essential hypertension    Well-controlled this time no changes made         Nervous and Auditory   RESOLVED: Cervical radiculopathy    Per orthopedics EMG nerve conduction study normal the patient does not have cervical radiculopathy  Genitourinary   Gross hematuria    Gross hematuria I suspect is more from topical penile lesion       Relevant Orders   Urinalysis   POCT URINALYSIS DIP (CLINITEK) (Completed)   Urine Culture   PSA   CBC with Differential/Platelet     Other   Urinary frequency    Increased urination penile head inflammation and potential urinary  tract infection  We will give empiric topical and oral acyclovir  We will check urinalysis and urine culture  Recheck PSA       Chronic right shoulder pain    Improved with therapy from orthopedics       Elevated PSA    Prostate biopsies negative we will monitor   continue PSA monitoring       Dysuria - Primary   Relevant Orders   Urinalysis   POCT URINALYSIS DIP (CLINITEK) (Completed)   Urine Culture   PSA   CBC with Differential/Platelet   Meds ordered this encounter  Medications   acyclovir (ZOVIRAX) 400 MG tablet    Sig: Take 1 tablet (400 mg total) by mouth 5 (five) times daily for 5 days.    Dispense:  25 tablet    Refill:  0   acyclovir ointment (ZOVIRAX) 5 %    Sig: Apply 1 application. topically 4 (four) times daily. Apply to affected areas to penis    Dispense:  30 g    Refill:  0  38 minutes spent extra time needed because of language barrier multiple systems assessed  Follow-up: Return in about 2 months (around 07/25/2021).    Asencion Noble, MD

## 2021-05-25 ENCOUNTER — Ambulatory Visit: Payer: Medicare Other | Attending: Critical Care Medicine | Admitting: Critical Care Medicine

## 2021-05-25 ENCOUNTER — Encounter: Payer: Self-pay | Admitting: Critical Care Medicine

## 2021-05-25 ENCOUNTER — Other Ambulatory Visit: Payer: Self-pay

## 2021-05-25 VITALS — BP 138/65 | HR 87 | Wt 161.6 lb

## 2021-05-25 DIAGNOSIS — G8929 Other chronic pain: Secondary | ICD-10-CM

## 2021-05-25 DIAGNOSIS — Z791 Long term (current) use of non-steroidal anti-inflammatories (NSAID): Secondary | ICD-10-CM | POA: Insufficient documentation

## 2021-05-25 DIAGNOSIS — R350 Frequency of micturition: Secondary | ICD-10-CM

## 2021-05-25 DIAGNOSIS — M25511 Pain in right shoulder: Secondary | ICD-10-CM | POA: Diagnosis not present

## 2021-05-25 DIAGNOSIS — Z79899 Other long term (current) drug therapy: Secondary | ICD-10-CM | POA: Diagnosis not present

## 2021-05-25 DIAGNOSIS — M5412 Radiculopathy, cervical region: Secondary | ICD-10-CM | POA: Diagnosis not present

## 2021-05-25 DIAGNOSIS — N4889 Other specified disorders of penis: Secondary | ICD-10-CM | POA: Diagnosis not present

## 2021-05-25 DIAGNOSIS — R310 Gross hematuria: Secondary | ICD-10-CM

## 2021-05-25 DIAGNOSIS — Z87891 Personal history of nicotine dependence: Secondary | ICD-10-CM | POA: Insufficient documentation

## 2021-05-25 DIAGNOSIS — R972 Elevated prostate specific antigen [PSA]: Secondary | ICD-10-CM | POA: Diagnosis not present

## 2021-05-25 DIAGNOSIS — R300 Dysuria: Secondary | ICD-10-CM | POA: Diagnosis not present

## 2021-05-25 DIAGNOSIS — I10 Essential (primary) hypertension: Secondary | ICD-10-CM

## 2021-05-25 LAB — POCT URINALYSIS DIP (CLINITEK)
Bilirubin, UA: NEGATIVE
Blood, UA: NEGATIVE
Glucose, UA: NEGATIVE mg/dL
Leukocytes, UA: NEGATIVE
Nitrite, UA: NEGATIVE
POC PROTEIN,UA: NEGATIVE
Spec Grav, UA: 1.025 (ref 1.010–1.025)
Urobilinogen, UA: 0.2 E.U./dL
pH, UA: 6.5 (ref 5.0–8.0)

## 2021-05-25 MED ORDER — ACYCLOVIR 400 MG PO TABS
400.0000 mg | ORAL_TABLET | Freq: Every day | ORAL | 0 refills | Status: AC
  Filled 2021-05-25: qty 25, 5d supply, fill #0

## 2021-05-25 MED ORDER — ACYCLOVIR 5 % EX OINT
1.0000 "application " | TOPICAL_OINTMENT | Freq: Four times a day (QID) | CUTANEOUS | 0 refills | Status: DC
  Filled 2021-05-25: qty 30, fill #0
  Filled 2021-05-27: qty 15, 15d supply, fill #0

## 2021-05-25 NOTE — Patient Instructions (Signed)
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  Se obtendr un cultivo de [place]  Los laboratorios de hoy incluyen hemograma y Biochemist, clinical de PSA  Ningn otro cambio en los medicamentos  [employer]

## 2021-05-25 NOTE — Assessment & Plan Note (Signed)
Increased urination penile head inflammation and potential urinary tract infection  We will give empiric topical and oral acyclovir  We will check urinalysis and urine culture  Recheck PSA

## 2021-05-25 NOTE — Assessment & Plan Note (Signed)
Prostate biopsies negative we will monitor   continue PSA monitoring

## 2021-05-25 NOTE — Assessment & Plan Note (Signed)
Improved with therapy from orthopedics

## 2021-05-25 NOTE — Assessment & Plan Note (Signed)
Well-controlled this time no changes made

## 2021-05-25 NOTE — Assessment & Plan Note (Signed)
Gross hematuria I suspect is more from topical penile lesion

## 2021-05-25 NOTE — Assessment & Plan Note (Signed)
Per orthopedics EMG nerve conduction study normal the patient does not have cervical radiculopathy

## 2021-05-26 ENCOUNTER — Other Ambulatory Visit: Payer: Self-pay

## 2021-05-26 LAB — CBC WITH DIFFERENTIAL/PLATELET
Basophils Absolute: 0.1 10*3/uL (ref 0.0–0.2)
Basos: 1 %
EOS (ABSOLUTE): 0.1 10*3/uL (ref 0.0–0.4)
Eos: 1 %
Hematocrit: 44.3 % (ref 37.5–51.0)
Hemoglobin: 15.1 g/dL (ref 13.0–17.7)
Immature Grans (Abs): 0 10*3/uL (ref 0.0–0.1)
Immature Granulocytes: 0 %
Lymphocytes Absolute: 3.9 10*3/uL — ABNORMAL HIGH (ref 0.7–3.1)
Lymphs: 38 %
MCH: 29.2 pg (ref 26.6–33.0)
MCHC: 34.1 g/dL (ref 31.5–35.7)
MCV: 86 fL (ref 79–97)
Monocytes Absolute: 1 10*3/uL — ABNORMAL HIGH (ref 0.1–0.9)
Monocytes: 10 %
Neutrophils Absolute: 5.3 10*3/uL (ref 1.4–7.0)
Neutrophils: 50 %
Platelets: 463 10*3/uL — ABNORMAL HIGH (ref 150–450)
RBC: 5.17 x10E6/uL (ref 4.14–5.80)
RDW: 14.1 % (ref 11.6–15.4)
WBC: 10.4 10*3/uL (ref 3.4–10.8)

## 2021-05-26 LAB — URINALYSIS
Bilirubin, UA: NEGATIVE
Glucose, UA: NEGATIVE
Leukocytes,UA: NEGATIVE
Nitrite, UA: NEGATIVE
RBC, UA: NEGATIVE
Specific Gravity, UA: 1.022 (ref 1.005–1.030)
Urobilinogen, Ur: 0.2 mg/dL (ref 0.2–1.0)
pH, UA: 6 (ref 5.0–7.5)

## 2021-05-26 LAB — PSA: Prostate Specific Ag, Serum: 8.2 ng/mL — ABNORMAL HIGH (ref 0.0–4.0)

## 2021-05-27 ENCOUNTER — Telehealth: Payer: Self-pay

## 2021-05-27 ENCOUNTER — Other Ambulatory Visit: Payer: Self-pay

## 2021-05-27 LAB — URINE CULTURE

## 2021-05-27 NOTE — Telephone Encounter (Signed)
-----   Message from [name], MD sent at 05/27/2021  5:42 AM EDT ----- Let pt know there is no urine infection   his  pain is from the penile lesion herpes which we are treating, his psa is lower, his blood counts are normal  : call the son for this information

## 2021-05-27 NOTE — Telephone Encounter (Signed)
Pt was called and vm was left, Information has been sent to nurse pool.   

## 2021-06-04 ENCOUNTER — Other Ambulatory Visit: Payer: Self-pay

## 2021-06-04 ENCOUNTER — Other Ambulatory Visit: Payer: Self-pay | Admitting: Critical Care Medicine

## 2021-06-04 MED ORDER — DICLOFENAC SODIUM 50 MG PO TBEC
50.0000 mg | DELAYED_RELEASE_TABLET | Freq: Every day | ORAL | 2 refills | Status: DC
  Filled 2021-06-04: qty 30, 30d supply, fill #0
  Filled 2021-07-13: qty 30, 30d supply, fill #1

## 2021-06-05 ENCOUNTER — Other Ambulatory Visit: Payer: Self-pay

## 2021-06-08 ENCOUNTER — Telehealth: Payer: Self-pay | Admitting: Critical Care Medicine

## 2021-06-08 ENCOUNTER — Other Ambulatory Visit: Payer: Self-pay

## 2021-06-08 NOTE — Telephone Encounter (Signed)
Pt stopped by requesting a Zovirax, and BP med refill please.

## 2021-06-09 ENCOUNTER — Other Ambulatory Visit: Payer: Self-pay

## 2021-06-09 MED ORDER — ACYCLOVIR 5 % EX OINT
1.0000 "application " | TOPICAL_OINTMENT | Freq: Four times a day (QID) | CUTANEOUS | 0 refills | Status: DC
  Filled 2021-06-09: qty 30, fill #0

## 2021-06-09 NOTE — Telephone Encounter (Signed)
Called and left vm   Interpreter [phone]

## 2021-06-09 NOTE — Telephone Encounter (Signed)
Medication refilled for zovirax ointment

## 2021-06-11 ENCOUNTER — Other Ambulatory Visit: Payer: Self-pay

## 2021-06-16 ENCOUNTER — Ambulatory Visit: Payer: Medicare Other | Admitting: Critical Care Medicine

## 2021-07-06 IMAGING — MR MR CERVICAL SPINE W/O CM
5 series · 48 of 48 positions shown · non-contrast
Comparison: Prior radiograph from 12/14/2019.

CLINICAL DATA: Initial evaluation for chronic bilateral neck pain.

EXAM:
MRI CERVICAL SPINE WITHOUT CONTRAST
TECHNIQUE: Multiplanar, multisequence MR imaging of the cervical spine was
performed. No intravenous contrast was administered.

[Series 3: T2 · sagittal · 3.0mm · 0.82mm/px · 6 of 12 slices shown (1 of 2)]
[im 1/12]
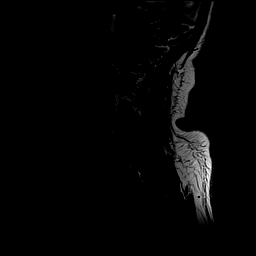
[im 3/12]
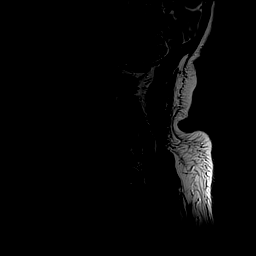
[im 5/12]
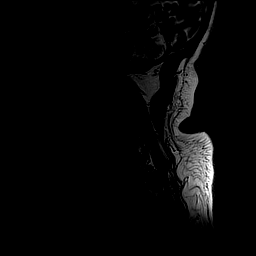
[im 7/12]
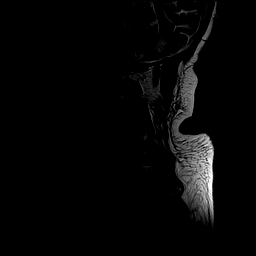
[im 9/12]
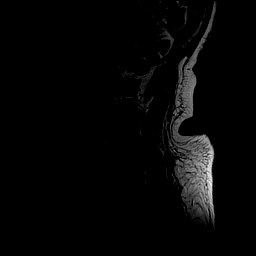
[im 12/12]
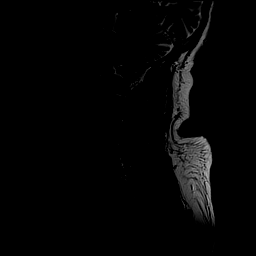

[Series 4: T1 · sagittal · 3.0mm · 0.82mm/px · 7 of 12 slices shown]
[im 1/12]
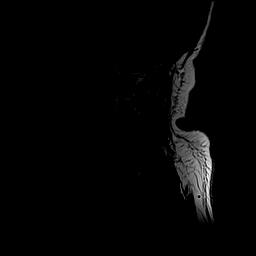
[im 2/12]
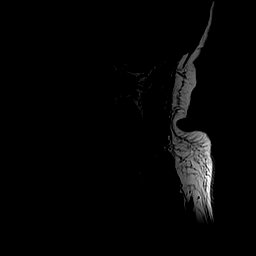
[im 4/12]
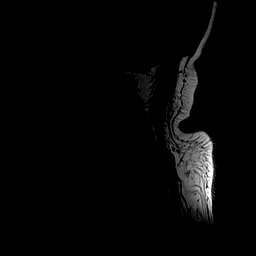
[im 6/12]
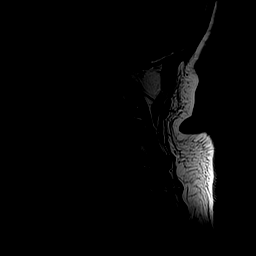
[im 8/12]
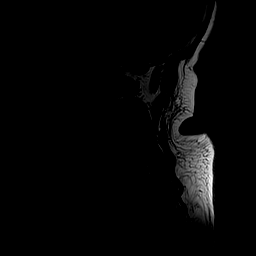
[im 10/12]
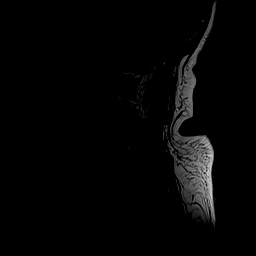
[im 12/12]
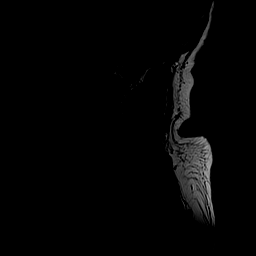

[Series 5: STIR · sagittal · 3.0mm · 0.82mm/px · 7 of 12 slices shown]
[im 1/12]
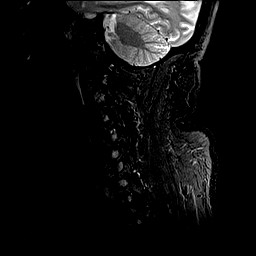
[im 2/12]
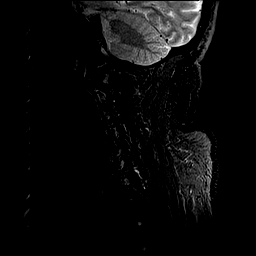
[im 4/12]
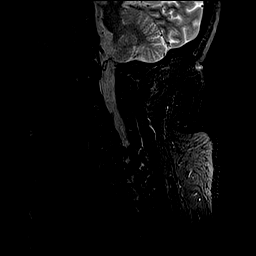
[im 6/12]
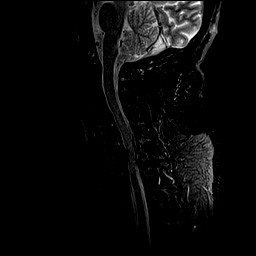
[im 8/12]
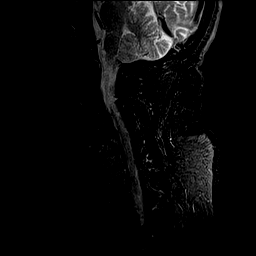
[im 10/12]
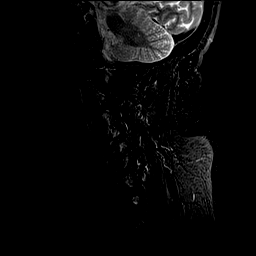
[im 12/12]
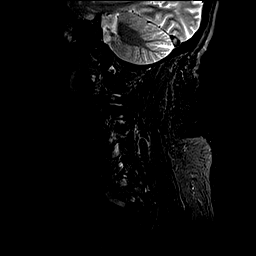

[Series 6: GRE · axial · 3.0mm · 0.74mm/px · z∈[-107,-15]mm · 14 of 26 slices shown]
[im 1/26]
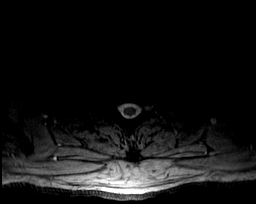
[im 2/26]
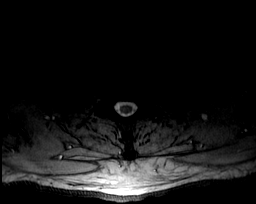
[im 4/26]
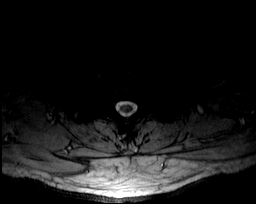
[im 6/26]
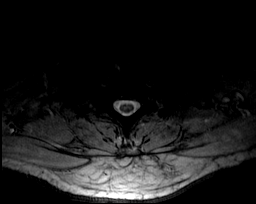
[im 8/26]
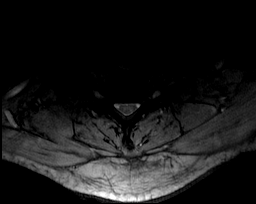
[im 10/26]
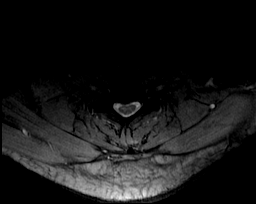
[im 12/26]
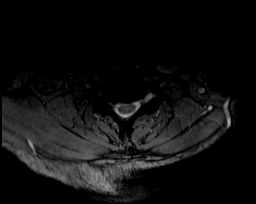
[im 14/26]
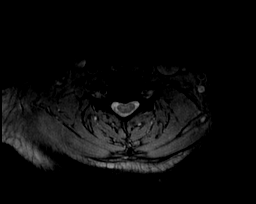
[im 16/26]
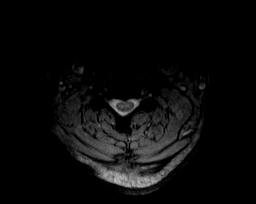
[im 18/26]
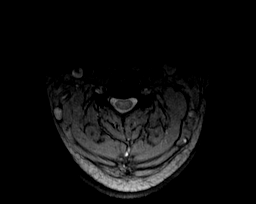
[im 20/26]
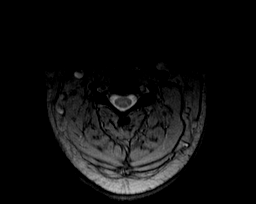
[im 22/26]
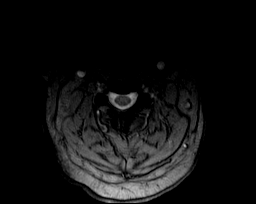
[im 24/26]
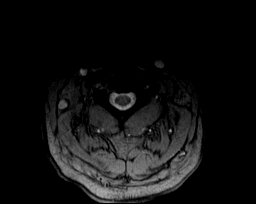
[im 26/26]
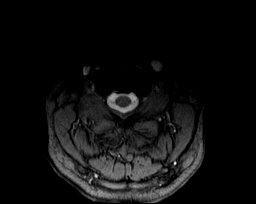

[Series 7: T2 · axial · 3.0mm · 0.74mm/px · z∈[-107,-15]mm · 14 of 26 slices shown (2 of 2)]
[im 1/26]
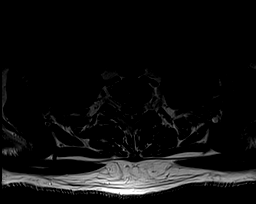
[im 2/26]
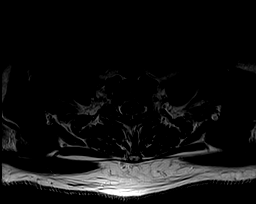
[im 4/26]
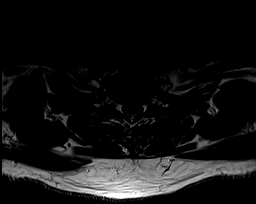
[im 6/26]
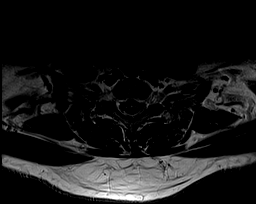
[im 8/26]
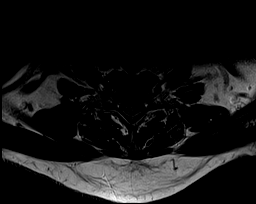
[im 10/26]
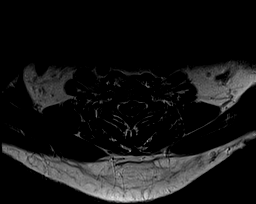
[im 12/26]
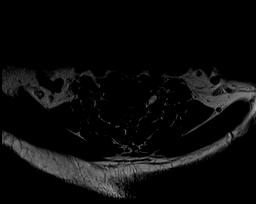
[im 14/26]
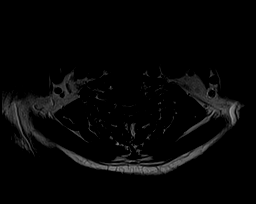
[im 16/26]
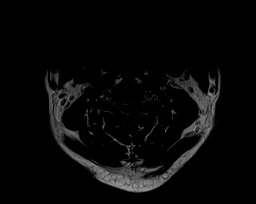
[im 18/26]
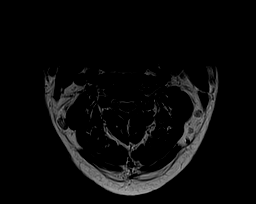
[im 20/26]
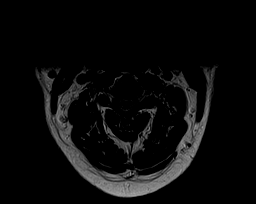
[im 22/26]
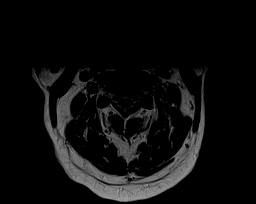
[im 24/26]
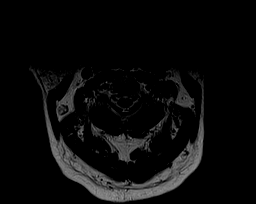
[im 26/26]
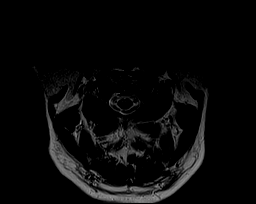

[48 of 48 positions shown; findings below may reference images not displayed]

FINDINGS: Alignment: Straightening with smooth reversal of the normal cervical
lordosis. Trace anterolisthesis of C7 on T1.

Vertebrae: Vertebral body height maintained without acute or chronic
fracture. Bone marrow signal intensity mildly heterogeneous but
within normal limits. No worrisome osseous lesions. Mild reactive
endplate change present about the C5-6 interspace. No other abnormal
marrow edema.

Cord: Normal signal and morphology.

Posterior Fossa, vertebral arteries, paraspinal tissues: Visualized
brain and posterior fossa within normal limits. Craniocervical
junction normal. Paraspinous and prevertebral soft tissues within
normal limits. Normal flow voids seen within the vertebral arteries
bilaterally.

Disc levels:

C2-C3: Small central disc protrusion mildly indents the ventral
thecal sac. No significant spinal stenosis or cord deformity.
Foramina remain patent.

C3-C4: Shallow central disc protrusion indents the ventral thecal
sac (series 6, image 8). Mild spinal stenosis with mild flattening
of the ventral cord. Superimposed mild uncovertebral hypertrophy
without significant foraminal encroachment.

C4-C5: Small central disc protrusion indents the ventral thecal sac
(series 6, image 12). Mild spinal stenosis with mild flattening of
the ventral cord, slightly greater on the right. Superimposed
bilateral uncovertebral hypertrophy without significant foraminal
narrowing.

C5-C6: Degenerative intervertebral disc space narrowing with diffuse
disc osteophyte complex, eccentric to the right. Broad posterior
component flattens and partially faces the ventral thecal sac.
Secondary flattening of the ventral cord, greater on the right. No
cord signal changes. Mild to moderate spinal stenosis. Severe right
C6 foraminal narrowing. Left neural foramen remains patent. Small
perineural cyst noted at the left neural foramen.

C6-C7: Degenerative intervertebral disc space narrowing with diffuse
disc osteophyte complex. Flattening and partial effacement of the
ventral thecal sac with resultant mild spinal stenosis. Superimposed
mild facet and ligament flavum hypertrophy. Moderate left worse than
right C7 foraminal stenosis.

C7-T1: Trace anterolisthesis. No significant disc bulge. Right-sided
facet degeneration. No canal or foraminal stenosis.

Visualized upper thoracic spine demonstrates no significant finding.
IMPRESSION: 1. Right eccentric disc osteophyte complex at C5-6 with resultant
mild to moderate canal and severe right C6 foraminal stenosis.
2. Degenerative disc osteophyte at C6-7 with resultant mild spinal
stenosis, with moderate left worse than right C7 foraminal stenosis.
3. Small central disc protrusions at C3-4 and C4-5 with resultant
mild spinal stenosis.

## 2021-07-13 ENCOUNTER — Other Ambulatory Visit: Payer: Self-pay

## 2021-07-19 NOTE — Progress Notes (Unsigned)
Established Patient Office Visit  Subjective:  Patient ID: Patrick Ortega, male    DOB: 1940-11-21  Age: 81 y.o. MRN: [id number]  CC:  Dysuria , penile pain  HPI 03/10/21 Patrick Ortega presents for primary care follow-up.  This patient was seen with [facility] interpreter audio Spanish andy [phone] Patient continues to have neck and shoulder pain.  He is being evaluated extensively by orthopedics and has follow-up appointments over the next 2 weeks for nerve conduction study and shoulder evaluations.  He continues to complain of the pain today and I reminded him and his son who is with him today who his name is Patrick Ortega these appointments are available and he should follow-up on them.  Another issue is he complains of irritation at the end of his penis burning on urination and difficulty urinating.  He been seen by urology previously was given high-dose Flomax and was told to follow-up expectantly on his PSA that was elevated at 7.0 he does not have follow-up appointments with urology currently.  He was last seen by urology December 2021.  05/24/21  Patient is seen today as a work in visit and the visit was assisted by [place] video interpreter [phone] Since the last visit patient has been seen by urology they performed a part prostate biopsy which was negative for malignancy he has been maintained on tamsulosin  Today though the patient comes in with burning sensation at the tip of his penis and on and around the uncircumcised area of the penis.  He has some blood intermittently from the head of the penis and sometimes in the urine.  He has some burning on urination. The patient also complains of neck discomfort but he underwent EMG nerve conduction study with orthopedics which did not show evidence of nerve root compression on the cervical spine.  He does not need injections in the neck area.  He did have shoulder work performed.  7/17 Patient seen in return visit visit was assisted by  Spanish video interpreter [name] [phone] Patient planes of neck pain and throat pain over the last week.  He states the lesion on his penis is improved but would like a refill on the Zovirax.  Note on arrival blood pressure is elevated 147/72. The patient has been up to now on the amlodipine 10 mg daily chlorthalidone 25 mg daily.  He states he has been compliant with this medication.   Past Medical History:  Diagnosis Date   GERD (gastroesophageal reflux disease)    Hypertension     Past Surgical History:  Procedure Laterality Date   NO PAST SURGERIES      Family History  Problem Relation Age of Onset   Ulcers Mother     Social History   Socioeconomic History   Marital status: Married    Spouse name: Not on file   Number of children: Not on file   Years of education: Not on file   Highest education level: Not on file  Occupational History   Not on file  Tobacco Use   Smoking status: Former    Packs/day: 0.25    Years: 0.50    Total pack years: 0.13    Types: Cigarettes   Smokeless tobacco: Never  Vaping Use   Vaping Use: Never used  Substance and Sexual Activity   Alcohol use: Not Currently   Drug use: Not on file   Sexual activity: Yes  Other Topics Concern   Not on file  Social History Narrative  Not on file   Social Determinants of Health   Financial Resource Strain: Not on file  Food Insecurity: Not on file  Transportation Needs: Not on file  Physical Activity: Not on file  Stress: Not on file  Social Connections: Not on file  Intimate Partner Violence: Not on file    Outpatient Medications Prior to Visit  Medication Sig Dispense Refill   diclofenac (VOLTAREN) 50 MG EC tablet Take 1 tablet (50 mg total) by mouth daily with breakfast. 30 tablet 2   acyclovir ointment (ZOVIRAX) 5 % Apply 1 application. topically 4 (four) times daily. Apply to affected areas to penis 30 g 0   amLODipine (NORVASC) 10 MG tablet Take 1 tablet (10 mg total) by mouth  daily. 90 tablet 2   chlorthalidone (HYGROTON) 25 MG tablet TAKE 1 TABLET (25 MG TOTAL) BY MOUTH DAILY. 90 tablet 2   gabapentin (NEURONTIN) 300 MG capsule Take 1 capsule (300 mg total) by mouth 3 (three) times daily. For numbness in hands 90 capsule 3   tamsulosin (FLOMAX) 0.4 MG CAPS capsule TAKE 2 CAPSULES (0.8 MG TOTAL) BY MOUTH DAILY. 60 capsule 3   No facility-administered medications prior to visit.    No Known Allergies  ROS Review of Systems  Constitutional: Negative.   HENT:  Positive for sore throat. Negative for ear pain, postnasal drip, rhinorrhea, sinus pressure, trouble swallowing and voice change.   Eyes: Negative.   Respiratory: Negative.  Negative for apnea, cough, choking, chest tightness, shortness of breath, wheezing and stridor.   Cardiovascular: Negative.  Negative for chest pain, palpitations and leg swelling.  Gastrointestinal: Negative.  Negative for abdominal distention, abdominal pain, nausea and vomiting.  Genitourinary:  Negative for difficulty urinating, dysuria, frequency, hematuria, penile discharge, penile pain, penile swelling, scrotal swelling, testicular pain and urgency.  Musculoskeletal:  Positive for neck pain. Negative for arthralgias and myalgias.  Skin: Negative.  Negative for rash.  Allergic/Immunologic: Negative.  Negative for environmental allergies and food allergies.  Neurological: Negative.  Negative for dizziness, syncope, weakness and headaches.  Hematological: Negative.  Negative for adenopathy. Does not bruise/bleed easily.  Psychiatric/Behavioral: Negative.  Negative for agitation and sleep disturbance. The patient is not nervous/anxious.       Objective:    Physical Exam Vitals reviewed.  Constitutional:      Appearance: Normal appearance. He is well-developed. He is not diaphoretic.  HENT:     Head: Normocephalic and atraumatic.     Nose: No nasal deformity, septal deviation, mucosal edema or rhinorrhea.     Right Sinus: No  maxillary sinus tenderness or frontal sinus tenderness.     Left Sinus: No maxillary sinus tenderness or frontal sinus tenderness.     Mouth/Throat:     Mouth: Mucous membranes are moist.     Pharynx: Posterior oropharyngeal erythema present. No oropharyngeal exudate.     Comments: Lymph nodes palpated bilaterally in the neck are tender Eyes:     General: No scleral icterus.    Conjunctiva/sclera: Conjunctivae normal.     Pupils: Pupils are equal, round, and reactive to light.  Neck:     Thyroid: No thyromegaly.     Vascular: No carotid bruit or JVD.     Trachea: Trachea normal. No tracheal tenderness or tracheal deviation.  Cardiovascular:     Rate and Rhythm: Normal rate and regular rhythm.     Chest Wall: PMI is not displaced.     Pulses: Normal pulses. No decreased pulses.  Heart sounds: Normal heart sounds, S1 normal and S2 normal. Heart sounds not distant. No murmur heard.    No systolic murmur is present.     No diastolic murmur is present.     No friction rub. No gallop. No S3 or S4 sounds.  Pulmonary:     Effort: No tachypnea, accessory muscle usage or respiratory distress.     Breath sounds: No stridor. No decreased breath sounds, wheezing, rhonchi or rales.  Chest:     Chest wall: No tenderness.  Abdominal:     General: Bowel sounds are normal. There is no distension.     Palpations: Abdomen is soft. Abdomen is not rigid.     Tenderness: There is no abdominal tenderness. There is no guarding or rebound.  Genitourinary:    Penis: Normal.      Testes: Normal.     Rectum: Normal.  Musculoskeletal:     Cervical back: Normal range of motion and neck supple. No edema, erythema or rigidity. No muscular tenderness. Normal range of motion.     Comments: Decreased rom of both shoulders.    Lymphadenopathy:     Head:     Right side of head: No submental or submandibular adenopathy.     Left side of head: No submental or submandibular adenopathy.     Cervical: No cervical  adenopathy.  Skin:    General: Skin is warm and dry.     Coloration: Skin is not pale.     Findings: No rash.     Nails: There is no clubbing.  Neurological:     General: No focal deficit present.     Mental Status: He is alert and oriented to person, place, and time.     Sensory: No sensory deficit.  Psychiatric:        Mood and Affect: Mood normal.        Speech: Speech normal.        Behavior: Behavior normal.        Thought Content: Thought content normal.        Judgment: Judgment normal.     BP (!) 155/79   Pulse 94   Wt 160 lb 12.8 oz (72.9 kg)   SpO2 92%   BMI 31.40 kg/m  Wt Readings from Last 3 Encounters:  07/20/21 160 lb 12.8 oz (72.9 kg)  05/25/21 161 lb 9.6 oz (73.3 kg)  04/16/21 170 lb (77.1 kg)     Health Maintenance Due  Topic Date Due   COVID-19 Vaccine (4 - Booster for Pfizer series) 03/11/2020    There are no preventive care reminders to display for this patient.  Lab Results  Component Value Date   TSH 2.080 04/25/2019   Lab Results  Component Value Date   WBC 10.4 05/25/2021   HGB 15.1 05/25/2021   HCT 44.3 05/25/2021   MCV 86 05/25/2021   PLT 463 (H) 05/25/2021   Lab Results  Component Value Date   NA 140 03/10/2021   K 4.5 03/10/2021   CO2 27 03/10/2021   GLUCOSE 95 03/10/2021   BUN 16 03/10/2021   CREATININE 1.01 03/10/2021   BILITOT 0.5 03/10/2021   ALKPHOS 119 03/10/2021   AST 25 03/10/2021   ALT 18 03/10/2021   PROT 8.6 (H) 03/10/2021   ALBUMIN 5.0 (H) 03/10/2021   CALCIUM 10.5 (H) 03/10/2021   EGFR 75 03/10/2021   Lab Results  Component Value Date   CHOL 195 05/09/2019   Lab Results  Component  Value Date   HDL 64 05/09/2019   Lab Results  Component Value Date   LDLCALC 112 (H) 05/09/2019   Lab Results  Component Value Date   TRIG 110 05/09/2019   Lab Results  Component Value Date   CHOLHDL 3.0 05/09/2019   No results found for: "HGBA1C"    Assessment & Plan:   Problem List Items Addressed This  Visit       Cardiovascular and Mediastinum   Essential hypertension    Blood pressure not well controlled plan to add valsartan 80 mg daily and continue Aldactone and amlodipine and patient returns short-term      Relevant Medications   amLODipine (NORVASC) 10 MG tablet   chlorthalidone (HYGROTON) 25 MG tablet   valsartan (DIOVAN) 80 MG tablet     Other   Dysuria    Improved at this visit we will renew Zovirax cream      Other Visit Diagnoses     Uncontrolled hypertension       Relevant Medications   amLODipine (NORVASC) 10 MG tablet   chlorthalidone (HYGROTON) 25 MG tablet   valsartan (DIOVAN) 80 MG tablet   Cervical radiculopathy       Relevant Medications   gabapentin (NEURONTIN) 300 MG capsule      Meds ordered this encounter  Medications   acyclovir ointment (ZOVIRAX) 5 %    Sig: Apply 1 Application topically 4 (four) times daily. Apply to affected areas to penis    Dispense:  30 g    Refill:  0   amLODipine (NORVASC) 10 MG tablet    Sig: Take 1 tablet (10 mg total) by mouth daily.    Dispense:  90 tablet    Refill:  2   chlorthalidone (HYGROTON) 25 MG tablet    Sig: TAKE 1 TABLET (25 MG TOTAL) BY MOUTH DAILY.    Dispense:  90 tablet    Refill:  2   gabapentin (NEURONTIN) 300 MG capsule    Sig: Take 1 capsule (300 mg total) by mouth 3 (three) times daily. For numbness in hands    Dispense:  90 capsule    Refill:  3   tamsulosin (FLOMAX) 0.4 MG CAPS capsule    Sig: TAKE 2 CAPSULES (0.8 MG TOTAL) BY MOUTH DAILY.    Dispense:  60 capsule    Refill:  3   amoxicillin-clavulanate (AUGMENTIN) 875-125 MG tablet    Sig: Take 1 tablet by mouth 2 (two) times daily.    Dispense:  20 tablet    Refill:  0   valsartan (DIOVAN) 80 MG tablet    Sig: Take 1 tablet (80 mg total) by mouth daily.    Dispense:  90 tablet    Refill:  3  38 minutes spent extra time needed because of language barrier multiple systems assessed  Follow-up: Return in about 4 months (around  11/20/2021).    Asencion Noble, MD

## 2021-07-20 ENCOUNTER — Encounter: Payer: Self-pay | Admitting: Critical Care Medicine

## 2021-07-20 ENCOUNTER — Ambulatory Visit: Payer: Medicare Other | Attending: Critical Care Medicine | Admitting: Critical Care Medicine

## 2021-07-20 ENCOUNTER — Other Ambulatory Visit: Payer: Self-pay

## 2021-07-20 VITALS — BP 155/79 | HR 94 | Wt 160.8 lb

## 2021-07-20 DIAGNOSIS — N4889 Other specified disorders of penis: Secondary | ICD-10-CM | POA: Diagnosis not present

## 2021-07-20 DIAGNOSIS — I10 Essential (primary) hypertension: Secondary | ICD-10-CM | POA: Diagnosis not present

## 2021-07-20 DIAGNOSIS — M5412 Radiculopathy, cervical region: Secondary | ICD-10-CM | POA: Diagnosis not present

## 2021-07-20 DIAGNOSIS — Z713 Dietary counseling and surveillance: Secondary | ICD-10-CM | POA: Insufficient documentation

## 2021-07-20 DIAGNOSIS — Z79899 Other long term (current) drug therapy: Secondary | ICD-10-CM | POA: Insufficient documentation

## 2021-07-20 DIAGNOSIS — R300 Dysuria: Secondary | ICD-10-CM

## 2021-07-20 DIAGNOSIS — M25519 Pain in unspecified shoulder: Secondary | ICD-10-CM | POA: Diagnosis not present

## 2021-07-20 DIAGNOSIS — Z79624 Long term (current) use of inhibitors of nucleotide synthesis: Secondary | ICD-10-CM | POA: Diagnosis not present

## 2021-07-20 MED ORDER — CHLORTHALIDONE 25 MG PO TABS
ORAL_TABLET | Freq: Every day | ORAL | 2 refills | Status: DC
  Filled 2021-07-20: qty 90, fill #0
  Filled 2021-09-22: qty 90, 90d supply, fill #0

## 2021-07-20 MED ORDER — TAMSULOSIN HCL 0.4 MG PO CAPS
ORAL_CAPSULE | ORAL | 3 refills | Status: DC
  Filled 2021-07-20: qty 60, fill #0
  Filled 2021-09-21: qty 60, 30d supply, fill #0

## 2021-07-20 MED ORDER — GABAPENTIN 300 MG PO CAPS
300.0000 mg | ORAL_CAPSULE | Freq: Three times a day (TID) | ORAL | 3 refills | Status: DC
  Filled 2021-07-20: qty 90, 30d supply, fill #0

## 2021-07-20 MED ORDER — ACYCLOVIR 5 % EX OINT
1.0000 | TOPICAL_OINTMENT | Freq: Four times a day (QID) | CUTANEOUS | 0 refills | Status: DC
  Filled 2021-07-20: qty 30, 15d supply, fill #0

## 2021-07-20 MED ORDER — VALSARTAN 80 MG PO TABS
80.0000 mg | ORAL_TABLET | Freq: Every day | ORAL | 3 refills | Status: DC
  Filled 2021-07-20: qty 30, 30d supply, fill #0

## 2021-07-20 MED ORDER — AMLODIPINE BESYLATE 10 MG PO TABS
10.0000 mg | ORAL_TABLET | Freq: Every day | ORAL | 2 refills | Status: DC
  Filled 2021-07-20 – 2021-09-22 (×2): qty 90, 90d supply, fill #0

## 2021-07-20 MED ORDER — AMOXICILLIN-POT CLAVULANATE 875-125 MG PO TABS
1.0000 | ORAL_TABLET | Freq: Two times a day (BID) | ORAL | 0 refills | Status: DC
  Filled 2021-07-20: qty 20, 10d supply, fill #0

## 2021-07-20 NOTE — Assessment & Plan Note (Signed)
Improved at this visit we will renew Zovirax cream

## 2021-07-20 NOTE — Assessment & Plan Note (Signed)
Blood pressure not well controlled plan to add valsartan 80 mg daily and continue Aldactone and amlodipine and patient returns short-term

## 2021-07-20 NOTE — Patient Instructions (Signed)
Begin valsartan 1 pill daily for blood pressure  Use acyclovir ointment to the penis 3-4 times daily for penis irritation  Stay on amlodipine and chlorthalidone daily for blood pressure  Take Augmentin 1 twice daily for 7 days for throat infection  Stay on gabapentin for nerve pain in the shoulders and hands  Return to see Dr. Joya Gaskins 4 months  Handout of potential dentist were given to you that could potentially pull your teeth provide dental care with your Medicare  Comience valsartn 1 pastilla al da para la presin arterial  Use ungento de aciclovir en el pene 3-4 veces al da para la irritacin del pene  Siga tomando amlodipino y clortalidona diariamente para la presin arterial  Tome Augmentin [place] 7 das para la infeccin de garganta  Contine con la gabapentina para el dolor nervioso en los hombros y [place].  Volver a ver al Dr. Joya Gaskins 4 meses  Se le entreg un folleto de un posible dentista que podra sacarle los dientes. Brindar atencin dental con su Medicare.

## 2021-07-21 ENCOUNTER — Other Ambulatory Visit: Payer: Self-pay

## 2021-07-22 ENCOUNTER — Other Ambulatory Visit: Payer: Self-pay

## 2021-09-21 ENCOUNTER — Other Ambulatory Visit: Payer: Self-pay

## 2021-09-21 NOTE — Progress Notes (Signed)
Established Patient Office Visit  Subjective:  Patient ID: Josemaria Brining, male    DOB: [birth year]  Age: 81 y.o. MRN: [id number]  CC:  Dysuria , penile pain  HPI 03/10/21 [name] presents for primary care follow-up.  This patient was seen with [facility] interpreter audio Spanish andy [phone] Patient continues to have neck and shoulder pain.  He is being evaluated extensively by orthopedics and has follow-up appointments over the next 2 weeks for nerve conduction study and shoulder evaluations.  He continues to complain of the pain today and I reminded him and his son who is with him today who his name is [name] these appointments are available and he should follow-up on them.  Another issue is he complains of irritation at the end of his penis burning on urination and difficulty urinating.  He been seen by urology previously was given high-dose Flomax and was told to follow-up expectantly on his PSA that was elevated at 7.0 he does not have follow-up appointments with urology currently.  He was last seen by urology December 2021.  05/24/21  Patient is seen today as a work in visit and the visit was assisted by [place] video interpreter [phone] Since the last visit patient has been seen by urology they performed a part prostate biopsy which was negative for malignancy he has been maintained on tamsulosin  Today though the patient comes in with burning sensation at the tip of his penis and on and around the uncircumcised area of the penis.  He has some blood intermittently from the head of the penis and sometimes in the urine.  He has some burning on urination. The patient also complains of neck discomfort but he underwent EMG nerve conduction study with orthopedics which did not show evidence of nerve root compression on the cervical spine.  He does not need injections in the neck area.  He did have shoulder work performed.  7/17 Patient seen in return visit visit was assisted by  Spanish video interpreter [name] [phone] Patient planes of neck pain and throat pain over the last week.  He states the lesion on his penis is improved but would like a refill on the Zovirax.  Note on arrival blood pressure is elevated 147/72. The patient has been up to now on the amlodipine 10 mg daily chlorthalidone 25 mg daily.  He states he has been compliant with this medication.   Past Medical History:  Diagnosis Date  . GERD (gastroesophageal reflux disease)   . Hypertension     Past Surgical History:  Procedure Laterality Date  . NO PAST SURGERIES      Family History  Problem Relation Age of Onset  . Ulcers Mother     Social History   Socioeconomic History  . Marital status: Married    Spouse name: Not on file  . Number of children: Not on file  . Years of education: Not on file  . Highest education level: Not on file  Occupational History  . Not on file  Tobacco Use  . Smoking status: Former    Packs/day: 0.25    Years: 0.50    Total pack years: 0.13    Types: Cigarettes  . Smokeless tobacco: Never  Vaping Use  . Vaping Use: Never used  Substance and Sexual Activity  . Alcohol use: Not Currently  . Drug use: Not on file  . Sexual activity: Yes  Other Topics Concern  . Not on file  Social History Narrative  .  Not on file   Social Determinants of Health   Financial Resource Strain: Not on file  Food Insecurity: Not on file  Transportation Needs: Not on file  Physical Activity: Not on file  Stress: Not on file  Social Connections: Not on file  Intimate Partner Violence: Not on file    Outpatient Medications Prior to Visit  Medication Sig Dispense Refill  . acyclovir ointment (ZOVIRAX) 5 % Apply 1 Application topically 4 (four) times daily. Apply to affected areas to penis 30 g 0  . amLODipine (NORVASC) 10 MG tablet Take 1 tablet (10 mg total) by mouth daily. 90 tablet 2  . amoxicillin-clavulanate (AUGMENTIN) 875-125 MG tablet Take 1 tablet by  mouth 2 (two) times daily. 20 tablet 0  . chlorthalidone (HYGROTON) 25 MG tablet TAKE 1 TABLET (25 MG TOTAL) BY MOUTH DAILY. 90 tablet 2  . diclofenac (VOLTAREN) 50 MG EC tablet Take 1 tablet (50 mg total) by mouth daily with breakfast. 30 tablet 2  . gabapentin (NEURONTIN) 300 MG capsule Take 1 capsule (300 mg total) by mouth 3 (three) times daily. For numbness in hands 90 capsule 3  . tamsulosin (FLOMAX) 0.4 MG CAPS capsule TAKE 2 CAPSULES (0.8 MG TOTAL) BY MOUTH DAILY. 60 capsule 3  . valsartan (DIOVAN) 80 MG tablet Take 1 tablet (80 mg total) by mouth daily. 90 tablet 3   No facility-administered medications prior to visit.    No Known Allergies  ROS Review of Systems  Constitutional: Negative.   HENT:  Positive for sore throat. Negative for ear pain, postnasal drip, rhinorrhea, sinus pressure, trouble swallowing and voice change.   Eyes: Negative.   Respiratory: Negative.  Negative for apnea, cough, choking, chest tightness, shortness of breath, wheezing and stridor.   Cardiovascular: Negative.  Negative for chest pain, palpitations and leg swelling.  Gastrointestinal: Negative.  Negative for abdominal distention, abdominal pain, nausea and vomiting.  Genitourinary:  Negative for difficulty urinating, dysuria, frequency, hematuria, penile discharge, penile pain, penile swelling, scrotal swelling, testicular pain and urgency.  Musculoskeletal:  Positive for neck pain. Negative for arthralgias and myalgias.  Skin: Negative.  Negative for rash.  Allergic/Immunologic: Negative.  Negative for environmental allergies and food allergies.  Neurological: Negative.  Negative for dizziness, syncope, weakness and headaches.  Hematological: Negative.  Negative for adenopathy. Does not bruise/bleed easily.  Psychiatric/Behavioral: Negative.  Negative for agitation and sleep disturbance. The patient is not nervous/anxious.       Objective:    Physical Exam Vitals reviewed.  Constitutional:       Appearance: Normal appearance. He is well-developed. He is not diaphoretic.  HENT:     Head: Normocephalic and atraumatic.     Nose: No nasal deformity, septal deviation, mucosal edema or rhinorrhea.     Right Sinus: No maxillary sinus tenderness or frontal sinus tenderness.     Left Sinus: No maxillary sinus tenderness or frontal sinus tenderness.     Mouth/Throat:     Mouth: Mucous membranes are moist.     Pharynx: Posterior oropharyngeal erythema present. No oropharyngeal exudate.     Comments: Lymph nodes palpated bilaterally in the neck are tender Eyes:     General: No scleral icterus.    Conjunctiva/sclera: Conjunctivae normal.     Pupils: Pupils are equal, round, and reactive to light.  Neck:     Thyroid: No thyromegaly.     Vascular: No carotid bruit or JVD.     Trachea: Trachea normal. No tracheal tenderness or tracheal deviation.  Cardiovascular:     Rate and Rhythm: Normal rate and regular rhythm.     Chest Wall: PMI is not displaced.     Pulses: Normal pulses. No decreased pulses.     Heart sounds: Normal heart sounds, S1 normal and S2 normal. Heart sounds not distant. No murmur heard.    No systolic murmur is present.     No diastolic murmur is present.     No friction rub. No gallop. No S3 or S4 sounds.  Pulmonary:     Effort: No tachypnea, accessory muscle usage or respiratory distress.     Breath sounds: No stridor. No decreased breath sounds, wheezing, rhonchi or rales.  Chest:     Chest wall: No tenderness.  Abdominal:     General: Bowel sounds are normal. There is no distension.     Palpations: Abdomen is soft. Abdomen is not rigid.     Tenderness: There is no abdominal tenderness. There is no guarding or rebound.  Genitourinary:    Penis: Normal.      Testes: Normal.     Rectum: Normal.  Musculoskeletal:     Cervical back: Normal range of motion and neck supple. No edema, erythema or rigidity. No muscular tenderness. Normal range of motion.      Comments: Decreased rom of both shoulders.    Lymphadenopathy:     Head:     Right side of head: No submental or submandibular adenopathy.     Left side of head: No submental or submandibular adenopathy.     Cervical: No cervical adenopathy.  Skin:    General: Skin is warm and dry.     Coloration: Skin is not pale.     Findings: No rash.     Nails: There is no clubbing.  Neurological:     General: No focal deficit present.     Mental Status: He is alert and oriented to person, place, and time.     Sensory: No sensory deficit.  Psychiatric:        Mood and Affect: Mood normal.        Speech: Speech normal.        Behavior: Behavior normal.        Thought Content: Thought content normal.        Judgment: Judgment normal.    There were no vitals taken for this visit. Wt Readings from Last 3 Encounters:  07/20/21 160 lb 12.8 oz (72.9 kg)  05/25/21 161 lb 9.6 oz (73.3 kg)  04/16/21 170 lb (77.1 kg)     Health Maintenance Due  Topic Date Due  . COVID-19 Vaccine (4 - Pfizer risk series) 03/11/2020  . INFLUENZA VACCINE  08/04/2021    There are no preventive care reminders to display for this patient.  Lab Results  Component Value Date   TSH 2.080 04/25/2019   Lab Results  Component Value Date   WBC 10.4 05/25/2021   HGB 15.1 05/25/2021   HCT 44.3 05/25/2021   MCV 86 05/25/2021   PLT 463 (H) 05/25/2021   Lab Results  Component Value Date   NA 140 03/10/2021   K 4.5 03/10/2021   CO2 27 03/10/2021   GLUCOSE 95 03/10/2021   BUN 16 03/10/2021   CREATININE 1.01 03/10/2021   BILITOT 0.5 03/10/2021   ALKPHOS 119 03/10/2021   AST 25 03/10/2021   ALT 18 03/10/2021   PROT 8.6 (H) 03/10/2021   ALBUMIN 5.0 (H) 03/10/2021   CALCIUM 10.5 (H) 03/10/2021  EGFR 75 03/10/2021   Lab Results  Component Value Date   CHOL 195 05/09/2019   Lab Results  Component Value Date   HDL 64 05/09/2019   Lab Results  Component Value Date   LDLCALC 112 (H) 05/09/2019   Lab  Results  Component Value Date   TRIG 110 05/09/2019   Lab Results  Component Value Date   CHOLHDL 3.0 05/09/2019   No results found for: "HGBA1C"    Assessment & Plan:   Problem List Items Addressed This Visit   None No orders of the defined types were placed in this encounter. 38 minutes spent extra time needed because of language barrier multiple systems assessed  Follow-up: No follow-ups on file.    Asencion Noble, MD

## 2021-09-22 ENCOUNTER — Other Ambulatory Visit: Payer: Self-pay

## 2021-09-22 ENCOUNTER — Encounter: Payer: Self-pay | Admitting: Critical Care Medicine

## 2021-09-22 ENCOUNTER — Ambulatory Visit: Payer: Medicare Other | Attending: Critical Care Medicine | Admitting: Critical Care Medicine

## 2021-09-22 VITALS — BP 146/69 | HR 58 | Ht 61.0 in | Wt 153.6 lb

## 2021-09-22 DIAGNOSIS — Z79624 Long term (current) use of inhibitors of nucleotide synthesis: Secondary | ICD-10-CM | POA: Insufficient documentation

## 2021-09-22 DIAGNOSIS — B009 Herpesviral infection, unspecified: Secondary | ICD-10-CM | POA: Diagnosis not present

## 2021-09-22 DIAGNOSIS — Z79899 Other long term (current) drug therapy: Secondary | ICD-10-CM | POA: Diagnosis not present

## 2021-09-22 DIAGNOSIS — N4889 Other specified disorders of penis: Secondary | ICD-10-CM | POA: Diagnosis not present

## 2021-09-22 DIAGNOSIS — M25519 Pain in unspecified shoulder: Secondary | ICD-10-CM | POA: Insufficient documentation

## 2021-09-22 DIAGNOSIS — Z23 Encounter for immunization: Secondary | ICD-10-CM | POA: Diagnosis not present

## 2021-09-22 DIAGNOSIS — R070 Pain in throat: Secondary | ICD-10-CM | POA: Insufficient documentation

## 2021-09-22 DIAGNOSIS — I10 Essential (primary) hypertension: Secondary | ICD-10-CM

## 2021-09-22 DIAGNOSIS — M542 Cervicalgia: Secondary | ICD-10-CM | POA: Insufficient documentation

## 2021-09-22 DIAGNOSIS — R300 Dysuria: Secondary | ICD-10-CM | POA: Diagnosis not present

## 2021-09-22 DIAGNOSIS — M5412 Radiculopathy, cervical region: Secondary | ICD-10-CM | POA: Diagnosis not present

## 2021-09-22 MED ORDER — TAMSULOSIN HCL 0.4 MG PO CAPS
0.8000 mg | ORAL_CAPSULE | Freq: Every day | ORAL | 3 refills | Status: DC
  Filled 2021-09-22: qty 60, 30d supply, fill #0
  Filled 2021-10-19: qty 60, 30d supply, fill #1
  Filled 2021-11-12: qty 120, 60d supply, fill #2

## 2021-09-22 MED ORDER — CHLORTHALIDONE 25 MG PO TABS
25.0000 mg | ORAL_TABLET | Freq: Every day | ORAL | 2 refills | Status: DC
  Filled 2021-09-22 – 2021-09-29 (×2): qty 90, 90d supply, fill #0

## 2021-09-22 MED ORDER — ACYCLOVIR 5 % EX OINT
1.0000 | TOPICAL_OINTMENT | Freq: Four times a day (QID) | CUTANEOUS | 0 refills | Status: DC
  Filled 2021-09-22: qty 30, 15d supply, fill #0

## 2021-09-22 MED ORDER — CHLORHEXIDINE GLUCONATE 0.12 % MT SOLN
15.0000 mL | Freq: Two times a day (BID) | OROMUCOSAL | 0 refills | Status: DC
  Filled 2021-09-22: qty 120, 4d supply, fill #0

## 2021-09-22 MED ORDER — DICLOFENAC SODIUM 50 MG PO TBEC
50.0000 mg | DELAYED_RELEASE_TABLET | Freq: Every day | ORAL | 2 refills | Status: DC
  Filled 2021-09-22: qty 30, 30d supply, fill #0
  Filled 2021-10-19: qty 30, 30d supply, fill #1

## 2021-09-22 MED ORDER — GABAPENTIN 300 MG PO CAPS
300.0000 mg | ORAL_CAPSULE | Freq: Three times a day (TID) | ORAL | 3 refills | Status: DC
  Filled 2021-09-22: qty 90, 30d supply, fill #0
  Filled 2021-11-02: qty 90, 30d supply, fill #1
  Filled 2021-11-25: qty 180, 60d supply, fill #2

## 2021-09-22 MED ORDER — AMLODIPINE BESYLATE 10 MG PO TABS
10.0000 mg | ORAL_TABLET | Freq: Every day | ORAL | 2 refills | Status: DC
  Filled 2021-09-29: qty 90, 90d supply, fill #0

## 2021-09-22 NOTE — Assessment & Plan Note (Signed)
We will prescribe Zovirax cream

## 2021-09-22 NOTE — Patient Instructions (Addendum)
Pick up amlodipine 1 pill daily and chlorthalidone 1 pill daily you would have gotten those in July but she did not pick those up then these are being filled today take these for blood pressure  Stay on valsartan 1 pill daily for blood pressure  Refills on Zovirax cream sent to pharmacy  Flu vaccine given  Return to see Dr. Joya Gaskins 3 months  Recoja 1 [place] y [place]. Las habra recibido en julio, pero ella no las recogi, entonces se estn surtiendo hoy, tmelas para la presin arterial.  Mantngase con valsartn 1 pastilla al da para la presin arterial.  Recargas de crema Zovirax enviadas a farmacia  Se administra la vacuna contra la gripe  Volver a ver al Dr. Joya Gaskins 3 meses

## 2021-09-22 NOTE — Assessment & Plan Note (Signed)
Continue with valsartan and add amlodipine and chlorthalidone and he will pick up today return for short-term follow-up for blood pressure recheck

## 2021-09-23 ENCOUNTER — Other Ambulatory Visit: Payer: Self-pay

## 2021-09-28 ENCOUNTER — Other Ambulatory Visit: Payer: Self-pay

## 2021-09-29 ENCOUNTER — Other Ambulatory Visit: Payer: Self-pay

## 2021-10-02 ENCOUNTER — Telehealth: Payer: Self-pay | Admitting: Critical Care Medicine

## 2021-10-02 DIAGNOSIS — R21 Rash and other nonspecific skin eruption: Secondary | ICD-10-CM

## 2021-10-02 NOTE — Telephone Encounter (Signed)
Patient came into the office need a referral to the demonologist. Patient son request to be message when referral is been done

## 2021-10-04 NOTE — Telephone Encounter (Signed)
Referral made 

## 2021-10-04 NOTE — Addendum Note (Signed)
Addended by: Asencion Noble E on: 10/04/2021 11:02 AM   Modules accepted: Orders

## 2021-10-05 NOTE — Telephone Encounter (Signed)
Called and left voicemail about referral   Interpreter [phone]

## 2021-10-19 ENCOUNTER — Other Ambulatory Visit: Payer: Self-pay | Admitting: Critical Care Medicine

## 2021-10-19 ENCOUNTER — Other Ambulatory Visit: Payer: Self-pay

## 2021-10-19 MED ORDER — ACYCLOVIR 5 % EX OINT
1.0000 | TOPICAL_OINTMENT | Freq: Four times a day (QID) | CUTANEOUS | 0 refills | Status: DC
  Filled 2021-10-19 – 2021-11-12 (×2): qty 30, 15d supply, fill #0

## 2021-10-20 ENCOUNTER — Other Ambulatory Visit: Payer: Self-pay

## 2021-10-21 ENCOUNTER — Other Ambulatory Visit: Payer: Self-pay

## 2021-10-28 ENCOUNTER — Other Ambulatory Visit: Payer: Self-pay

## 2021-11-02 ENCOUNTER — Other Ambulatory Visit: Payer: Self-pay

## 2021-11-09 ENCOUNTER — Other Ambulatory Visit: Payer: Self-pay

## 2021-11-09 ENCOUNTER — Encounter: Payer: Self-pay | Admitting: Nurse Practitioner

## 2021-11-09 ENCOUNTER — Ambulatory Visit (INDEPENDENT_AMBULATORY_CARE_PROVIDER_SITE_OTHER): Payer: Medicare Other | Admitting: Nurse Practitioner

## 2021-11-09 VITALS — BP 128/76 | HR 89 | Temp 98.8°F | Wt 159.0 lb

## 2021-11-09 DIAGNOSIS — R600 Localized edema: Secondary | ICD-10-CM | POA: Diagnosis not present

## 2021-11-09 DIAGNOSIS — M542 Cervicalgia: Secondary | ICD-10-CM

## 2021-11-09 MED ORDER — TIZANIDINE HCL 2 MG PO CAPS
2.0000 mg | ORAL_CAPSULE | Freq: Three times a day (TID) | ORAL | 0 refills | Status: DC | PRN
  Filled 2021-11-09: qty 30, 10d supply, fill #0

## 2021-11-09 MED ORDER — TIZANIDINE HCL 2 MG PO CAPS
2.0000 mg | ORAL_CAPSULE | Freq: Three times a day (TID) | ORAL | 0 refills | Status: DC
  Filled 2021-11-09: qty 30, 10d supply, fill #0

## 2021-11-09 NOTE — Assessment & Plan Note (Signed)
Mild nonpitting edema noted on bilateral lower extremity. Use of compression socks encouraged.  Patient told to keep legs elevated when sitting to help decrease swelling

## 2021-11-09 NOTE — Assessment & Plan Note (Signed)
Aching pain rated 7/10 worse with movement of the neck Currently taking diclofenac 50 mg twice daily for arthritis Start tizanidine 2 mg 3 times daily as needed. Application of heat encouraged Patient encouraged to follow-up with orthopedics if his pain does not get better

## 2021-11-09 NOTE — Progress Notes (Signed)
New Patient Office Visit  Subjective:  Patient ID: Patrick Ortega, male    DOB: 29-Sep-1940  Age: 81 y.o. MRN: [id number]  CC:  Chief Complaint  Patient presents with   Neck Pain    When moving from side to side and a lot of pressure    HPI Patrick Ortega is a 81 y.o. male with past medical history of essential hypertension, osteoarthritis of left hip, chronic right shoulder pain, vitamin D deficiency, who presents with complaints of neck pain for the past 5 months.  Patient states that he hears a popping sound when he moves his neck ,rated his neck pain as aching pain 7/10 he has been taking Tylenol as needed, diclofenac tablets 50 mg twice daily but no improvement.  Patient denies seizures, fever, chills, trauma.  Patient of Dr. Joya Gaskins.  Patient is accompanied by his grand daughter who assisted with Spanish translation today.     Past Medical History:  Diagnosis Date   GERD (gastroesophageal reflux disease)    Hypertension     Past Surgical History:  Procedure Laterality Date   NO PAST SURGERIES      Family History  Problem Relation Age of Onset   Ulcers Mother     Social History   Socioeconomic History   Marital status: Married    Spouse name: Not on file   Number of children: Not on file   Years of education: Not on file   Highest education level: Not on file  Occupational History   Not on file  Tobacco Use   Smoking status: Former    Packs/day: 0.25    Years: 0.50    Total pack years: 0.13    Types: Cigarettes   Smokeless tobacco: Never  Vaping Use   Vaping Use: Never used  Substance and Sexual Activity   Alcohol use: Not Currently   Drug use: Not on file   Sexual activity: Yes  Other Topics Concern   Not on file  Social History Narrative   Not on file   Social Determinants of Health   Financial Resource Strain: Not on file  Food Insecurity: Not on file  Transportation Needs: Not on file  Physical Activity: Not on file  Stress: Not on file   Social Connections: Not on file  Intimate Partner Violence: Not on file    ROS Review of Systems  Constitutional: Negative.   Respiratory:  Negative for chest tightness, shortness of breath and wheezing.   Cardiovascular:  Negative for chest pain, palpitations and leg swelling.  Gastrointestinal:  Negative for abdominal distention, blood in stool and constipation.  Musculoskeletal:  Positive for arthralgias and neck pain.  Neurological:  Negative for seizures, syncope, facial asymmetry, speech difficulty and numbness.  Psychiatric/Behavioral: Negative.  Negative for agitation, behavioral problems and confusion.     Objective:   Today's Vitals: BP 128/76   Pulse 89   Temp 98.8 F (37.1 C)   Wt 159 lb (72.1 kg)   SpO2 97%   BMI 30.04 kg/m   Physical Exam Constitutional:      General: He is not in acute distress.    Appearance: He is obese. He is not ill-appearing, toxic-appearing or diaphoretic.  Eyes:     General: No scleral icterus.       Right eye: No discharge.        Left eye: No discharge.     Extraocular Movements: Extraocular movements intact.     Conjunctiva/sclera: Conjunctivae normal.  Pupils: Pupils are equal, round, and reactive to light.  Cardiovascular:     Pulses: Normal pulses.     Heart sounds: Normal heart sounds. No murmur heard.    No friction rub. No gallop.  Pulmonary:     Effort: No respiratory distress.     Breath sounds: No stridor. No wheezing, rhonchi or rales.  Chest:     Chest wall: No tenderness.  Abdominal:     General: There is no distension.     Tenderness: There is no abdominal tenderness. There is no guarding.  Musculoskeletal:        General: No swelling, tenderness, deformity or signs of injury.     Comments: Mild non pitting edema to BLE, has palpable pedal pulses , skin war, and dry. Voiced tenderness on range of motion of the neck, no swelling or redness noted, no tenderness on palpation of the neck.  Ambulating with a  cane  Skin:    Capillary Refill: Capillary refill takes less than 2 seconds.  Neurological:     Mental Status: He is alert.  Psychiatric:        Mood and Affect: Mood normal.        Behavior: Behavior normal.        Thought Content: Thought content normal.        Judgment: Judgment normal.     Assessment & Plan:   Problem List Items Addressed This Visit       Other   Neck pain - Primary    Aching pain rated 7/10 worse with movement of the neck Currently taking diclofenac 50 mg twice daily for arthritis Start tizanidine 2 mg 3 times daily as needed. Application of heat encouraged Patient encouraged to follow-up with orthopedics if his pain does not get better      Relevant Medications   tizanidine (ZANAFLEX) 2 MG capsule   Bilateral lower extremity edema    Mild nonpitting edema noted on bilateral lower extremity. Use of compression socks encouraged.  Patient told to keep legs elevated when sitting to help decrease swelling       Outpatient Encounter Medications as of 11/09/2021  Medication Sig   acyclovir ointment (ZOVIRAX) 5 % Apply to affected areas of penis 4 (four) times daily.   amLODipine (NORVASC) 10 MG tablet Take 1 tablet (10 mg total) by mouth daily.   chlorthalidone (HYGROTON) 25 MG tablet Take 1 tablet (25 mg total) by mouth daily.   diclofenac (VOLTAREN) 50 MG EC tablet Take 1 tablet (50 mg total) by mouth daily with breakfast.   gabapentin (NEURONTIN) 300 MG capsule Take 1 capsule (300 mg total) by mouth 3 (three) times daily. For numbness in hands   tamsulosin (FLOMAX) 0.4 MG CAPS capsule Take 2 capsules (0.8 mg total) by mouth daily.   valsartan (DIOVAN) 80 MG tablet Take 1 tablet (80 mg total) by mouth daily.   [DISCONTINUED] tizanidine (ZANAFLEX) 2 MG capsule Take 1 capsule (2 mg total) by mouth 3 (three) times daily.   tizanidine (ZANAFLEX) 2 MG capsule Take 1 capsule (2 mg total) by mouth 3 (three) times daily as needed for muscle spasms.   No  facility-administered encounter medications on file as of 11/09/2021.    Follow-up: No follow-ups on file.   Renee Rival, FNP

## 2021-11-09 NOTE — Patient Instructions (Signed)
1. Neck pain - tizanidine (ZANAFLEX) 2 MG capsule; Take 1 capsule (2 mg total) by mouth 3 (three) times daily. , take with diclofenac '50mg'$  twice daily.    It is important that you exercise regularly at least 30 minutes 5 times a week as tolerated  Think about what you will eat, plan ahead. Choose " clean, green, fresh or frozen" over canned, processed or packaged foods which are more sugary, salty and fatty. 70 to 75% of food eaten should be vegetables and fruit. Three meals at set times with snacks allowed between meals, but they must be fruit or vegetables. Aim to eat over a 12 hour period , example 7 am to 7 pm, and STOP after  your last meal of the day. Drink water,generally about 64 ounces per day, no other drink is as healthy. Fruit juice is best enjoyed in a healthy way, by EATING the fruit.  Thanks for choosing [name] we consider it a privelige to serve you.

## 2021-11-10 ENCOUNTER — Other Ambulatory Visit: Payer: Self-pay

## 2021-11-11 ENCOUNTER — Other Ambulatory Visit: Payer: Self-pay

## 2021-11-12 ENCOUNTER — Other Ambulatory Visit: Payer: Self-pay

## 2021-11-25 ENCOUNTER — Other Ambulatory Visit: Payer: Self-pay

## 2021-11-25 ENCOUNTER — Other Ambulatory Visit: Payer: Self-pay | Admitting: Critical Care Medicine

## 2021-11-25 MED ORDER — ACYCLOVIR 5 % EX OINT
1.0000 | TOPICAL_OINTMENT | Freq: Four times a day (QID) | CUTANEOUS | 0 refills | Status: DC
  Filled 2021-11-25: qty 30, 15d supply, fill #0

## 2021-11-25 NOTE — Telephone Encounter (Signed)
Requested Prescriptions  Pending Prescriptions Disp Refills   acyclovir ointment (ZOVIRAX) 5 % 30 g 0    Sig: Apply to affected areas of penis 4 (four) times daily.     Antimicrobials:  Antiviral Agents - Anti-Herpetic Passed - 11/25/2021  3:50 PM      Passed - Valid encounter within last 12 months    Recent Outpatient Visits           2 months ago Essential hypertension   [place], MD   4 months ago Uncontrolled hypertension   [place], MD   6 months ago [place], MD   8 months ago Essential hypertension   [place], MD   1 year ago Cervical radiculopathy   [place], MD       Future Appointments             In 4 weeks [name], MD [place]

## 2021-12-23 ENCOUNTER — Ambulatory Visit: Payer: Medicare Other | Admitting: Critical Care Medicine

## 2021-12-24 ENCOUNTER — Other Ambulatory Visit: Payer: Self-pay | Admitting: Critical Care Medicine

## 2021-12-24 ENCOUNTER — Encounter: Payer: Self-pay | Admitting: Critical Care Medicine

## 2021-12-24 ENCOUNTER — Other Ambulatory Visit: Payer: Self-pay

## 2021-12-24 ENCOUNTER — Ambulatory Visit: Payer: Medicare Other | Attending: Critical Care Medicine | Admitting: Critical Care Medicine

## 2021-12-24 VITALS — BP 114/73 | HR 103 | Temp 98.4°F | Resp 16 | Ht 61.0 in | Wt 164.0 lb

## 2021-12-24 DIAGNOSIS — R972 Elevated prostate specific antigen [PSA]: Secondary | ICD-10-CM | POA: Insufficient documentation

## 2021-12-24 DIAGNOSIS — I10 Essential (primary) hypertension: Secondary | ICD-10-CM | POA: Insufficient documentation

## 2021-12-24 DIAGNOSIS — R350 Frequency of micturition: Secondary | ICD-10-CM | POA: Insufficient documentation

## 2021-12-24 DIAGNOSIS — R300 Dysuria: Secondary | ICD-10-CM | POA: Diagnosis not present

## 2021-12-24 DIAGNOSIS — N4889 Other specified disorders of penis: Secondary | ICD-10-CM | POA: Diagnosis not present

## 2021-12-24 NOTE — Assessment & Plan Note (Signed)
Had prostate biopsies previously that were unrevealing

## 2021-12-24 NOTE — Progress Notes (Signed)
Established Patient Office Visit  Subjective:  Patient ID: Patrick Ortega, male    DOB: 08-10-1940  Age: 81 y.o. MRN: [id number]  CC:  Dysuria , penile pain  HPI 03/10/21 Patrick Ortega presents for primary care follow-up.  This patient was seen with [facility] interpreter audio Spanish andy [phone] Patient continues to have neck and shoulder pain.  He is being evaluated extensively by orthopedics and has follow-up appointments over the next 2 weeks for nerve conduction study and shoulder evaluations.  He continues to complain of the pain today and I reminded him and his son who is with him today who his name is Patrick Ortega these appointments are available and he should follow-up on them.  Another issue is he complains of irritation at the end of his penis burning on urination and difficulty urinating.  He been seen by urology previously was given high-dose Flomax and was told to follow-up expectantly on his PSA that was elevated at 7.0 he does not have follow-up appointments with urology currently.  He was last seen by urology December 2021.  05/24/21  Patient is seen today as a work in visit and the visit was assisted by [place] video interpreter [phone] Since the last visit patient has been seen by urology they performed a part prostate biopsy which was negative for malignancy he has been maintained on tamsulosin  Today though the patient comes in with burning sensation at the tip of his penis and on and around the uncircumcised area of the penis.  He has some blood intermittently from the head of the penis and sometimes in the urine.  He has some burning on urination. The patient also complains of neck discomfort but he underwent EMG nerve conduction study with orthopedics which did not show evidence of nerve root compression on the cervical spine.  He does not need injections in the neck area.  He did have shoulder work performed.  7/17 Patient seen in return visit visit was assisted by  Spanish video interpreter [name] [phone] Patient planes of neck pain and throat pain over the last week.  He states the lesion on his penis is improved but would like a refill on the Zovirax.  Note on arrival blood pressure is elevated 147/72. The patient has been up to now on the amlodipine 10 mg daily chlorthalidone 25 mg daily.  He states he has been compliant with this medication.   9/19 Patient seen in return follow-up Spanish interpreter [name] #[phone] assisted.  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  He has no other real complaints at this visit.  Unfortunately blood pressure on arrival elevated 146/69 and it turns out he never filled his amlodipine or his chlorthalidone we wrote for the last visit in July.  He has been taking the low-dose valsartan 80 mg daily.  12/21 Patient is seen in follow-up visit assisted by Spanish interpreter [name] [phone].  Patient states since being on gabapentin his hand numbness has not changed and he is having more balance and visual changes for 1 month.  He did also have to stop his blood pressure medicines when became quite dizzy.  Note off blood pressure medications he is 114/73 on arrival.  Patient is taking tamsulosin 0.8 mg daily and states this does help his prostate flow however he still not emptying his bladder fully and still has dysuria.  The acyclovir we applied to the end of his  penis has not been of any benefit to him.  Patient has no other complaints. Past Medical History:  Diagnosis Date   GERD (gastroesophageal reflux disease)    Hypertension     Past Surgical History:  Procedure Laterality Date   NO PAST SURGERIES      Family History  Problem Relation Age of Onset   Ulcers Mother     Social History   Socioeconomic History   Marital status: Married    Spouse name: Not on file   Number of children: Not on file   Years of education: Not on file    Highest education level: Not on file  Occupational History   Not on file  Tobacco Use   Smoking status: Former    Packs/day: 0.25    Years: 0.50    Total pack years: 0.13    Types: Cigarettes   Smokeless tobacco: Never  Vaping Use   Vaping Use: Never used  Substance and Sexual Activity   Alcohol use: Not Currently   Drug use: Not on file   Sexual activity: Yes  Other Topics Concern   Not on file  Social History Narrative   Not on file   Social Determinants of Health   Financial Resource Strain: Not on file  Food Insecurity: Not on file  Transportation Needs: Not on file  Physical Activity: Not on file  Stress: Not on file  Social Connections: Not on file  Intimate Partner Violence: Not on file    Outpatient Medications Prior to Visit  Medication Sig Dispense Refill   tamsulosin (FLOMAX) 0.4 MG CAPS capsule Take 2 capsules (0.8 mg total) by mouth daily. 60 capsule 3   acyclovir ointment (ZOVIRAX) 5 % Apply to affected areas of penis 4 (four) times daily. 30 g 0   gabapentin (NEURONTIN) 300 MG capsule Take 1 capsule (300 mg total) by mouth 3 (three) times daily. For numbness in hands 90 capsule 3   amLODipine (NORVASC) 10 MG tablet Take 1 tablet (10 mg total) by mouth daily. (Patient not taking: Reported on 12/24/2021) 90 tablet 2   chlorthalidone (HYGROTON) 25 MG tablet Take 1 tablet (25 mg total) by mouth daily. 90 tablet 2   diclofenac (VOLTAREN) 50 MG EC tablet Take 1 tablet (50 mg total) by mouth daily with breakfast. (Patient not taking: Reported on 12/24/2021) 30 tablet 2   tizanidine (ZANAFLEX) 2 MG capsule Take 1 capsule (2 mg total) by mouth 3 (three) times daily as needed for muscle spasms. (Patient not taking: Reported on 12/24/2021) 30 capsule 0   valsartan (DIOVAN) 80 MG tablet Take 1 tablet (80 mg total) by mouth daily. 90 tablet 3   No facility-administered medications prior to visit.    No Known Allergies  ROS Review of Systems  Constitutional:  Negative.   HENT:  Negative for ear pain, postnasal drip, rhinorrhea, sinus pressure, sore throat, trouble swallowing and voice change.   Eyes: Negative.   Respiratory: Negative.  Negative for apnea, cough, choking, chest tightness, shortness of breath, wheezing and stridor.   Cardiovascular: Negative.  Negative for chest pain, palpitations and leg swelling.  Gastrointestinal: Negative.  Negative for abdominal distention, abdominal pain, nausea and vomiting.  Genitourinary:  Positive for dysuria, hematuria, penile discharge and penile pain. Negative for difficulty urinating, frequency, penile swelling, scrotal swelling, testicular pain and urgency.  Musculoskeletal:  Positive for neck pain. Negative for arthralgias and myalgias.  Skin: Negative.  Negative for rash.  Allergic/Immunologic: Negative.  Negative for environmental  allergies and food allergies.  Neurological: Negative.  Negative for dizziness, syncope, weakness and headaches.  Hematological: Negative.  Negative for adenopathy. Does not bruise/bleed easily.  Psychiatric/Behavioral: Negative.  Negative for agitation and sleep disturbance. The patient is not nervous/anxious.       Objective:    Physical Exam Vitals reviewed.  Constitutional:      Appearance: Normal appearance. He is well-developed. He is not diaphoretic.  HENT:     Head: Normocephalic and atraumatic.     Nose: No nasal deformity, septal deviation, mucosal edema or rhinorrhea.     Right Sinus: No maxillary sinus tenderness or frontal sinus tenderness.     Left Sinus: No maxillary sinus tenderness or frontal sinus tenderness.     Mouth/Throat:     Mouth: Mucous membranes are moist.     Pharynx: No oropharyngeal exudate or posterior oropharyngeal erythema.  Eyes:     General: No scleral icterus.    Conjunctiva/sclera: Conjunctivae normal.     Pupils: Pupils are equal, round, and reactive to light.  Neck:     Thyroid: No thyromegaly.     Vascular: No carotid  bruit or JVD.     Trachea: Trachea normal. No tracheal tenderness or tracheal deviation.  Cardiovascular:     Rate and Rhythm: Normal rate and regular rhythm.     Chest Wall: PMI is not displaced.     Pulses: Normal pulses. No decreased pulses.     Heart sounds: Normal heart sounds, S1 normal and S2 normal. Heart sounds not distant. No murmur heard.    No systolic murmur is present.     No diastolic murmur is present.     No friction rub. No gallop. No S3 or S4 sounds.  Pulmonary:     Effort: No tachypnea, accessory muscle usage or respiratory distress.     Breath sounds: No stridor. No decreased breath sounds, wheezing, rhonchi or rales.  Chest:     Chest wall: No tenderness.  Abdominal:     General: Bowel sounds are normal. There is no distension.     Palpations: Abdomen is soft. Abdomen is not rigid.     Tenderness: There is no abdominal tenderness. There is no guarding or rebound.  Musculoskeletal:     Cervical back: Normal range of motion and neck supple. No edema, erythema or rigidity. No muscular tenderness. Normal range of motion.     Comments: Decreased rom of both shoulders.    Lymphadenopathy:     Head:     Right side of head: No submental or submandibular adenopathy.     Left side of head: No submental or submandibular adenopathy.     Cervical: No cervical adenopathy.  Skin:    General: Skin is warm and dry.     Coloration: Skin is not pale.     Findings: No rash.     Nails: There is no clubbing.  Neurological:     General: No focal deficit present.     Mental Status: He is alert and oriented to person, place, and time.     Sensory: No sensory deficit.  Psychiatric:        Mood and Affect: Mood normal.        Speech: Speech normal.        Behavior: Behavior normal.        Thought Content: Thought content normal.        Judgment: Judgment normal.     BP 114/73 (BP Location: Right Arm, Patient  Position: Sitting, Cuff Size: Normal)   Pulse (!) 103   Temp 98.4  F (36.9 C) (Oral)   Resp 16   Wt 164 lb (74.4 kg)   SpO2 96%   BMI 30.99 kg/m  Wt Readings from Last 3 Encounters:  12/24/21 164 lb (74.4 kg)  11/09/21 159 lb (72.1 kg)  09/22/21 153 lb 9.6 oz (69.7 kg)     Health Maintenance Due  Topic Date Due   Medicare Annual Wellness (AWV)  Never done   COVID-19 Vaccine (4 - 2023-24 season) 09/04/2021    There are no preventive care reminders to display for this patient.  Lab Results  Component Value Date   TSH 2.080 04/25/2019   Lab Results  Component Value Date   WBC 10.4 05/25/2021   HGB 15.1 05/25/2021   HCT 44.3 05/25/2021   MCV 86 05/25/2021   PLT 463 (H) 05/25/2021   Lab Results  Component Value Date   NA 140 03/10/2021   K 4.5 03/10/2021   CO2 27 03/10/2021   GLUCOSE 95 03/10/2021   BUN 16 03/10/2021   CREATININE 1.01 03/10/2021   BILITOT 0.5 03/10/2021   ALKPHOS 119 03/10/2021   AST 25 03/10/2021   ALT 18 03/10/2021   PROT 8.6 (H) 03/10/2021   ALBUMIN 5.0 (H) 03/10/2021   CALCIUM 10.5 (H) 03/10/2021   EGFR 75 03/10/2021   Lab Results  Component Value Date   CHOL 195 05/09/2019   Lab Results  Component Value Date   HDL 64 05/09/2019   Lab Results  Component Value Date   LDLCALC 112 (H) 05/09/2019   Lab Results  Component Value Date   TRIG 110 05/09/2019   Lab Results  Component Value Date   CHOLHDL 3.0 05/09/2019   No results found for: "HGBA1C"    Assessment & Plan:   Problem List Items Addressed This Visit       Cardiovascular and Mediastinum   Essential hypertension - Primary   Relevant Orders   Ambulatory referral to Urology     Other   Urinary frequency    Referral back to urology      Relevant Orders   Ambulatory referral to Urology   Elevated PSA    Had prostate biopsies previously that were unrevealing      Relevant Orders   Ambulatory referral to Urology   Dysuria    Burning on urination persisting not fully emptying bladder on tamsulosin we will send back to  urology for evaluations      Relevant Orders   Ambulatory referral to Urology  No orders of the defined types were placed in this encounter. Follow-up: No follow-ups on file.    Asencion Noble, MD

## 2021-12-24 NOTE — Assessment & Plan Note (Signed)
Burning on urination persisting not fully emptying bladder on tamsulosin we will send back to urology for evaluations

## 2021-12-24 NOTE — Assessment & Plan Note (Signed)
Referral back to urology

## 2021-12-24 NOTE — Patient Instructions (Addendum)
Stop all medications  except for tamsulosin take 2 capsules daily for your bladder  Referral to urology Dr. Louis Meckel will be made  you saw him previously  REturn Dr Joya Gaskins 3 months  Suspenda todos los medicamentos, excepto tamsulosina, tome 2 cpsulas al da para la vejiga.  Se har derivacin al Dr. Louis Meckel a urologa. Lo vio anteriormente.  [place] Dr. Joya Gaskins 3 meses

## 2021-12-24 NOTE — Progress Notes (Signed)
States the medication acyclovir is burning tip of penis States he takes medication for blood pressure

## 2021-12-25 ENCOUNTER — Other Ambulatory Visit: Payer: Self-pay

## 2021-12-25 MED ORDER — TAMSULOSIN HCL 0.4 MG PO CAPS
0.8000 mg | ORAL_CAPSULE | Freq: Every day | ORAL | 3 refills | Status: DC
  Filled 2021-12-25 – 2021-12-30 (×2): qty 60, 30d supply, fill #0
  Filled 2022-02-08: qty 60, 30d supply, fill #1

## 2021-12-30 ENCOUNTER — Other Ambulatory Visit: Payer: Self-pay

## 2022-01-21 ENCOUNTER — Ambulatory Visit: Payer: Medicare Other | Admitting: Physician Assistant

## 2022-02-02 ENCOUNTER — Other Ambulatory Visit (HOSPITAL_COMMUNITY): Payer: Self-pay

## 2022-02-02 DIAGNOSIS — N471 Phimosis: Secondary | ICD-10-CM | POA: Diagnosis not present

## 2022-02-02 DIAGNOSIS — R972 Elevated prostate specific antigen [PSA]: Secondary | ICD-10-CM | POA: Diagnosis not present

## 2022-02-02 MED ORDER — CLOBETASOL PROPIONATE 0.05 % EX CREA
1.0000 | TOPICAL_CREAM | CUTANEOUS | 1 refills | Status: DC
  Filled 2022-02-02: qty 15, 5d supply, fill #0

## 2022-02-03 ENCOUNTER — Other Ambulatory Visit: Payer: Self-pay

## 2022-02-08 ENCOUNTER — Other Ambulatory Visit: Payer: Self-pay

## 2022-02-09 ENCOUNTER — Other Ambulatory Visit: Payer: Self-pay

## 2022-02-18 ENCOUNTER — Other Ambulatory Visit: Payer: Self-pay

## 2022-02-18 ENCOUNTER — Ambulatory Visit: Payer: Medicare Other | Attending: Physician Assistant | Admitting: Physician Assistant

## 2022-02-18 ENCOUNTER — Encounter: Payer: Self-pay | Admitting: Physician Assistant

## 2022-02-18 VITALS — BP 184/88 | HR 66 | Wt 164.6 lb

## 2022-02-18 DIAGNOSIS — M79601 Pain in right arm: Secondary | ICD-10-CM | POA: Diagnosis not present

## 2022-02-18 DIAGNOSIS — R202 Paresthesia of skin: Secondary | ICD-10-CM | POA: Insufficient documentation

## 2022-02-18 DIAGNOSIS — Z79899 Other long term (current) drug therapy: Secondary | ICD-10-CM | POA: Insufficient documentation

## 2022-02-18 DIAGNOSIS — Z789 Other specified health status: Secondary | ICD-10-CM

## 2022-02-18 DIAGNOSIS — M542 Cervicalgia: Secondary | ICD-10-CM

## 2022-02-18 DIAGNOSIS — M5020 Other cervical disc displacement, unspecified cervical region: Secondary | ICD-10-CM | POA: Insufficient documentation

## 2022-02-18 MED ORDER — GABAPENTIN 100 MG PO CAPS
100.0000 mg | ORAL_CAPSULE | Freq: Three times a day (TID) | ORAL | 3 refills | Status: DC
  Filled 2022-02-18: qty 90, 30d supply, fill #0

## 2022-02-18 NOTE — Progress Notes (Signed)
Patient ID: Patrick Ortega, male   DOB: [birth year], 82 y.o.   MRN: [id number]   Tedford Mcgowen, is a 82 y.o. male  [id number]  [id number]  DOB - 1940-09-21  Chief Complaint  Patient presents with   Hypertension   Tingling    Head and neck        Subjective:   Patrick Ortega is a 82 y.o. male here today for continued pain in his neck that radiates into R arm certain ways he moves his neck.  It appears he was seen in November for this but says it has worsened.  No weakness or dropping things.  Says the muscle relaxer she gave him did not help.  The pain is more like tingling.  No difficulty swallowing.    No problems updated.  ALLERGIES: No Known Allergies  PAST MEDICAL HISTORY: Past Medical History:  Diagnosis Date   GERD (gastroesophageal reflux disease)    Hypertension     MEDICATIONS AT HOME: Prior to Admission medications   Medication Sig Start Date End Date Taking? Authorizing Provider  clobetasol cream (TEMOVATE) 0.05 % Apply to affected area 2 or 3 times daily. 02/02/22  Yes   gabapentin (NEURONTIN) 100 MG capsule Take 1 capsule (100 mg total) by mouth 3 (three) times daily. 02/18/22  Yes Argentina Donovan, PA-C  tamsulosin (FLOMAX) 0.4 MG CAPS capsule Take 2 capsules (0.8 mg total) by mouth daily. 12/25/21  Yes [name], MD    ROS: Neg HEENT Neg resp Neg cardiac Neg GI Neg GU Neg psych Neg neuro  Objective:   Vitals:   02/18/22 1635  BP: (!) 184/88  Pulse: 66  SpO2: 96%  Weight: 164 lb 9.6 oz (74.7 kg)   Exam General appearance : Awake, alert, not in any distress. Speech Clear. Not toxic looking HEENT: Atraumatic and Normocephalic Neck: Supple, no JVD. No cervical lymphadenopathy.  Chest: Good air entry bilaterally, CTAB.  No rales/rhonchi/wheezing CVS: S1 S2 regular, no murmurs.  No spiny tenderness of the c-spine Extremities: BUE with full S&ROM.  UE DTR= intact B. B/L Lower Ext shows no edema, both legs are warm to  touch Neurology: Awake alert, and oriented X 3, CN II-XII intact, Non focal Skin: No Rash  Data Review No results found for: "HGBA1C"  Assessment & Plan   1. Protrusion of cervical intervertebral disc Based on xrays done 11/2020 - DG Cervical Spine 2 or 3 views - Ambulatory referral to Neurosurgery  2. Neck pain-radiculopathy - DG Cervical Spine 2 or 3 views - Ambulatory referral to Neurosurgery  3. Paresthesia R arm - gabapentin (NEURONTIN) 100 MG capsule; Take 1 capsule (100 mg total) by mouth 3 (three) times daily.  Dispense: 90 capsule; Refill: 3  4. Language barrier [employer] interpreters "Casimer Bilis" used and additional time performing visit was required.     Return in about 1 month (around 03/19/2022) for Dr Joya Gaskins for chronic conditions.  The patient was given clear instructions to go to ER or return to medical center if symptoms don't improve, worsen or new problems develop. The patient verbalized understanding. The patient was told to call to get lab results if they haven't heard anything in the next week.      Freeman Caldron, PA-C [facility] [place], [place]   02/18/2022, 4:59 PM

## 2022-02-18 NOTE — Patient Instructions (Signed)
Radiculopata cervical Cervical Radiculopathy  La radiculopata cervical se presenta cuando un nervio del cuello (un nervio cervical) est comprimido o daado. Esta afeccin puede ocurrir debido a una lesin en la columna vertebral cervical (vrtebras) del cuello, o como parte del proceso de envejecimiento normal. La compresin de los nervios cervicales puede causar dolor o adormecimiento que se extiende desde el cuello hasta el brazo y los dedos de la [place]. Esta afeccin generalmente mejora con reposo. Si no mejora, tal vez sea necesario administrar un tratamiento. Cules son las causas? Esta afeccin puede ser causada por lo siguiente: Lesin en el cuello. Un abombamiento (hernia) discal. Espasmos musculares. Rigidez de los msculos del cuello debido al uso excesivo. Artritis. Fractura o degeneracin de los huesos y las articulaciones de la columna (espondiloartrosis) debido al envejecimiento. Espolones seos que pueden formarse cerca de los nervios cervicales. Cules son los signos o sntomas? Los sntomas de esta afeccin incluyen: Social research officer, government. El dolor puede extenderse desde el cuello hasta el brazo y [place]. El dolor puede ser intenso o [place]. Puede empeorar cuando mueve el cuello. Adormecimiento u hormigueo en el brazo o la mano. Debilidad en el brazo y la mano afectados, en casos graves. Cmo se diagnostica? Esta afeccin se puede diagnosticar en funcin de los sntomas, la historia clnica y los antecedentes mdicos. Tambin pueden hacerle estudios, que incluyen los siguientes: Radiografas. Exploracin por tomografa computarizada (TC). Resonancia magntica (RM). Electromiograma (EMG). Pruebas de conduccin nerviosa. Cmo se trata? En muchos casos, no se requiere tratamiento para esta afeccin. Con reposo, esta suele mejorar con [employer]. Si es Arts development officer, las opciones pueden incluir lo siguiente: Usar un collarn cervical blando durante perodos  cortos. Hacer fisioterapia para fortalecer los msculos del cuello. Usar medicamentos. Estos pueden incluir antiinflamatorios no esteroideos (AINE), como ibuprofeno, o corticoesteroides orales. Aplicarse inyecciones en la columna vertebral, en los casos graves. Someterse a [place]. Esto puede ser necesario si otros tratamientos no son eficaces. Segn la causa de esta afeccin, podrn implementarse diferentes tipos de [place]. Siga estas indicaciones en su casa: Si tiene un collarn cervical: selo como se lo haya indicado el mdico. Quteselo solamente como se lo haya indicado el mdico. Pregntele al mdico si puede quitarse el collarn cervical para baarse e higienizarse. Si lo autorizan a Programmer, systems para baarse o higienizarse: Siga las instrucciones del mdico acerca de cmo quitarse el collarn de manera segura. Para limpiar el collarn, psele un pao con agua y [place], y squelo bien. Quite las almohadillas desmontables del collarn, si las tiene, cada 1 o 2 das y lvelas a mano con agua y [place]. Djelas que se sequen por completo antes de volver a ponerlas en el collarn. Contrlese la piel debajo del collarn para ver si hay irritacin o llagas. Si presenta alguna de estas, informe a su mdico. Control del dolor     Use los medicamentos de venta libre y los recetados solamente como se lo haya indicado el mdico. Si se lo indican, aplique hielo sobre la zona afectada. Para hacer esto: Si tiene un collarn cervical blando, quteselo como se lo haya indicado el mdico. Ponga el hielo en una bolsa plstica. Coloque una toalla entre la piel y Therapist, nutritional. Aplique el hielo durante 20 minutos, 2 o 3 veces por da. Retire el hielo si la piel se pone de color rojo brillante. Esto es [employer]. Si no puede sentir dolor, calor o fro, tiene un mayor riesgo de que se dae la zona.  Si aplicarse hielo no le Ship broker, intente Multimedia programmer. Use la fuente de calor que  el mdico le recomiende, como una compresa de calor hmedo o una almohadilla trmica. Coloque una toalla entre la piel y la fuente de Freight forwarder. Aplique calor durante 20 a 30 minutos. Retire la fuente de calor si la piel se pone de color rojo brillante. Esto es especialmente importante si no puede sentir dolor, calor o fro. Corre un mayor riesgo de sufrir quemaduras. Intente darse un masaje suave en el cuello y el hombro para ayudar a Public house manager los sntomas. Actividad Descanse todo lo que sea necesario. Retome sus actividades normales como se lo haya indicado el mdico. Pregntele al mdico qu actividades son seguras para usted. Realice ejercicios de elongacin y fortalecimiento como se lo hayan indicado el mdico o el fisioterapeuta. Es posible que Dance movement psychotherapist objetos. Pregntele al mdico cunto peso puede levantar sin correr [employer]. Indicaciones generales Use una almohada plana para dormir. No conduzca mientras [place] un collarn cervical. Si no tiene un collarn cervical, pregntele al mdico si es seguro que conduzca durante el proceso de curacin del cuello. Pregntele al mdico si el medicamento recetado le impide conducir o usar [place]. No consuma ningn producto que contenga nicotina o tabaco. Estos productos incluyen cigarrillos, tabaco para Higher education careers adviser y aparatos de vapeo, como los Psychologist, sport and exercise. Si necesita ayuda para dejar de consumir estos productos, consulte al mdico. Concurra a todas las visitas de seguimiento. Esto es importante. Comunquese con un mdico si: La afeccin no mejora con tratamiento. Solicite ayuda de inmediato si: El dolor se intensifica mucho y no se Engineer, production con los medicamentos. Siente debilidad o adormecimiento en la mano, el brazo, el rostro o la pierna. Tiene fiebre alta. Tiene rigidez de cuello. Pierde el control de la vejiga o los intestinos (tiene incontinencia). Tiene dificultad para caminar, mantener el equilibrio o hablar. Resumen La  radiculopata cervical se presenta cuando un nervio del cuello est comprimido o daado. Un nervio puede pinzarse por un abultamiento discal, artritis, espasmos musculares o una lesin en el cuello. Los sntomas Research scientist (physical sciences), hormigueo o adormecimiento que se irradia desde el cuello hacia el brazo o la [place]. En los casos graves, tambin puede presentarse debilidad. El tratamiento puede incluir reposo, fisioterapia y usar un collarn cervical. Pueden recetarle medicamentos para Best boy. En casos graves, tal vez haya que aplicar inyecciones o realizar [place]. Esta informacin no tiene Marine scientist el consejo del mdico. Asegrese de hacerle al mdico cualquier pregunta que tenga. Document Revised: 07/30/2020 Document Reviewed: 07/30/2020 Elsevier Patient Education  [place].

## 2022-03-28 NOTE — Progress Notes (Unsigned)
Established Patient Office Visit  Subjective:  Patient ID: Patrick Ortega, male    DOB: 08-10-1940  Age: 82 y.o. MRN: [id number]  CC:  Dysuria , penile pain  HPI 03/10/21 [name] presents for primary care follow-up.  This patient was seen with [facility] interpreter audio Spanish andy [phone] Patient continues to have neck and shoulder pain.  He is being evaluated extensively by orthopedics and has follow-up appointments over the next 2 weeks for nerve conduction study and shoulder evaluations.  He continues to complain of the pain today and I reminded him and his son who is with him today who his name is Patrick Ortega these appointments are available and he should follow-up on them.  Another issue is he complains of irritation at the end of his penis burning on urination and difficulty urinating.  He been seen by urology previously was given high-dose Flomax and was told to follow-up expectantly on his PSA that was elevated at 7.0 he does not have follow-up appointments with urology currently.  He was last seen by urology December 2021.  05/24/21  Patient is seen today as a work in visit and the visit was assisted by [place] video interpreter [phone] Since the last visit patient has been seen by urology they performed a part prostate biopsy which was negative for malignancy he has been maintained on tamsulosin  Today though the patient comes in with burning sensation at the tip of his penis and on and around the uncircumcised area of the penis.  He has some blood intermittently from the head of the penis and sometimes in the urine.  He has some burning on urination. The patient also complains of neck discomfort but he underwent EMG nerve conduction study with orthopedics which did not show evidence of nerve root compression on the cervical spine.  He does not need injections in the neck area.  He did have shoulder work performed.  7/17 Patient seen in return visit visit was assisted by  Spanish video interpreter [name] [phone] Patient planes of neck pain and throat pain over the last week.  He states the lesion on his penis is improved but would like a refill on the Zovirax.  Note on arrival blood pressure is elevated 147/72. The patient has been up to now on the amlodipine 10 mg daily chlorthalidone 25 mg daily.  He states he has been compliant with this medication.   9/19 Patient seen in return follow-up Spanish interpreter [name] #[phone] assisted.  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  He has no other real complaints at this visit.  Unfortunately blood pressure on arrival elevated 146/69 and it turns out he never filled his amlodipine or his chlorthalidone we wrote for the last visit in July.  He has been taking the low-dose valsartan 80 mg daily.  12/21 Patient is seen in follow-up visit assisted by Spanish interpreter [name] [phone].  Patient states since being on gabapentin his hand numbness has not changed and he is having more balance and visual changes for 1 month.  He did also have to stop his blood pressure medicines when became quite dizzy.  Note off blood pressure medications he is 114/73 on arrival.  Patient is taking tamsulosin 0.8 mg daily and states this does help his prostate flow however he still not emptying his bladder fully and still has dysuria.  The acyclovir we applied to the end of his  penis has not been of any benefit to him.  Patient has no other complaints.  03/30/22 This patient was seen previously and February by [facility] after I saw the patient in December.  Today's visit assisted by Spanish video interpreter Harrell Gave [phone]  Patient continues to planing of penis pain did see urology the cream that was prescribed caused pain and he quit taking it he would like to have the acyclovir or back that we gave him previously.  On arrival blood pressure elevated 190/92  recheck remains elevated.  Patient does have cervical disc disease but his neck pain is better.  He states Neurontin did not help him he is not taking this.  Patient has been on acyclovir in the past with some improvement.  He is not taking the tamsulosin needs to be refilled.  Past Medical History:  Diagnosis Date   GERD (gastroesophageal reflux disease)    Hypertension     Past Surgical History:  Procedure Laterality Date   NO PAST SURGERIES      Family History  Problem Relation Age of Onset   Ulcers Mother     Social History   Socioeconomic History   Marital status: Married    Spouse name: Not on file   Number of children: Not on file   Years of education: Not on file   Highest education level: Not on file  Occupational History   Not on file  Tobacco Use   Smoking status: Former    Packs/day: 0.25    Years: 0.50    Additional pack years: 0.00    Total pack years: 0.13    Types: Cigarettes   Smokeless tobacco: Never  Vaping Use   Vaping Use: Never used  Substance and Sexual Activity   Alcohol use: Not Currently   Drug use: Not on file   Sexual activity: Yes  Other Topics Concern   Not on file  Social History Narrative   Not on file   Social Determinants of Health   Financial Resource Strain: Not on file  Food Insecurity: Not on file  Transportation Needs: Not on file  Physical Activity: Not on file  Stress: Not on file  Social Connections: Not on file  Intimate Partner Violence: Not on file    Outpatient Medications Prior to Visit  Medication Sig Dispense Refill   tamsulosin (FLOMAX) 0.4 MG CAPS capsule Take 2 capsules (0.8 mg total) by mouth daily. 60 capsule 3   clobetasol cream (TEMOVATE) 0.05 % Apply to affected area 2 or 3 times daily. (Patient not taking: Reported on 03/30/2022) 45 g 1   gabapentin (NEURONTIN) 100 MG capsule Take 1 capsule (100 mg total) by mouth 3 (three) times daily. (Patient not taking: Reported on 03/30/2022) 90 capsule 3    No facility-administered medications prior to visit.    No Known Allergies  ROS Review of Systems  Constitutional: Negative.   HENT:  Negative for ear pain, postnasal drip, rhinorrhea, sinus pressure, sore throat, trouble swallowing and voice change.   Eyes: Negative.   Respiratory: Negative.  Negative for apnea, cough, choking, chest tightness, shortness of breath, wheezing and stridor.   Cardiovascular: Negative.  Negative for chest pain, palpitations and leg swelling.  Gastrointestinal: Negative.  Negative for abdominal distention, abdominal pain, nausea and vomiting.  Genitourinary:  Positive for penile pain. Negative for difficulty urinating, dysuria, frequency, hematuria, penile discharge, penile swelling, scrotal swelling, testicular pain and urgency.  Musculoskeletal:  Positive for neck pain. Negative for arthralgias and myalgias.  Skin: Negative.  Negative for rash.  Allergic/Immunologic: Negative.  Negative for environmental allergies and food allergies.  Neurological: Negative.  Negative for dizziness, syncope, weakness and headaches.  Hematological: Negative.  Negative for adenopathy. Does not bruise/bleed easily.  Psychiatric/Behavioral: Negative.  Negative for agitation and sleep disturbance. The patient is not nervous/anxious.       Objective:    Physical Exam Vitals reviewed.  Constitutional:      Appearance: Normal appearance. He is well-developed. He is not diaphoretic.  HENT:     Head: Normocephalic and atraumatic.     Nose: No nasal deformity, septal deviation, mucosal edema or rhinorrhea.     Right Sinus: No maxillary sinus tenderness or frontal sinus tenderness.     Left Sinus: No maxillary sinus tenderness or frontal sinus tenderness.     Mouth/Throat:     Mouth: Mucous membranes are moist.     Pharynx: No oropharyngeal exudate or posterior oropharyngeal erythema.  Eyes:     General: No scleral icterus.    Conjunctiva/sclera: Conjunctivae normal.      Pupils: Pupils are equal, round, and reactive to light.  Neck:     Thyroid: No thyromegaly.     Vascular: No carotid bruit or JVD.     Trachea: Trachea normal. No tracheal tenderness or tracheal deviation.  Cardiovascular:     Rate and Rhythm: Normal rate and regular rhythm.     Chest Wall: PMI is not displaced.     Pulses: Normal pulses. No decreased pulses.     Heart sounds: Normal heart sounds, S1 normal and S2 normal. Heart sounds not distant. No murmur heard.    No systolic murmur is present.     No diastolic murmur is present.     No friction rub. No gallop. No S3 or S4 sounds.  Pulmonary:     Effort: No tachypnea, accessory muscle usage or respiratory distress.     Breath sounds: No stridor. No decreased breath sounds, wheezing, rhonchi or rales.  Chest:     Chest wall: No tenderness.  Abdominal:     General: Bowel sounds are normal. There is no distension.     Palpations: Abdomen is soft. Abdomen is not rigid.     Tenderness: There is no abdominal tenderness. There is no guarding or rebound.  Musculoskeletal:     Cervical back: Normal range of motion and neck supple. No edema, erythema or rigidity. No muscular tenderness. Normal range of motion.     Comments: Decreased rom of both shoulders.    Lymphadenopathy:     Head:     Right side of head: No submental or submandibular adenopathy.     Left side of head: No submental or submandibular adenopathy.     Cervical: No cervical adenopathy.  Skin:    General: Skin is warm and dry.     Coloration: Skin is not pale.     Findings: No rash.     Nails: There is no clubbing.  Neurological:     General: No focal deficit present.     Mental Status: He is alert and oriented to person, place, and time.     Sensory: No sensory deficit.  Psychiatric:        Mood and Affect: Mood normal.        Speech: Speech normal.        Behavior: Behavior normal.        Thought Content: Thought content normal.        Judgment:  Judgment normal.      BP (!) 191/92 (BP Location: Right Arm, Patient Position: Sitting, Cuff Size: Normal)   Pulse 86   Wt 168 lb (76.2 kg)   SpO2 93%   BMI 31.74 kg/m  Wt Readings from Last 3 Encounters:  03/30/22 168 lb (76.2 kg)  02/18/22 164 lb 9.6 oz (74.7 kg)  12/24/21 164 lb (74.4 kg)     Health Maintenance Due  Topic Date Due   Medicare Annual Wellness (AWV)  Never done   COVID-19 Vaccine (4 - 2023-24 season) 09/04/2021    There are no preventive care reminders to display for this patient.  Lab Results  Component Value Date   TSH 2.080 04/25/2019   Lab Results  Component Value Date   WBC 10.4 05/25/2021   HGB 15.1 05/25/2021   HCT 44.3 05/25/2021   MCV 86 05/25/2021   PLT 463 (H) 05/25/2021   Lab Results  Component Value Date   NA 140 03/10/2021   K 4.5 03/10/2021   CO2 27 03/10/2021   GLUCOSE 95 03/10/2021   BUN 16 03/10/2021   CREATININE 1.01 03/10/2021   BILITOT 0.5 03/10/2021   ALKPHOS 119 03/10/2021   AST 25 03/10/2021   ALT 18 03/10/2021   PROT 8.6 (H) 03/10/2021   ALBUMIN 5.0 (H) 03/10/2021   CALCIUM 10.5 (H) 03/10/2021   EGFR 75 03/10/2021   Lab Results  Component Value Date   CHOL 195 05/09/2019   Lab Results  Component Value Date   HDL 64 05/09/2019   Lab Results  Component Value Date   LDLCALC 112 (H) 05/09/2019   Lab Results  Component Value Date   TRIG 110 05/09/2019   Lab Results  Component Value Date   CHOLHDL 3.0 05/09/2019   No results found for: "HGBA1C"    Assessment & Plan:   Problem List Items Addressed This Visit       Cardiovascular and Mediastinum   Essential hypertension - Primary    Hypertension not well-controlled will begin amlodipine 10 mg daily and valsartan daily Patient return in 1 month for blood pressure recheck      Relevant Medications   amLODipine (NORVASC) 10 MG tablet   valsartan (DIOVAN) 160 MG tablet   Other Relevant Orders   Comprehensive metabolic panel     Other   Urinary frequency    Has  BPH we will reorder tamsulosin      Elevated PSA    Monitor      HSV (herpes simplex virus) infection    Continues with penile dysfunction will order topical acyclovir again to the penis      Relevant Medications   acyclovir ointment (ZOVIRAX) 5 %   Neck pain    Monitor      Meds ordered this encounter  Medications   acyclovir ointment (ZOVIRAX) 5 %    Sig: Apply 1 Application topically in the morning and at bedtime.    Dispense:  30 g    Refill:  2   amLODipine (NORVASC) 10 MG tablet    Sig: Take 1 tablet (10 mg total) by mouth daily.    Dispense:  90 tablet    Refill:  2   valsartan (DIOVAN) 160 MG tablet    Sig: Take 1 tablet (160 mg total) by mouth daily.    Dispense:  90 tablet    Refill:  3   tamsulosin (FLOMAX) 0.4 MG CAPS capsule    Sig: Take 2 capsules (0.8 mg total)  by mouth daily.    Dispense:  60 capsule    Refill:  3    Future refill  Follow-up: Return in about 1 month (around 04/30/2022) for htn.   30 minutes needed extra time needed because of language barrier Asencion Noble, MD

## 2022-03-30 ENCOUNTER — Ambulatory Visit: Payer: Medicare Other | Attending: Critical Care Medicine | Admitting: Critical Care Medicine

## 2022-03-30 ENCOUNTER — Encounter: Payer: Self-pay | Admitting: Critical Care Medicine

## 2022-03-30 ENCOUNTER — Other Ambulatory Visit: Payer: Self-pay

## 2022-03-30 VITALS — BP 191/92 | HR 86 | Wt 168.0 lb

## 2022-03-30 DIAGNOSIS — I10 Essential (primary) hypertension: Secondary | ICD-10-CM

## 2022-03-30 DIAGNOSIS — M25519 Pain in unspecified shoulder: Secondary | ICD-10-CM | POA: Insufficient documentation

## 2022-03-30 DIAGNOSIS — R300 Dysuria: Secondary | ICD-10-CM | POA: Diagnosis not present

## 2022-03-30 DIAGNOSIS — R972 Elevated prostate specific antigen [PSA]: Secondary | ICD-10-CM

## 2022-03-30 DIAGNOSIS — M542 Cervicalgia: Secondary | ICD-10-CM

## 2022-03-30 DIAGNOSIS — N4889 Other specified disorders of penis: Secondary | ICD-10-CM | POA: Insufficient documentation

## 2022-03-30 DIAGNOSIS — B009 Herpesviral infection, unspecified: Secondary | ICD-10-CM | POA: Diagnosis not present

## 2022-03-30 DIAGNOSIS — R350 Frequency of micturition: Secondary | ICD-10-CM

## 2022-03-30 MED ORDER — ACYCLOVIR 5 % EX OINT
1.0000 | TOPICAL_OINTMENT | Freq: Two times a day (BID) | CUTANEOUS | 2 refills | Status: DC
  Filled 2022-03-30: qty 30, 30d supply, fill #0

## 2022-03-30 MED ORDER — AMLODIPINE BESYLATE 10 MG PO TABS
10.0000 mg | ORAL_TABLET | Freq: Every day | ORAL | 2 refills | Status: DC
  Filled 2022-03-30: qty 90, 90d supply, fill #0

## 2022-03-30 MED ORDER — TAMSULOSIN HCL 0.4 MG PO CAPS
0.8000 mg | ORAL_CAPSULE | Freq: Every day | ORAL | 3 refills | Status: DC
  Filled 2022-03-30: qty 60, 30d supply, fill #0
  Filled 2022-05-12: qty 60, 30d supply, fill #1

## 2022-03-30 MED ORDER — VALSARTAN 160 MG PO TABS
160.0000 mg | ORAL_TABLET | Freq: Every day | ORAL | 3 refills | Status: DC
  Filled 2022-03-30: qty 90, 90d supply, fill #0

## 2022-03-30 NOTE — Assessment & Plan Note (Signed)
Monitor

## 2022-03-30 NOTE — Assessment & Plan Note (Signed)
Has BPH we will reorder tamsulosin

## 2022-03-30 NOTE — Assessment & Plan Note (Signed)
Hypertension not well-controlled will begin amlodipine 10 mg daily and valsartan daily Patient return in 1 month for blood pressure recheck

## 2022-03-30 NOTE — Patient Instructions (Addendum)
Start acyclovir cream to the end of penis twice daily  Stay on tamsulosin 2 capsules daily  Start amlodipine 1 daily and valsartan 1 daily for blood pressure Obtain labs today  Return to Dr. Joya Gaskins 1 month  Empiece con crema de aciclovir hasta [place].  Mantngase con tamsulosina 2 cpsulas al da.  Comience con amlodipino 1 da y valsartn 1 da para la presin arterial.  Regresar al Dr. Joya Gaskins 1 mes

## 2022-03-30 NOTE — Assessment & Plan Note (Signed)
Continues with penile dysfunction will order topical acyclovir again to the penis

## 2022-03-31 LAB — COMPREHENSIVE METABOLIC PANEL
ALT: 16 IU/L (ref 0–44)
AST: 21 IU/L (ref 0–40)
Albumin/Globulin Ratio: 1.4 (ref 1.2–2.2)
Albumin: 4.6 g/dL (ref 3.7–4.7)
Alkaline Phosphatase: 117 IU/L (ref 44–121)
BUN/Creatinine Ratio: 21 (ref 10–24)
BUN: 18 mg/dL (ref 8–27)
Bilirubin Total: 0.6 mg/dL (ref 0.0–1.2)
CO2: 19 mmol/L — ABNORMAL LOW (ref 20–29)
Calcium: 10.1 mg/dL (ref 8.6–10.2)
Chloride: 101 mmol/L (ref 96–106)
Creatinine, Ser: 0.85 mg/dL (ref 0.76–1.27)
Globulin, Total: 3.3 g/dL (ref 1.5–4.5)
Glucose: 120 mg/dL — ABNORMAL HIGH (ref 70–99)
Potassium: 4.2 mmol/L (ref 3.5–5.2)
Sodium: 140 mmol/L (ref 134–144)
Total Protein: 7.9 g/dL (ref 6.0–8.5)
eGFR: 87 mL/min/{1.73_m2} (ref >59)

## 2022-03-31 NOTE — Progress Notes (Signed)
Let pt know labs normal  Pls contact [facility] and get his last OV note so I can review

## 2022-04-01 ENCOUNTER — Telehealth: Payer: Self-pay

## 2022-04-01 NOTE — Telephone Encounter (Signed)
-----   Message from [name], MD sent at 03/31/2022  8:18 AM EDT ----- Let pt know labs normal  Pls contact [facility] and get his last OV note so I can review

## 2022-04-01 NOTE — Telephone Encounter (Signed)
Pt was called and vm was left, Information has been sent to nurse pool.   Interpreter id [id number]

## 2022-04-16 ENCOUNTER — Telehealth: Payer: Self-pay | Admitting: Critical Care Medicine

## 2022-04-16 NOTE — Telephone Encounter (Signed)
Patient came in this morning was requesting to get a referral to the orthopedic.

## 2022-04-19 ENCOUNTER — Telehealth: Payer: Self-pay | Admitting: Critical Care Medicine

## 2022-04-19 NOTE — Telephone Encounter (Signed)
Contacted Kaison Pas to schedule their annual wellness visit. Appointment made for 04/22/22.  Rudell Cobb AWV direct phone # [phone]

## 2022-04-22 ENCOUNTER — Ambulatory Visit: Payer: Medicare Other | Attending: Critical Care Medicine

## 2022-04-22 VITALS — Ht 61.0 in | Wt 160.0 lb

## 2022-04-22 DIAGNOSIS — Z0000 Encounter for general adult medical examination without abnormal findings: Secondary | ICD-10-CM | POA: Diagnosis not present

## 2022-04-22 NOTE — Patient Instructions (Signed)
Patrick Ortega , Thank you for taking time to come for your Medicare Wellness Visit. I appreciate your ongoing commitment to your health goals. Please review the following plan we discussed and let me know if I can assist you in the future.   These are the goals we discussed:  Goals      Patient Stated     04/22/2022, no goals        This is a list of the screening recommended for you and due dates:  Health Maintenance  Topic Date Due   COVID-19 Vaccine (4 - 2023-24 season) 09/04/2021   Flu Shot  08/05/2022   Medicare Annual Wellness Visit  04/22/2023   DTaP/Tdap/Td vaccine (2 - Td or Tdap) 12/09/2029   Pneumonia Vaccine  Completed   HPV Vaccine  Aged Out   Zoster (Shingles) Vaccine  Discontinued    Advanced directives: Advance directive discussed with you today.   Conditions/risks identified: none  Next appointment: Follow up in one year for your annual wellness visit.   Preventive Care 18 Years and Older, Male  Preventive care refers to lifestyle choices and visits with your health care provider that can promote health and wellness. What does preventive care include? A yearly physical exam. This is also called an annual well check. Dental exams once or twice a year. Routine eye exams. Ask your health care provider how often you should have your eyes checked. Personal lifestyle choices, including: Daily care of your teeth and gums. Regular physical activity. Eating a healthy diet. Avoiding tobacco and drug use. Limiting alcohol use. Practicing safe sex. Taking low doses of aspirin every day. Taking vitamin and mineral supplements as recommended by your health care provider. What happens during an annual well check? The services and screenings done by your health care provider during your annual well check will depend on your age, overall health, lifestyle risk factors, and family history of disease. Counseling  Your health care provider may ask you questions about  your: Alcohol use. Tobacco use. Drug use. Emotional well-being. Home and relationship well-being. Sexual activity. Eating habits. History of falls. Memory and ability to understand (cognition). Work and work Astronomer. Screening  You may have the following tests or measurements: Height, weight, and BMI. Blood pressure. Lipid and cholesterol levels. These may be checked every 5 years, or more frequently if you are over 52 years old. Skin check. Lung cancer screening. You may have this screening every year starting at age 56 if you have a 30-pack-year history of smoking and currently smoke or have quit within the past 15 years. Fecal occult blood test (FOBT) of the stool. You may have this test every year starting at age 75. Flexible sigmoidoscopy or colonoscopy. You may have a sigmoidoscopy every 5 years or a colonoscopy every 10 years starting at age 60. Prostate cancer screening. Recommendations will vary depending on your family history and other risks. Hepatitis C blood test. Hepatitis B blood test. Sexually transmitted disease (STD) testing. Diabetes screening. This is done by checking your blood sugar (glucose) after you have not eaten for a while (fasting). You may have this done every 1-3 years. Abdominal aortic aneurysm (AAA) screening. You may need this if you are a current or former smoker. Osteoporosis. You may be screened starting at age 19 if you are at high risk. Talk with your health care provider about your test results, treatment options, and if necessary, the need for more tests. Vaccines  Your health care provider may recommend  certain vaccines, such as: Influenza vaccine. This is recommended every year. Tetanus, diphtheria, and acellular pertussis (Tdap, Td) vaccine. You may need a Td booster every 10 years. Zoster vaccine. You may need this after age 56. Pneumococcal 13-valent conjugate (PCV13) vaccine. One dose is recommended after age 37. Pneumococcal  polysaccharide (PPSV23) vaccine. One dose is recommended after age 23. Talk to your health care provider about which screenings and vaccines you need and how often you need them. This information is not intended to replace advice given to you by your health care provider. Make sure you discuss any questions you have with your health care provider. Document Released: 01/17/2015 Document Revised: 09/10/2015 Document Reviewed: 10/22/2014 Elsevier Interactive Patient Education  2017 [place] Prevention in the Home Falls can cause injuries. They can happen to people of all ages. There are many things you can do to make your home safe and to help prevent falls. What can I do on the outside of my home? Regularly fix the edges of walkways and driveways and fix any cracks. Remove anything that might make you trip as you walk through a door, such as a raised step or threshold. Trim any bushes or trees on the path to your home. Use bright outdoor lighting. Clear any walking paths of anything that might make someone trip, such as rocks or tools. Regularly check to see if handrails are loose or broken. Make sure that both sides of any steps have handrails. Any raised decks and porches should have guardrails on the edges. Have any leaves, snow, or ice cleared regularly. Use sand or salt on walking paths during winter. Clean up any spills in your garage right away. This includes oil or grease spills. What can I do in the bathroom? Use night lights. Install grab bars by the toilet and in the tub and shower. Do not use towel bars as grab bars. Use non-skid mats or decals in the tub or shower. If you need to sit down in the shower, use a plastic, non-slip stool. Keep the floor dry. Clean up any water that spills on the floor as soon as it happens. Remove soap buildup in the tub or shower regularly. Attach bath mats securely with double-sided non-slip rug tape. Do not have throw rugs and other  things on the floor that can make you trip. What can I do in the bedroom? Use night lights. Make sure that you have a light by your bed that is easy to reach. Do not use any sheets or blankets that are too big for your bed. They should not hang down onto the floor. Have a firm chair that has side arms. You can use this for support while you get dressed. Do not have throw rugs and other things on the floor that can make you trip. What can I do in the kitchen? Clean up any spills right away. Avoid walking on wet floors. Keep items that you use a lot in easy-to-reach places. If you need to reach something above you, use a strong step stool that has a grab bar. Keep electrical cords out of the way. Do not use floor polish or wax that makes floors slippery. If you must use wax, use non-skid floor wax. Do not have throw rugs and other things on the floor that can make you trip. What can I do with my stairs? Do not leave any items on the stairs. Make sure that there are handrails on both sides of the  stairs and use them. Fix handrails that are broken or loose. Make sure that handrails are as long as the stairways. Check any carpeting to make sure that it is firmly attached to the stairs. Fix any carpet that is loose or worn. Avoid having throw rugs at the top or bottom of the stairs. If you do have throw rugs, attach them to the floor with carpet tape. Make sure that you have a light switch at the top of the stairs and the bottom of the stairs. If you do not have them, ask someone to add them for you. What else can I do to help prevent falls? Wear shoes that: Do not have high heels. Have rubber bottoms. Are comfortable and fit you well. Are closed at the toe. Do not wear sandals. If you use a stepladder: Make sure that it is fully opened. Do not climb a closed stepladder. Make sure that both sides of the stepladder are locked into place. Ask someone to hold it for you, if possible. Clearly  mark and make sure that you can see: Any grab bars or handrails. First and last steps. Where the edge of each step is. Use tools that help you move around (mobility aids) if they are needed. These include: Canes. Walkers. Scooters. Crutches. Turn on the lights when you go into a dark area. Replace any light bulbs as soon as they burn out. Set up your furniture so you have a clear path. Avoid moving your furniture around. If any of your floors are uneven, fix them. If there are any pets around you, be aware of where they are. Review your medicines with your doctor. Some medicines can make you feel dizzy. This can increase your chance of falling. Ask your doctor what other things that you can do to help prevent falls. This information is not intended to replace advice given to you by your health care provider. Make sure you discuss any questions you have with your health care provider. Document Released: 10/17/2008 Document Revised: 05/29/2015 Document Reviewed: 01/25/2014 Elsevier Interactive Patient Education  2017 [employer].

## 2022-04-22 NOTE — Progress Notes (Signed)
I connected with  Patrick Ortega on 04/22/22 by a audio enabled telemedicine application and verified that I am speaking with the correct person using two identifiers. Interpreter Gordy Councilman was also on the call  Patient Location: Home  Provider Location: Office/Clinic  I discussed the limitations of evaluation and management by telemedicine. The patient expressed understanding and agreed to proceed.  Subjective:   Patrick Ortega is a 82 y.o. male who presents for an Initial Medicare Annual Wellness Visit.  Review of Systems     Cardiac Risk Factors include: advanced age (>67men, >70 women);hypertension;male gender     Objective:    Today's Vitals   04/22/22 1512  Weight: 160 lb (72.6 kg)  Height: 5\' 1"  (1.549 m)   Body mass index is 30.23 kg/m.     04/22/2022    3:23 PM 06/07/2019    7:52 AM 08/04/2016    9:09 AM 06/24/2014    4:00 PM 06/24/2014    3:55 PM  Advanced Directives  Does Patient Have a Medical Advance Directive? No No No No No  Would patient like information on creating a medical advance directive?  No - Patient declined  No - patient declined information     Current Medications (verified) Outpatient Encounter Medications as of 04/22/2022  Medication Sig   acyclovir ointment (ZOVIRAX) 5 % Apply 1 Application topically in the morning and at bedtime.   amLODipine (NORVASC) 10 MG tablet Take 1 tablet (10 mg total) by mouth daily.   tamsulosin (FLOMAX) 0.4 MG CAPS capsule Take 2 capsules (0.8 mg total) by mouth daily.   valsartan (DIOVAN) 160 MG tablet Take 1 tablet (160 mg total) by mouth daily.   clobetasol cream (TEMOVATE) 0.05 % Apply to affected area 2 or 3 times daily. (Patient not taking: Reported on 03/30/2022)   gabapentin (NEURONTIN) 100 MG capsule Take 1 capsule (100 mg total) by mouth 3 (three) times daily. (Patient not taking: Reported on 03/30/2022)   No facility-administered encounter medications on file as of 04/22/2022.    Allergies  (verified) Patient has no known allergies.   History: Past Medical History:  Diagnosis Date   GERD (gastroesophageal reflux disease)    Hypertension    Past Surgical History:  Procedure Laterality Date   NO PAST SURGERIES     Family History  Problem Relation Age of Onset   Ulcers Mother    Social History   Socioeconomic History   Marital status: Married    Spouse name: Not on file   Number of children: Not on file   Years of education: Not on file   Highest education level: Not on file  Occupational History   Not on file  Tobacco Use   Smoking status: Former    Packs/day: 0.25    Years: 0.50    Additional pack years: 0.00    Total pack years: 0.13    Types: Cigarettes   Smokeless tobacco: Never  Vaping Use   Vaping Use: Never used  Substance and Sexual Activity   Alcohol use: Not Currently   Drug use: Not on file   Sexual activity: Yes  Other Topics Concern   Not on file  Social History Narrative   Not on file   Social Determinants of Health   Financial Resource Strain: Low Risk  (04/22/2022)   Overall Financial Resource Strain (CARDIA)    Difficulty of Paying Living Expenses: Not hard at all  Food Insecurity: No Food Insecurity (04/22/2022)   Hunger Vital Sign  Worried About Programme researcher, broadcasting/film/video in the Last Year: Never true    Ran Out of Food in the Last Year: Never true  Transportation Needs: No Transportation Needs (04/22/2022)   PRAPARE - Administrator, Civil Service (Medical): No    Lack of Transportation (Non-Medical): No  Physical Activity: Inactive (04/22/2022)   Exercise Vital Sign    Days of Exercise per Week: 0 days    Minutes of Exercise per Session: 0 min  Stress: No Stress Concern Present (04/22/2022)   [employer] of Occupational Health - Occupational Stress Questionnaire    Feeling of Stress : Not at all  Social Connections: Not on file    Tobacco Counseling Counseling given: Not Answered   Clinical  Intake:  Pre-visit preparation completed: Yes  Pain : No/denies pain     Nutritional Status: BMI > 30  Obese Nutritional Risks: None Diabetes: No  How often do you need to have someone help you when you read instructions, pamphlets, or other written materials from your doctor or pharmacy?: 5 - Always  Diabetic? no  Interpreter Needed?: Yes Interpreter Agency: Landscape architect Interpreter Name: Zoila Shutter ID: [id number]  Information entered by :: NAllen LPN   Activities of Daily Living    04/22/2022    3:25 PM  In your present state of health, do you have any difficulty performing the following activities:  Hearing? 0  Vision? 0  Difficulty concentrating or making decisions? 0  Walking or climbing stairs? 1  Dressing or bathing? 0  Doing errands, shopping? 1  Preparing Food and eating ? N  Using the Toilet? N  In the past six months, have you accidently leaked urine? N  Do you have problems with loss of bowel control? N  Managing your Medications? N  Managing your Finances? N  Housekeeping or managing your Housekeeping? N    Patient Care Team: Storm Frisk, MD as PCP - General (Pulmonary Disease) Fuller Plan, MD as Consulting Physician (Rheumatology)  Indicate any recent Medical Services you may have received from other than [facility] providers in the past year (date may be approximate).     Assessment:   This is a routine wellness examination for [facility].  Hearing/Vision screen Vision Screening - Comments:: No regular eye exams  Dietary issues and exercise activities discussed: Current Exercise Habits: The patient does not participate in regular exercise at present   Goals Addressed             This Visit's Progress    Patient Stated       04/22/2022, no goals       Depression Screen    04/22/2022    3:25 PM 02/18/2022    4:38 PM 12/24/2021   10:04 PM 09/22/2021   10:55 AM 07/20/2021    3:56 PM 03/10/2021    3:57 PM 10/14/2020     3:25 PM  PHQ 2/9 Scores  PHQ - 2 Score 0 0 0 0 0 0 3  PHQ- 9 Score  1 0    6    Fall Risk    04/22/2022    3:24 PM 03/30/2022    3:49 PM 02/18/2022    4:38 PM 12/24/2021   10:04 PM 09/22/2021   10:54 AM  Fall Risk   Falls in the past year? 0 0 0 0 0  Number falls in past yr: 0 0 0  0  Injury with Fall? 0 0 0 0 0  Risk for  fall due to : Medication side effect No Fall Risks No Fall Risks No Fall Risks No Fall Risks  Follow up Falls prevention discussed;Education provided;Falls evaluation completed   Falls evaluation completed     FALL RISK PREVENTION PERTAINING TO THE HOME:  Any stairs in or around the home? No  If so, are there any without handrails?  N/a Home free of loose throw rugs in walkways, pet beds, electrical cords, etc? Yes  Adequate lighting in your home to reduce risk of falls? Yes   ASSISTIVE DEVICES UTILIZED TO PREVENT FALLS:  Life alert? No  Use of a cane, walker or w/c? Yes  Grab bars in the bathroom? No  Shower chair or bench in shower? No  Elevated toilet seat or a handicapped toilet? No   TIMED UP AND GO:  Was the test performed? No .       Cognitive Function:  6 CIT not administered due to language barrier. Patient kept repeating self during conversation via the interpreter.        Immunizations Immunization History  Administered Date(s) Administered   Fluad Quad(high Dose 65+) 09/22/2021   Influenza,inj,Quad PF,6+ Mos 12/10/2019, 09/22/2020   PFIZER Comirnaty(Gray Top)Covid-19 Tri-Sucrose Vaccine 01/15/2020   PFIZER(Purple Top)SARS-COV-2 Vaccination 03/06/2019, 04/04/2019   PNEUMOCOCCAL CONJUGATE-20 11/18/2020   Pneumococcal Conjugate-13 12/10/2019   Tdap 12/10/2019    TDAP status: Up to date  Flu Vaccine status: Up to date  Pneumococcal vaccine status: Up to date  Covid-19 vaccine status: Completed vaccines  Qualifies for Shingles Vaccine?  N/a   Zostavax completed No   Shingrix Completed?: No.    Education has been provided  regarding the importance of this vaccine. Patient has been advised to call insurance company to determine out of pocket expense if they have not yet received this vaccine. Advised may also receive vaccine at local pharmacy or Health Dept. Verbalized acceptance and understanding.  Screening Tests Health Maintenance  Topic Date Due   Medicare Annual Wellness (AWV)  Never done   COVID-19 Vaccine (4 - 2023-24 season) 09/04/2021   INFLUENZA VACCINE  08/05/2022   DTaP/Tdap/Td (2 - Td or Tdap) 12/09/2029   Pneumonia Vaccine 95+ Years old  Completed   HPV VACCINES  Aged Out   Zoster Vaccines- Shingrix  Discontinued    Health Maintenance  Health Maintenance Due  Topic Date Due   Medicare Annual Wellness (AWV)  Never done   COVID-19 Vaccine (4 - 2023-24 season) 09/04/2021    Colorectal cancer screening: No longer required.   Lung Cancer Screening: (Low Dose CT Chest recommended if Age 74-80 years, 30 pack-year currently smoking OR have quit w/in 15years.) does not qualify.   Lung Cancer Screening Referral: no  Additional Screening:  Hepatitis C Screening: does not qualify;   Vision Screening: Recommended annual ophthalmology exams for early detection of glaucoma and other disorders of the eye. Is the patient up to date with their annual eye exam?  No  Who is the provider or what is the name of the office in which the patient attends annual eye exams? none If pt is not established with a provider, would they like to be referred to a provider to establish care? No .   Dental Screening: Recommended annual dental exams for proper oral hygiene  Community Resource Referral / Chronic Care Management: CRR required this visit?  No   CCM required this visit?  No      Plan:     I have personally reviewed and noted  the following in the patient's chart:   Medical and social history Use of alcohol, tobacco or illicit drugs  Current medications and supplements including opioid  prescriptions. Patient is not currently taking opioid prescriptions. Functional ability and status Nutritional status Physical activity Advanced directives List of other physicians Hospitalizations, surgeries, and ER visits in previous 12 months Vitals Screenings to include cognitive, depression, and falls Referrals and appointments  In addition, I have reviewed and discussed with patient certain preventive protocols, quality metrics, and best practice recommendations. A written personalized care plan for preventive services as well as general preventive health recommendations were provided to patient.     Barb Merino, LPN   1/61/0960   Nurse Notes: Patient wants to see a bone specialist. He will discuss with provider at next appointment. It's unknown if he is taking medication. He just was talking about wanting to see a bone specialist.  Due to this being a virtual visit, the after visit summary with patients personalized plan was offered to patient via mail or my-chart.  to pick up at office at next visit

## 2022-04-29 ENCOUNTER — Ambulatory Visit: Payer: Medicare Other | Admitting: Physician Assistant

## 2022-05-12 ENCOUNTER — Other Ambulatory Visit: Payer: Self-pay

## 2022-05-17 NOTE — Progress Notes (Unsigned)
Established Patient Office Visit  Subjective:  Patient ID: Patrick Ortega, male    DOB: 1940-02-04  Age: 82 y.o. MRN: [id number]  CC: Neck and shoulder pain   HPI 03/10/21 Patrick Ortega presents for primary care follow-up.  This patient was seen with [name] interpreter audio Spanish andy [phone] Patient continues to have neck and shoulder pain.  He is being evaluated extensively by orthopedics and has follow-up appointments over the next 2 weeks for nerve conduction study and shoulder evaluations.  He continues to complain of the pain today and I reminded him and his son who is with him today who his name is Patrick Ortega these appointments are available and he should follow-up on them.  Another issue is he complains of irritation at the end of his penis burning on urination and difficulty urinating.  He been seen by urology previously was given high-dose Flomax and was told to follow-up expectantly on his PSA that was elevated at 7.0 he does not have follow-up appointments with urology currently.  He was last seen by urology December 2021.  05/24/21  Patient is seen today as a work in visit and the visit was assisted by [place] video interpreter [phone] Since the last visit patient has been seen by urology they performed a part prostate biopsy which was negative for malignancy he has been maintained on tamsulosin  Today though the patient comes in with burning sensation at the tip of his penis and on and around the uncircumcised area of the penis.  He has some blood intermittently from the head of the penis and sometimes in the urine.  He has some burning on urination. The patient also complains of neck discomfort but he underwent EMG nerve conduction study with orthopedics which did not show evidence of nerve root compression on the cervical spine.  He does not need injections in the neck area.  He did have shoulder work performed.  7/17 Patient seen in return visit visit was assisted by  Spanish video interpreter Patrick Ortega [phone] Patient planes of neck pain and throat pain over the last week.  He states the lesion on his penis is improved but would like a refill on the Zovirax.  Note on arrival blood pressure is elevated 147/72. The patient has been up to now on the amlodipine 10 mg daily chlorthalidone 25 mg daily.  He states he has been compliant with this medication.   9/19 Patient seen in return follow-up Spanish interpreter Patrick Ortega [phone] assisted.  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  He has no other real complaints at this visit.  Unfortunately blood pressure on arrival elevated 146/69 and it turns out he never filled his amlodipine or his chlorthalidone we wrote for the last visit in July.  He has been taking the low-dose valsartan 80 mg daily.  12/21 Patient is seen in follow-up visit assisted by Spanish interpreter Patrick Ortega [phone].  Patient states since being on gabapentin his hand numbness has not changed and he is having more balance and visual changes for 1 month.  He did also have to stop his blood pressure medicines when became quite dizzy.  Note off blood pressure medications he is 114/73 on arrival.  Patient is taking tamsulosin 0.8 mg daily and states this does help his prostate flow however he still not emptying his bladder fully and still has dysuria.  The acyclovir we applied to the end of his  penis has not been of any benefit to him.  Patient has no other complaints.  03/30/22 This patient was seen previously and February by [facility] after I saw the patient in December.  Today's visit assisted by Spanish video interpreter Patrick Ortega [phone]  Patient continues to planing of penis pain did see urology the cream that was prescribed caused pain and he quit taking it he would like to have the acyclovir or back that we gave him previously.  On arrival blood pressure elevated 190/92  recheck remains elevated.  Patient does have cervical disc disease but his neck pain is better.  He states Neurontin did not help him he is not taking this.  Patient has been on acyclovir in the past with some improvement.  He is not taking the tamsulosin needs to be refilled.  05/18/22 Patient returns today still complaining of neck and shoulder pain.  Visit was assisted by Spanish interpreter Patrick Ortega [phone].  Blood pressure initially elevated but on recheck 125/67. Patient is recently saw urology and he is stable with his PSA and he is on the tamsulosin doing well with this has no urinary complaints today.  The largest issue is that he has significant cervical disc disease and is yet to be able to see neurosurgery.  Upon investigation they have been trying to call him all through February and March and left voicemails with no answer.  Upon further investigation it appears family members must take him to these appointments and they have not been notified as well.  See assessment and plan   Past Medical History:  Diagnosis Date   GERD (gastroesophageal reflux disease)    Hypertension     Past Surgical History:  Procedure Laterality Date   NO PAST SURGERIES      Family History  Problem Relation Age of Onset   Ulcers Mother     Social History   Socioeconomic History   Marital status: Married    Spouse name: Not on file   Number of children: Not on file   Years of education: Not on file   Highest education level: Not on file  Occupational History   Not on file  Tobacco Use   Smoking status: Former    Packs/day: 0.25    Years: 0.50    Additional pack years: 0.00    Total pack years: 0.13    Types: Cigarettes   Smokeless tobacco: Never  Vaping Use   Vaping Use: Never used  Substance and Sexual Activity   Alcohol use: Not Currently   Drug use: Not on file   Sexual activity: Yes  Other Topics Concern   Not on file  Social History Narrative   Not on file   Social  Determinants of Health   Financial Resource Strain: Low Risk  (04/22/2022)   Overall Financial Resource Strain (CARDIA)    Difficulty of Paying Living Expenses: Not hard at all  Food Insecurity: No Food Insecurity (04/22/2022)   Hunger Vital Sign    Worried About Running Out of Food in the Last Year: Never true    Ran Out of Food in the Last Year: Never true  Transportation Needs: No Transportation Needs (04/22/2022)   PRAPARE - Administrator, Civil Service (Medical): No    Lack of Transportation (Non-Medical): No  Physical Activity: Inactive (04/22/2022)   Exercise Vital Sign    Days of Exercise per Week: 0 days    Minutes of Exercise per Session: 0 min  Stress: No  Stress Concern Present (04/22/2022)   [employer] of Occupational Health - Occupational Stress Questionnaire    Feeling of Stress : Not at all  Social Connections: Not on file  Intimate Partner Violence: Not on file    Outpatient Medications Prior to Visit  Medication Sig Dispense Refill   acyclovir ointment (ZOVIRAX) 5 % Apply 1 Application topically in the morning and at bedtime. 30 g 2   amLODipine (NORVASC) 10 MG tablet Take 1 tablet (10 mg total) by mouth daily. 90 tablet 2   clobetasol cream (TEMOVATE) 0.05 % Apply to affected area 2 or 3 times daily. (Patient not taking: Reported on 03/30/2022) 45 g 1   gabapentin (NEURONTIN) 100 MG capsule Take 1 capsule (100 mg total) by mouth 3 (three) times daily. (Patient not taking: Reported on 03/30/2022) 90 capsule 3   tamsulosin (FLOMAX) 0.4 MG CAPS capsule Take 2 capsules (0.8 mg total) by mouth daily. 60 capsule 3   valsartan (DIOVAN) 160 MG tablet Take 1 tablet (160 mg total) by mouth daily. 90 tablet 3   No facility-administered medications prior to visit.    No Known Allergies  ROS Review of Systems  Constitutional: Negative.   HENT:  Negative for ear pain, postnasal drip, rhinorrhea, sinus pressure, sore throat, trouble swallowing and voice change.    Eyes: Negative.   Respiratory: Negative.  Negative for apnea, cough, choking, chest tightness, shortness of breath, wheezing and stridor.   Cardiovascular: Negative.  Negative for chest pain, palpitations and leg swelling.  Gastrointestinal: Negative.  Negative for abdominal distention, abdominal pain, nausea and vomiting.  Genitourinary:  Negative for difficulty urinating, dysuria, frequency, hematuria, penile discharge, penile swelling, scrotal swelling, testicular pain and urgency.  Musculoskeletal:  Positive for neck pain. Negative for arthralgias and myalgias.  Skin: Negative.  Negative for rash.  Allergic/Immunologic: Negative.  Negative for environmental allergies and food allergies.  Neurological: Negative.  Negative for dizziness, syncope, weakness and headaches.  Hematological: Negative.  Negative for adenopathy. Does not bruise/bleed easily.  Psychiatric/Behavioral: Negative.  Negative for agitation and sleep disturbance. The patient is not nervous/anxious.       Objective:    Physical Exam Vitals reviewed.  Constitutional:      Appearance: Normal appearance. He is well-developed. He is not diaphoretic.  HENT:     Head: Normocephalic and atraumatic.     Nose: No nasal deformity, septal deviation, mucosal edema or rhinorrhea.     Right Sinus: No maxillary sinus tenderness or frontal sinus tenderness.     Left Sinus: No maxillary sinus tenderness or frontal sinus tenderness.     Mouth/Throat:     Mouth: Mucous membranes are moist.     Pharynx: No oropharyngeal exudate or posterior oropharyngeal erythema.  Eyes:     General: No scleral icterus.    Conjunctiva/sclera: Conjunctivae normal.     Pupils: Pupils are equal, round, and reactive to light.  Neck:     Thyroid: No thyromegaly.     Vascular: No carotid bruit or JVD.     Trachea: Trachea normal. No tracheal tenderness or tracheal deviation.  Cardiovascular:     Rate and Rhythm: Normal rate and regular rhythm.      Chest Wall: PMI is not displaced.     Pulses: Normal pulses. No decreased pulses.     Heart sounds: Normal heart sounds, S1 normal and S2 normal. Heart sounds not distant. No murmur heard.    No systolic murmur is present.     No diastolic  murmur is present.     No friction rub. No gallop. No S3 or S4 sounds.  Pulmonary:     Effort: No tachypnea, accessory muscle usage or respiratory distress.     Breath sounds: No stridor. No decreased breath sounds, wheezing, rhonchi or rales.  Chest:     Chest wall: No tenderness.  Abdominal:     General: Bowel sounds are normal. There is no distension.     Palpations: Abdomen is soft. Abdomen is not rigid.     Tenderness: There is no abdominal tenderness. There is no guarding or rebound.  Musculoskeletal:        General: Tenderness present.     Cervical back: Normal range of motion and neck supple. No edema, erythema or rigidity. No muscular tenderness. Normal range of motion.     Comments: Decreased rom of both shoulders.    Lymphadenopathy:     Head:     Right side of head: No submental or submandibular adenopathy.     Left side of head: No submental or submandibular adenopathy.     Cervical: No cervical adenopathy.  Skin:    General: Skin is warm and dry.     Coloration: Skin is not pale.     Findings: No rash.     Nails: There is no clubbing.  Neurological:     General: No focal deficit present.     Mental Status: He is alert and oriented to person, place, and time.     Sensory: No sensory deficit.  Psychiatric:        Mood and Affect: Mood normal.        Speech: Speech normal.        Behavior: Behavior normal.        Thought Content: Thought content normal.        Judgment: Judgment normal.     BP 125/67   Pulse 79   Wt 169 lb 9.6 oz (76.9 kg)   SpO2 93%   BMI 32.05 kg/m  Wt Readings from Last 3 Encounters:  05/18/22 169 lb 9.6 oz (76.9 kg)  04/22/22 160 lb (72.6 kg)  03/30/22 168 lb (76.2 kg)     Health Maintenance Due   Topic Date Due   COVID-19 Vaccine (4 - 2023-24 season) 09/04/2021    There are no preventive care reminders to display for this patient.  Lab Results  Component Value Date   TSH 2.080 04/25/2019   Lab Results  Component Value Date   WBC 10.4 05/25/2021   HGB 15.1 05/25/2021   HCT 44.3 05/25/2021   MCV 86 05/25/2021   PLT 463 (H) 05/25/2021   Lab Results  Component Value Date   NA 140 03/30/2022   K 4.2 03/30/2022   CO2 19 (L) 03/30/2022   GLUCOSE 120 (H) 03/30/2022   BUN 18 03/30/2022   CREATININE 0.85 03/30/2022   BILITOT 0.6 03/30/2022   ALKPHOS 117 03/30/2022   AST 21 03/30/2022   ALT 16 03/30/2022   PROT 7.9 03/30/2022   ALBUMIN 4.6 03/30/2022   CALCIUM 10.1 03/30/2022   EGFR 87 03/30/2022   Lab Results  Component Value Date   CHOL 195 05/09/2019   Lab Results  Component Value Date   HDL 64 05/09/2019   Lab Results  Component Value Date   LDLCALC 112 (H) 05/09/2019   Lab Results  Component Value Date   TRIG 110 05/09/2019   Lab Results  Component Value Date   CHOLHDL 3.0 05/09/2019  No results found for: "HGBA1C"    Assessment & Plan:   Problem List Items Addressed This Visit       Cardiovascular and Mediastinum   Essential hypertension - Primary    Blood pressure currently well-controlled no change in medication      Relevant Medications   amLODipine (NORVASC) 10 MG tablet   valsartan (DIOVAN) 160 MG tablet     Musculoskeletal and Integument   Cervical disc disease    We have been unable to get this patient in with neurosurgery due to the patient not answering the phone plan will be for this patient to be rereferred to [facility]  Case management called to get the patient an appointment we will try to match this with patient family availability to take him to the appointment      Meds ordered this encounter  Medications   amLODipine (NORVASC) 10 MG tablet    Sig: Take 1 tablet (10 mg total) by mouth daily.     Dispense:  90 tablet    Refill:  2   tamsulosin (FLOMAX) 0.4 MG CAPS capsule    Sig: Take 2 capsules (0.8 mg total) by mouth daily.    Dispense:  60 capsule    Refill:  3    Future refill   valsartan (DIOVAN) 160 MG tablet    Sig: Take 1 tablet (160 mg total) by mouth daily.    Dispense:  90 tablet    Refill:  3  Follow-up: Return in about 4 months (around 09/18/2022) for htn.   30 minutes needed extra time needed because of language barrier Shan Levans, MD

## 2022-05-18 ENCOUNTER — Encounter: Payer: Self-pay | Admitting: Critical Care Medicine

## 2022-05-18 ENCOUNTER — Other Ambulatory Visit: Payer: Self-pay

## 2022-05-18 ENCOUNTER — Ambulatory Visit: Payer: Medicare Other | Attending: Critical Care Medicine | Admitting: Critical Care Medicine

## 2022-05-18 ENCOUNTER — Telehealth: Payer: Self-pay

## 2022-05-18 VITALS — BP 125/67 | HR 79 | Wt 169.6 lb

## 2022-05-18 DIAGNOSIS — M5090 Cervical disc disorder, unspecified, unspecified cervical region: Secondary | ICD-10-CM | POA: Diagnosis not present

## 2022-05-18 DIAGNOSIS — Z79899 Other long term (current) drug therapy: Secondary | ICD-10-CM | POA: Diagnosis not present

## 2022-05-18 DIAGNOSIS — I10 Essential (primary) hypertension: Secondary | ICD-10-CM | POA: Diagnosis not present

## 2022-05-18 DIAGNOSIS — K219 Gastro-esophageal reflux disease without esophagitis: Secondary | ICD-10-CM | POA: Insufficient documentation

## 2022-05-18 DIAGNOSIS — Z87891 Personal history of nicotine dependence: Secondary | ICD-10-CM | POA: Diagnosis not present

## 2022-05-18 MED ORDER — AMLODIPINE BESYLATE 10 MG PO TABS
10.0000 mg | ORAL_TABLET | Freq: Every day | ORAL | 2 refills | Status: DC
  Filled 2022-05-18 – 2022-06-28 (×2): qty 90, 90d supply, fill #0

## 2022-05-18 MED ORDER — VALSARTAN 160 MG PO TABS
160.0000 mg | ORAL_TABLET | Freq: Every day | ORAL | 3 refills | Status: DC
  Filled 2022-05-18 – 2022-06-28 (×2): qty 90, 90d supply, fill #0

## 2022-05-18 MED ORDER — TAMSULOSIN HCL 0.4 MG PO CAPS
0.8000 mg | ORAL_CAPSULE | Freq: Every day | ORAL | 3 refills | Status: DC
  Filled 2022-05-18 – 2022-06-11 (×2): qty 60, 30d supply, fill #0
  Filled 2022-08-11: qty 60, 30d supply, fill #1
  Filled ????-??-??: fill #0

## 2022-05-18 NOTE — Telephone Encounter (Signed)
At the request of Dr Delford Field, I spoke to Paula/ [facility] : [phone] and scheduled patient with Dr Maurice Small for 05/26/2202 @ 11:30 and explained that the patient will need a Spanish interpreter and he is hard of hearing and may not answer a phone   I then met with the patient with [employer] Spanish interpreter [phone] and explained about the appointment.  He said he can't go because he wants one of his children to take him and they work and he can only go to an appointment on a Friday at 1600. I asked him if there are any options for appointment times as the clinic may not be open at 1600 on Friday.  He then said he could go on Wed or Thurs at 1530 or 1600 and would like the appointment to be in June.  I told him that I would need to check and let him know.   I called Paula/ [facility] again and had to leave a message about changing the appointment and requested a call back.  I then spoke to the patient with assistance of [facility] Spanish interpreter [phone] and explained that I had to leave a message for Gunnar Fusi and will need to let him know when the appointment will be and I may not know that information until after he leaves the clinic.  I tried to confirm who would be best to call with this information and he said he doesn't use a phone.  He tired to find phone numbers in his wallet but was having difficulty finding the number of a family member.  Esaw Dace, [facility] registrar, who is Spanish speaking, was able to work with the patient and update his contact information.  She was also able to speak to his son, Shari Prows, and they determined that Shari Prows would be the primary contact.  I then received a call back from [facility].  She was able to schedule the patient with Hoyt Koch, MD on 6/7//2024 @ 1600. He needs to arrive at 1530 and they will have an interpreter to assist with the appointment.  Their clinic address: [address], [place].   Daniella said she  would inform patient's son, Shari Prows , of the appointment details.

## 2022-05-18 NOTE — Assessment & Plan Note (Signed)
Blood pressure currently well-controlled no change in medication

## 2022-05-18 NOTE — Patient Instructions (Addendum)
No change in medications refill sent to our pharmacy  An appointment with the neck doctor will be made we will give you the date and time today  Return to see Dr. Delford Field 4 months  An appointment with neurosurgery made May 22 at 1130AM  No hay cambios en el reabastecimiento de medicamentos enviados a nuestra farmacia.  Se concertar una cita con el medico del cuello hoy le daremos la fecha y hora  Volver a ver al Dr. Delford Field 4 meses

## 2022-05-18 NOTE — Assessment & Plan Note (Signed)
We have been unable to get this patient in with neurosurgery due to the patient not answering the phone plan will be for this patient to be rereferred to [facility]  Case management called to get the patient an appointment we will try to match this with patient family availability to take him to the appointment

## 2022-06-11 ENCOUNTER — Other Ambulatory Visit: Payer: Self-pay

## 2022-06-11 DIAGNOSIS — Z6832 Body mass index (BMI) 32.0-32.9, adult: Secondary | ICD-10-CM | POA: Diagnosis not present

## 2022-06-11 DIAGNOSIS — M4802 Spinal stenosis, cervical region: Secondary | ICD-10-CM | POA: Diagnosis not present

## 2022-06-11 DIAGNOSIS — M5412 Radiculopathy, cervical region: Secondary | ICD-10-CM | POA: Diagnosis not present

## 2022-06-28 ENCOUNTER — Other Ambulatory Visit: Payer: Self-pay

## 2022-07-01 ENCOUNTER — Other Ambulatory Visit: Payer: Self-pay

## 2022-07-02 ENCOUNTER — Other Ambulatory Visit: Payer: Self-pay

## 2022-08-02 DIAGNOSIS — M4802 Spinal stenosis, cervical region: Secondary | ICD-10-CM | POA: Diagnosis not present

## 2022-08-11 ENCOUNTER — Other Ambulatory Visit: Payer: Self-pay

## 2022-09-02 ENCOUNTER — Ambulatory Visit: Payer: Medicare Other | Attending: Critical Care Medicine | Admitting: Critical Care Medicine

## 2022-09-02 ENCOUNTER — Encounter: Payer: Self-pay | Admitting: Critical Care Medicine

## 2022-09-02 ENCOUNTER — Other Ambulatory Visit: Payer: Self-pay

## 2022-09-02 ENCOUNTER — Telehealth: Payer: Self-pay | Admitting: Critical Care Medicine

## 2022-09-02 VITALS — BP 125/63 | HR 62 | Ht 61.0 in | Wt 167.6 lb

## 2022-09-02 DIAGNOSIS — I10 Essential (primary) hypertension: Secondary | ICD-10-CM

## 2022-09-02 DIAGNOSIS — M5030 Other cervical disc degeneration, unspecified cervical region: Secondary | ICD-10-CM | POA: Diagnosis not present

## 2022-09-02 DIAGNOSIS — R300 Dysuria: Secondary | ICD-10-CM | POA: Diagnosis not present

## 2022-09-02 DIAGNOSIS — M5090 Cervical disc disorder, unspecified, unspecified cervical region: Secondary | ICD-10-CM

## 2022-09-02 DIAGNOSIS — Z603 Acculturation difficulty: Secondary | ICD-10-CM

## 2022-09-02 DIAGNOSIS — Z556 Problems related to health literacy: Secondary | ICD-10-CM | POA: Diagnosis not present

## 2022-09-02 DIAGNOSIS — M546 Pain in thoracic spine: Secondary | ICD-10-CM | POA: Diagnosis present

## 2022-09-02 DIAGNOSIS — Z87891 Personal history of nicotine dependence: Secondary | ICD-10-CM | POA: Diagnosis not present

## 2022-09-02 DIAGNOSIS — R350 Frequency of micturition: Secondary | ICD-10-CM | POA: Diagnosis not present

## 2022-09-02 DIAGNOSIS — M542 Cervicalgia: Secondary | ICD-10-CM | POA: Diagnosis present

## 2022-09-02 DIAGNOSIS — Z23 Encounter for immunization: Secondary | ICD-10-CM | POA: Diagnosis not present

## 2022-09-02 DIAGNOSIS — R319 Hematuria, unspecified: Secondary | ICD-10-CM

## 2022-09-02 DIAGNOSIS — H9193 Unspecified hearing loss, bilateral: Secondary | ICD-10-CM

## 2022-09-02 DIAGNOSIS — N419 Inflammatory disease of prostate, unspecified: Secondary | ICD-10-CM | POA: Diagnosis present

## 2022-09-02 DIAGNOSIS — Z758 Other problems related to medical facilities and other health care: Secondary | ICD-10-CM

## 2022-09-02 MED ORDER — VALSARTAN 160 MG PO TABS
160.0000 mg | ORAL_TABLET | Freq: Every day | ORAL | 3 refills | Status: DC
  Filled 2022-09-02 – 2022-09-21 (×2): qty 90, 90d supply, fill #0

## 2022-09-02 MED ORDER — TAMSULOSIN HCL 0.4 MG PO CAPS
0.8000 mg | ORAL_CAPSULE | Freq: Every day | ORAL | 3 refills | Status: DC
  Filled 2022-09-02 – 2022-09-10 (×2): qty 60, 30d supply, fill #0
  Filled 2022-10-18: qty 60, 30d supply, fill #1
  Filled 2022-12-06: qty 60, 30d supply, fill #2
  Filled 2023-01-13: qty 60, 30d supply, fill #3

## 2022-09-02 MED ORDER — AMLODIPINE BESYLATE 10 MG PO TABS
10.0000 mg | ORAL_TABLET | Freq: Every day | ORAL | 2 refills | Status: DC
  Filled 2022-09-02 – 2022-09-21 (×2): qty 90, 90d supply, fill #0
  Filled 2022-12-23: qty 90, 90d supply, fill #1
  Filled 2023-03-18: qty 90, 90d supply, fill #2

## 2022-09-02 NOTE — Assessment & Plan Note (Signed)
Refer back to neurosurgery to see if we can get an in person interpreter get injections done on neck

## 2022-09-02 NOTE — Patient Instructions (Signed)
Labs today Meds refilled  Referrals made

## 2022-09-02 NOTE — Progress Notes (Signed)
Established Patient Office Visit  Subjective:  Patient ID: Patrick Ortega, male    DOB: 04-14-1940  Age: 82 y.o. MRN: [id number]  Chief Complaint  Patient presents with   Neck Pain    Neck pain travels down to back and across shoulders.    Prostatitis      HPI 03/10/21 Patrick Ortega presents for primary care follow-up.  This patient was seen with Patrick Ortega interpreter audio Spanish Patrick Ortega [phone] Patient continues to have neck and shoulder pain.  He is being evaluated extensively by orthopedics and has follow-up appointments over the next 2 weeks for nerve conduction study and shoulder evaluations.  He continues to complain of the pain today and I reminded him and his son who is with him today who his name is Patrick Ortega these appointments are available and he should follow-up on them.  Another issue is he complains of irritation at the end of his penis burning on urination and difficulty urinating.  He been seen by urology previously was given high-dose Flomax and was told to follow-up expectantly on his PSA that was elevated at 7.0 he does not have follow-up appointments with urology currently.  He was last seen by urology December 2021.  05/24/21  Patient is seen today as a work in visit and the visit was assisted by [place] video interpreter [phone] Since the last visit patient has been seen by urology they performed a part prostate biopsy which was negative for malignancy he has been maintained on tamsulosin  Today though the patient comes in with burning sensation at the tip of his penis and on and around the uncircumcised area of the penis.  He has some blood intermittently from the head of the penis and sometimes in the urine.  He has some burning on urination. The patient also complains of neck discomfort but he underwent EMG nerve conduction study with orthopedics which did not show evidence of nerve root compression on the cervical spine.  He does not need injections in the neck area.   He did have shoulder work performed.  7/17 Patient seen in return visit visit was assisted by Spanish video interpreter Patrick Ortega [phone] Patient planes of neck pain and throat pain over the last week.  He states the lesion on his penis is improved but would like a refill on the Zovirax.  Note on arrival blood pressure is elevated 147/72. The patient has been up to now on the amlodipine 10 mg daily chlorthalidone 25 mg daily.  He states he has been compliant with this medication.   9/19 Patient seen in return follow-up Spanish interpreter Patrick Ortega #[phone] assisted.  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  He has no other real complaints at this visit.  Unfortunately blood pressure on arrival elevated 146/69 and it turns out he never filled his amlodipine or his chlorthalidone we wrote for the last visit in July.  He has been taking the low-dose valsartan 80 mg daily.  12/21 Patient is seen in follow-up visit assisted by Spanish interpreter Patrick Ortega [phone].  Patient states since being on gabapentin his hand numbness has not changed and he is having more balance and visual changes for 1 month.  He did also have to stop his blood pressure medicines when became quite dizzy.  Note off blood pressure medications he is 114/73 on arrival.  Patient is taking tamsulosin 0.8 mg daily and states this does help his  prostate flow however he still not emptying his bladder fully and still has dysuria.  The acyclovir we applied to the end of his penis has not been of any benefit to him.  Patient has no other complaints.  03/30/22 This patient was seen previously and February by Patrick Ortega after I saw the patient in December.  Today's visit assisted by Spanish video interpreter Patrick Ortega [phone]  Patient continues to planing of penis pain did see urology the cream that was prescribed caused pain and he quit taking it he would like to have  the acyclovir or back that we gave him previously.  On arrival blood pressure elevated 190/92 recheck remains elevated.  Patient does have cervical disc disease but his neck pain is better.  He states Neurontin did not help him he is not taking this.  Patient has been on acyclovir in the past with some improvement.  He is not taking the tamsulosin needs to be refilled.  05/18/22 Patient returns today still complaining of neck and shoulder pain.  Visit was assisted by Spanish interpreter Patrick Ortega [phone].  Blood pressure initially elevated but on recheck 125/67. Patient is recently saw urology and he is stable with his PSA and he is on the tamsulosin doing well with this has no urinary complaints today.  The largest issue is that he has significant cervical disc disease and is yet to be able to see neurosurgery.  Upon investigation they have been trying to call him all through February and March and left voicemails with no answer.  Upon further investigation it appears family members must take him to these appointments and they have not been notified as well.  09/02/2022 Patient is seen in return follow-up Spanish interpreter Patrick Ortega [phone] assisted in visit.  Patient still having neck pain is yet to get injections with neurosurgery as prescribed.  There appears to be a language barrier getting him into the clinic.  When he calls nobody answers or they cannot run understand him.  He also has hearing deficit.  Has dysuria and hematuria was seen previously by urology needs follow-up visit.  The acyclovir we have been giving on the tip of his penis is of no use and does not help him.  There are no other complaints. Past Medical History:  Diagnosis Date   GERD (gastroesophageal reflux disease)    Hypertension     Past Surgical History:  Procedure Laterality Date   NO PAST SURGERIES      Family History  Problem Relation Age of Onset   Ulcers Mother     Social History   Socioeconomic History    Marital status: Married    Spouse name: Not on file   Number of children: Not on file   Years of education: Not on file   Highest education level: Not on file  Occupational History   Not on file  Tobacco Use   Smoking status: Former    Current packs/day: 0.25    Average packs/day: 0.3 packs/day for 0.5 years (0.1 ttl pk-yrs)    Types: Cigarettes   Smokeless tobacco: Never  Vaping Use   Vaping status: Never Used  Substance and Sexual Activity   Alcohol use: Not Currently   Drug use: Not on file   Sexual activity: Yes  Other Topics Concern   Not on file  Social History Narrative   Not on file   Social Determinants of Health   Financial Resource Strain: Low Risk  (04/22/2022)   Overall Financial Resource Strain (  CARDIA)    Difficulty of Paying Living Expenses: Not hard at all  Food Insecurity: No Food Insecurity (04/22/2022)   Hunger Vital Sign    Worried About Running Out of Food in the Last Year: Never true    Ran Out of Food in the Last Year: Never true  Transportation Needs: No Transportation Needs (04/22/2022)   PRAPARE - Administrator, Civil Service (Medical): No    Lack of Transportation (Non-Medical): No  Physical Activity: Inactive (04/22/2022)   Exercise Vital Sign    Days of Exercise per Week: 0 days    Minutes of Exercise per Session: 0 min  Stress: No Stress Concern Present (04/22/2022)   [employer] of Occupational Health - Occupational Stress Questionnaire    Feeling of Stress : Not at all  Social Connections: Not on file  Intimate Partner Violence: Not on file    Outpatient Medications Prior to Visit  Medication Sig Dispense Refill   acyclovir ointment (ZOVIRAX) 5 % Apply 1 Application topically in the morning and at bedtime. 30 g 2   amLODipine (NORVASC) 10 MG tablet Take 1 tablet (10 mg total) by mouth daily. 90 tablet 2   tamsulosin (FLOMAX) 0.4 MG CAPS capsule Take 2 capsules (0.8 mg total) by mouth daily. 60 capsule 3   valsartan  (DIOVAN) 160 MG tablet Take 1 tablet (160 mg total) by mouth daily. 90 tablet 3   No facility-administered medications prior to visit.    No Known Allergies  ROS Review of Systems  Constitutional: Negative.   HENT:  Negative for ear pain, postnasal drip, rhinorrhea, sinus pressure, sore throat, trouble swallowing and voice change.   Eyes: Negative.   Respiratory: Negative.  Negative for apnea, cough, choking, chest tightness, shortness of breath, wheezing and stridor.   Cardiovascular: Negative.  Negative for chest pain, palpitations and leg swelling.  Gastrointestinal: Negative.  Negative for abdominal distention, abdominal pain, nausea and vomiting.  Genitourinary:  Positive for dysuria, hematuria and penile discharge. Negative for difficulty urinating, frequency, penile swelling, scrotal swelling, testicular pain and urgency.  Musculoskeletal:  Positive for neck pain. Negative for arthralgias and myalgias.  Skin: Negative.  Negative for rash.  Allergic/Immunologic: Negative.  Negative for environmental allergies and food allergies.  Neurological: Negative.  Negative for dizziness, syncope, weakness and headaches.  Hematological: Negative.  Negative for adenopathy. Does not bruise/bleed easily.  Psychiatric/Behavioral: Negative.  Negative for agitation and sleep disturbance. The patient is not nervous/anxious.       Objective:    Physical Exam Vitals reviewed.  Constitutional:      Appearance: Normal appearance. He is well-developed. He is not diaphoretic.  HENT:     Head: Normocephalic and atraumatic.     Nose: No nasal deformity, septal deviation, mucosal edema or rhinorrhea.     Right Sinus: No maxillary sinus tenderness or frontal sinus tenderness.     Left Sinus: No maxillary sinus tenderness or frontal sinus tenderness.     Mouth/Throat:     Mouth: Mucous membranes are moist.     Pharynx: No oropharyngeal exudate or posterior oropharyngeal erythema.  Eyes:     General:  No scleral icterus.    Conjunctiva/sclera: Conjunctivae normal.     Pupils: Pupils are equal, round, and reactive to light.  Neck:     Thyroid: No thyromegaly.     Vascular: No carotid bruit or JVD.     Trachea: Trachea normal. No tracheal tenderness or tracheal deviation.  Cardiovascular:  Rate and Rhythm: Normal rate and regular rhythm.     Chest Wall: PMI is not displaced.     Pulses: Normal pulses. No decreased pulses.     Heart sounds: Normal heart sounds, S1 normal and S2 normal. Heart sounds not distant. No murmur heard.    No systolic murmur is present.     No diastolic murmur is present.     No friction rub. No gallop. No S3 or S4 sounds.  Pulmonary:     Effort: No tachypnea, accessory muscle usage or respiratory distress.     Breath sounds: No stridor. No decreased breath sounds, wheezing, rhonchi or rales.  Chest:     Chest wall: No tenderness.  Abdominal:     General: Bowel sounds are normal. There is no distension.     Palpations: Abdomen is soft. Abdomen is not rigid.     Tenderness: There is abdominal tenderness. There is no guarding or rebound.  Musculoskeletal:        General: Tenderness present.     Cervical back: Normal range of motion and neck supple. No edema, erythema or rigidity. No muscular tenderness. Normal range of motion.     Comments: Decreased rom of both shoulders.    Lymphadenopathy:     Head:     Right side of head: No submental or submandibular adenopathy.     Left side of head: No submental or submandibular adenopathy.     Cervical: No cervical adenopathy.  Skin:    General: Skin is warm and dry.     Coloration: Skin is not pale.     Findings: No rash.     Nails: There is no clubbing.  Neurological:     General: No focal deficit present.     Mental Status: He is alert and oriented to person, place, and time.     Sensory: No sensory deficit.  Psychiatric:        Mood and Affect: Mood normal.        Speech: Speech normal.         Behavior: Behavior normal.        Thought Content: Thought content normal.        Judgment: Judgment normal.     BP 125/63 (BP Location: Left Arm, Patient Position: Sitting, Cuff Size: Normal)   Pulse 62   Ht 5\' 1"  (1.549 m)   Wt 167 lb 9.6 oz (76 kg)   SpO2 95%   BMI 31.67 kg/m  Wt Readings from Last 3 Encounters:  09/02/22 167 lb 9.6 oz (76 kg)  05/18/22 169 lb 9.6 oz (76.9 kg)  04/22/22 160 lb (72.6 kg)     Health Maintenance Due  Topic Date Due   COVID-19 Vaccine (4 - 2023-24 season) 09/04/2021    There are no preventive care reminders to display for this patient.  Lab Results  Component Value Date   TSH 2.080 04/25/2019   Lab Results  Component Value Date   WBC 10.4 05/25/2021   HGB 15.1 05/25/2021   HCT 44.3 05/25/2021   MCV 86 05/25/2021   PLT 463 (H) 05/25/2021   Lab Results  Component Value Date   NA 140 03/30/2022   K 4.2 03/30/2022   CO2 19 (L) 03/30/2022   GLUCOSE 120 (H) 03/30/2022   BUN 18 03/30/2022   CREATININE 0.85 03/30/2022   BILITOT 0.6 03/30/2022   ALKPHOS 117 03/30/2022   AST 21 03/30/2022   ALT 16 03/30/2022   PROT 7.9 03/30/2022  ALBUMIN 4.6 03/30/2022   CALCIUM 10.1 03/30/2022   EGFR 87 03/30/2022   Lab Results  Component Value Date   CHOL 195 05/09/2019   Lab Results  Component Value Date   HDL 64 05/09/2019   Lab Results  Component Value Date   LDLCALC 112 (H) 05/09/2019   Lab Results  Component Value Date   TRIG 110 05/09/2019   Lab Results  Component Value Date   CHOLHDL 3.0 05/09/2019   No results found for: "HGBA1C"    Assessment & Plan:   Problem List Items Addressed This Visit       Cardiovascular and Mediastinum   Essential hypertension    Controlled with amlodipine and valsartan continue same      Relevant Medications   amLODipine (NORVASC) 10 MG tablet   valsartan (DIOVAN) 160 MG tablet     Musculoskeletal and Integument   Cervical disc disease    Refer back to neurosurgery to see if we  can get an in person interpreter get injections done on neck      Relevant Orders   Ambulatory referral to Neurosurgery     Other   Urinary frequency   Relevant Orders   Ambulatory referral to Urology   Urine Culture   Urinalysis   Dysuria   Relevant Orders   Ambulatory referral to Urology   Urine Culture   Urinalysis   Hematuria - Primary    Referral back to urology      Relevant Orders   Ambulatory referral to Urology   Urine Culture   Urinalysis   Other Visit Diagnoses     Bilateral hearing loss, unspecified hearing loss type       Relevant Orders   Ambulatory referral to Audiology   Encounter for immunization       Relevant Orders   Flu vaccine trivalent PF, 6mos and older(Flulaval,Afluria,Fluarix,Fluzone) (Completed)       Meds ordered this encounter  Medications   amLODipine (NORVASC) 10 MG tablet    Sig: Take 1 tablet (10 mg total) by mouth daily.    Dispense:  90 tablet    Refill:  2   tamsulosin (FLOMAX) 0.4 MG CAPS capsule    Sig: Take 2 capsules (0.8 mg total) by mouth daily.    Dispense:  60 capsule    Refill:  3    Future refill   valsartan (DIOVAN) 160 MG tablet    Sig: Take 1 tablet (160 mg total) by mouth daily.    Dispense:  90 tablet    Refill:  3  Follow-up: Return in about 4 months (around 01/02/2023) for primary care follow up.   30 minutes needed extra time needed because of language barrier Shan Levans, MD

## 2022-09-02 NOTE — Telephone Encounter (Signed)
Patrick Ortega can you help this patient every time he goes or calls neurosurgery he is by himself and they do not understand him apparently they do not provide any Spanish interpreter.  This is a barrier for him he needs neck injections with severe cervical disc disease they follow him.  Can we get him an in person interpreter once he gets his return visit with neurosurgery and perhaps somebody who speaks Spanish to get him another appointment because he is unable to speak to them

## 2022-09-02 NOTE — Progress Notes (Signed)
Flu vaccine

## 2022-09-02 NOTE — Assessment & Plan Note (Signed)
Referral back to urology

## 2022-09-02 NOTE — Assessment & Plan Note (Signed)
Controlled with amlodipine and valsartan continue same

## 2022-09-02 NOTE — Assessment & Plan Note (Signed)
Referral to audiology.

## 2022-09-02 NOTE — Assessment & Plan Note (Signed)
Patient is hard of hearing would benefit from an in person interpreter

## 2022-09-03 ENCOUNTER — Telehealth: Payer: Self-pay

## 2022-09-03 LAB — URINALYSIS
Bilirubin, UA: NEGATIVE
Glucose, UA: NEGATIVE
Ketones, UA: NEGATIVE
Leukocytes,UA: NEGATIVE
Nitrite, UA: NEGATIVE
Protein,UA: NEGATIVE
RBC, UA: NEGATIVE
Specific Gravity, UA: 1.016 (ref 1.005–1.030)
Urobilinogen, Ur: 0.2 mg/dL (ref 0.2–1.0)
pH, UA: 5.5 (ref 5.0–7.5)

## 2022-09-03 NOTE — Telephone Encounter (Signed)
Pt was called and no vm was left due to mailbox not being set up.Information was sent to nurse pool.   Interpreter id #[id number]

## 2022-09-03 NOTE — Telephone Encounter (Signed)
-----   Message from Shan Levans sent at 09/03/2022  8:25 AM EDT ----- Let pt know urine is entirely normal  no blood

## 2022-09-03 NOTE — Progress Notes (Signed)
Let pt know urine is entirely normal  no blood

## 2022-09-04 LAB — URINE CULTURE

## 2022-09-09 NOTE — Telephone Encounter (Signed)
Patrick Ortega is going to speak with the patient's son, Patrick Ortega, to inquire about his ability to accompany the patient to his appointments.  Patrick Ortega has spoken to Patrick Ortega in the past.  When I spoke to the patient in June, he only wanted to go to an appt when one of his children can go with him and the times they are available are extremely limited- he only wanted appts on certain days after 4pm.  After Patrick Ortega speaks with Patrick Ortega, I will call neurosurgery about upcoming appointments.

## 2022-09-10 ENCOUNTER — Other Ambulatory Visit: Payer: Self-pay

## 2022-09-19 NOTE — Progress Notes (Unsigned)
Established Patient Office Visit  Subjective:  Patient ID: Patrick Ortega, male    DOB: 05-Apr-1940  Age: 82 y.o. MRN: [id number]  No chief complaint on file.     HPI 03/10/21 Patrick Ortega presents for primary care follow-up.  This patient was seen with [facility] interpreter audio Spanish andy [phone] Patient continues to have neck and shoulder pain.  He is being evaluated extensively by orthopedics and has follow-up appointments over the next 2 weeks for nerve conduction study and shoulder evaluations.  He continues to complain of the pain today and I reminded him and his son who is with him today who his name is Patrick Ortega these appointments are available and he should follow-up on them.  Another issue is he complains of irritation at the end of his penis burning on urination and difficulty urinating.  He been seen by urology previously was given high-dose Flomax and was told to follow-up expectantly on his PSA that was elevated at 7.0 he does not have follow-up appointments with urology currently.  He was last seen by urology December 2021.  05/24/21  Patient is seen today as a work in visit and the visit was assisted by [place] video interpreter [phone] Since the last visit patient has been seen by urology they performed a part prostate biopsy which was negative for malignancy he has been maintained on tamsulosin  Today though the patient comes in with burning sensation at the tip of his penis and on and around the uncircumcised area of the penis.  He has some blood intermittently from the head of the penis and sometimes in the urine.  He has some burning on urination. The patient also complains of neck discomfort but he underwent EMG nerve conduction study with orthopedics which did not show evidence of nerve root compression on the cervical spine.  He does not need injections in the neck area.  He did have shoulder work performed.  7/17 Patient seen in return visit visit was assisted by  Spanish video interpreter [name] [phone] Patient planes of neck pain and throat pain over the last week.  He states the lesion on his penis is improved but would like a refill on the Zovirax.  Note on arrival blood pressure is elevated 147/72. The patient has been up to now on the amlodipine 10 mg daily chlorthalidone 25 mg daily.  He states he has been compliant with this medication.   9/19 Patient seen in return follow-up Spanish interpreter [name] #[phone] assisted.  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  He has no other real complaints at this visit.  Unfortunately blood pressure on arrival elevated 146/69 and it turns out he never filled his amlodipine or his chlorthalidone we wrote for the last visit in July.  He has been taking the low-dose valsartan 80 mg daily.  12/21 Patient is seen in follow-up visit assisted by Spanish interpreter [name] [phone].  Patient states since being on gabapentin his hand numbness has not changed and he is having more balance and visual changes for 1 month.  He did also have to stop his blood pressure medicines when became quite dizzy.  Note off blood pressure medications he is 114/73 on arrival.  Patient is taking tamsulosin 0.8 mg daily and states this does help his prostate flow however he still not emptying his bladder fully and still has dysuria.  The acyclovir we applied to the end  of his penis has not been of any benefit to him.  Patient has no other complaints.  03/30/22 This patient was seen previously and February by [facility] after I saw the patient in December.  Today's visit assisted by Spanish video interpreter [name] [phone]  Patient continues to planing of penis pain did see urology the cream that was prescribed caused pain and he quit taking it he would like to have the acyclovir or back that we gave him previously.  On arrival blood pressure elevated 190/92  recheck remains elevated.  Patient does have cervical disc disease but his neck pain is better.  He states Neurontin did not help him he is not taking this.  Patient has been on acyclovir in the past with some improvement.  He is not taking the tamsulosin needs to be refilled.  05/18/22 Patient returns today still complaining of neck and shoulder pain.  Visit was assisted by Spanish interpreter [name] [phone].  Blood pressure initially elevated but on recheck 125/67. Patient is recently saw urology and he is stable with his PSA and he is on the tamsulosin doing well with this has no urinary complaints today.  The largest issue is that he has significant cervical disc disease and is yet to be able to see neurosurgery.  Upon investigation they have been trying to call him all through February and March and left voicemails with no answer.  Upon further investigation it appears family members must take him to these appointments and they have not been notified as well.  09/19/2022 Patient is seen in return follow-up Spanish interpreter [name] [phone] assisted in visit.  Patient still having neck pain is yet to get injections with neurosurgery as prescribed.  There appears to be a language barrier getting him into the clinic.  When he calls nobody answers or they cannot run understand him.  He also has hearing deficit.  Has dysuria and hematuria was seen previously by urology needs follow-up visit.  The acyclovir we have been giving on the tip of his penis is of no use and does not help him.  There are no other complaints. Past Medical History:  Diagnosis Date   GERD (gastroesophageal reflux disease)    Hypertension     Past Surgical History:  Procedure Laterality Date   NO PAST SURGERIES      Family History  Problem Relation Age of Onset   Ulcers Mother     Social History   Socioeconomic History   Marital status: Married    Spouse name: Not on file   Number of children: Not on file   Years  of education: Not on file   Highest education level: Not on file  Occupational History   Not on file  Tobacco Use   Smoking status: Former    Current packs/day: 0.25    Average packs/day: 0.3 packs/day for 0.5 years (0.1 ttl pk-yrs)    Types: Cigarettes   Smokeless tobacco: Never  Vaping Use   Vaping status: Never Used  Substance and Sexual Activity   Alcohol use: Not Currently   Drug use: Not on file   Sexual activity: Yes  Other Topics Concern   Not on file  Social History Narrative   Not on file   Social Determinants of Health   Financial Resource Strain: Low Risk  (04/22/2022)   Overall Financial Resource Strain (CARDIA)    Difficulty of Paying Living Expenses: Not hard at all  Food Insecurity: No Food Insecurity (04/22/2022)  Hunger Vital Sign    Worried About Running Out of Food in the Last Year: Never true    Ran Out of Food in the Last Year: Never true  Transportation Needs: No Transportation Needs (04/22/2022)   PRAPARE - Administrator, Civil Service (Medical): No    Lack of Transportation (Non-Medical): No  Physical Activity: Inactive (04/22/2022)   Exercise Vital Sign    Days of Exercise per Week: 0 days    Minutes of Exercise per Session: 0 min  Stress: No Stress Concern Present (04/22/2022)   [employer] of Occupational Health - Occupational Stress Questionnaire    Feeling of Stress : Not at all  Social Connections: Not on file  Intimate Partner Violence: Not on file    Outpatient Medications Prior to Visit  Medication Sig Dispense Refill   amLODipine (NORVASC) 10 MG tablet Take 1 tablet (10 mg total) by mouth daily. 90 tablet 2   tamsulosin (FLOMAX) 0.4 MG CAPS capsule Take 2 capsules (0.8 mg total) by mouth daily. 60 capsule 3   valsartan (DIOVAN) 160 MG tablet Take 1 tablet (160 mg total) by mouth daily. 90 tablet 3   No facility-administered medications prior to visit.    No Known Allergies  ROS Review of Systems   Constitutional: Negative.   HENT:  Negative for ear pain, postnasal drip, rhinorrhea, sinus pressure, sore throat, trouble swallowing and voice change.   Eyes: Negative.   Respiratory: Negative.  Negative for apnea, cough, choking, chest tightness, shortness of breath, wheezing and stridor.   Cardiovascular: Negative.  Negative for chest pain, palpitations and leg swelling.  Gastrointestinal: Negative.  Negative for abdominal distention, abdominal pain, nausea and vomiting.  Genitourinary:  Positive for dysuria, hematuria and penile discharge. Negative for difficulty urinating, frequency, penile swelling, scrotal swelling, testicular pain and urgency.  Musculoskeletal:  Positive for neck pain. Negative for arthralgias and myalgias.  Skin: Negative.  Negative for rash.  Allergic/Immunologic: Negative.  Negative for environmental allergies and food allergies.  Neurological: Negative.  Negative for dizziness, syncope, weakness and headaches.  Hematological: Negative.  Negative for adenopathy. Does not bruise/bleed easily.  Psychiatric/Behavioral: Negative.  Negative for agitation and sleep disturbance. The patient is not nervous/anxious.       Objective:    Physical Exam Vitals reviewed.  Constitutional:      Appearance: Normal appearance. He is well-developed. He is not diaphoretic.  HENT:     Head: Normocephalic and atraumatic.     Nose: No nasal deformity, septal deviation, mucosal edema or rhinorrhea.     Right Sinus: No maxillary sinus tenderness or frontal sinus tenderness.     Left Sinus: No maxillary sinus tenderness or frontal sinus tenderness.     Mouth/Throat:     Mouth: Mucous membranes are moist.     Pharynx: No oropharyngeal exudate or posterior oropharyngeal erythema.  Eyes:     General: No scleral icterus.    Conjunctiva/sclera: Conjunctivae normal.     Pupils: Pupils are equal, round, and reactive to light.  Neck:     Thyroid: No thyromegaly.     Vascular: No  carotid bruit or JVD.     Trachea: Trachea normal. No tracheal tenderness or tracheal deviation.  Cardiovascular:     Rate and Rhythm: Normal rate and regular rhythm.     Chest Wall: PMI is not displaced.     Pulses: Normal pulses. No decreased pulses.     Heart sounds: Normal heart sounds, S1 normal and S2  normal. Heart sounds not distant. No murmur heard.    No systolic murmur is present.     No diastolic murmur is present.     No friction rub. No gallop. No S3 or S4 sounds.  Pulmonary:     Effort: No tachypnea, accessory muscle usage or respiratory distress.     Breath sounds: No stridor. No decreased breath sounds, wheezing, rhonchi or rales.  Chest:     Chest wall: No tenderness.  Abdominal:     General: Bowel sounds are normal. There is no distension.     Palpations: Abdomen is soft. Abdomen is not rigid.     Tenderness: There is abdominal tenderness. There is no guarding or rebound.  Musculoskeletal:        General: Tenderness present.     Cervical back: Normal range of motion and neck supple. No edema, erythema or rigidity. No muscular tenderness. Normal range of motion.     Comments: Decreased rom of both shoulders.    Lymphadenopathy:     Head:     Right side of head: No submental or submandibular adenopathy.     Left side of head: No submental or submandibular adenopathy.     Cervical: No cervical adenopathy.  Skin:    General: Skin is warm and dry.     Coloration: Skin is not pale.     Findings: No rash.     Nails: There is no clubbing.  Neurological:     General: No focal deficit present.     Mental Status: He is alert and oriented to person, place, and time.     Sensory: No sensory deficit.  Psychiatric:        Mood and Affect: Mood normal.        Speech: Speech normal.        Behavior: Behavior normal.        Thought Content: Thought content normal.        Judgment: Judgment normal.     There were no vitals taken for this visit. Wt Readings from Last 3  Encounters:  09/02/22 167 lb 9.6 oz (76 kg)  05/18/22 169 lb 9.6 oz (76.9 kg)  04/22/22 160 lb (72.6 kg)     Health Maintenance Due  Topic Date Due   COVID-19 Vaccine (4 - 2023-24 season) 09/05/2022    There are no preventive care reminders to display for this patient.  Lab Results  Component Value Date   TSH 2.080 04/25/2019   Lab Results  Component Value Date   WBC 10.4 05/25/2021   HGB 15.1 05/25/2021   HCT 44.3 05/25/2021   MCV 86 05/25/2021   PLT 463 (H) 05/25/2021   Lab Results  Component Value Date   NA 140 03/30/2022   K 4.2 03/30/2022   CO2 19 (L) 03/30/2022   GLUCOSE 120 (H) 03/30/2022   BUN 18 03/30/2022   CREATININE 0.85 03/30/2022   BILITOT 0.6 03/30/2022   ALKPHOS 117 03/30/2022   AST 21 03/30/2022   ALT 16 03/30/2022   PROT 7.9 03/30/2022   ALBUMIN 4.6 03/30/2022   CALCIUM 10.1 03/30/2022   EGFR 87 03/30/2022   Lab Results  Component Value Date   CHOL 195 05/09/2019   Lab Results  Component Value Date   HDL 64 05/09/2019   Lab Results  Component Value Date   LDLCALC 112 (H) 05/09/2019   Lab Results  Component Value Date   TRIG 110 05/09/2019   Lab Results  Component Value  Date   CHOLHDL 3.0 05/09/2019   No results found for: "HGBA1C"    Assessment & Plan:   Problem List Items Addressed This Visit   None    No orders of the defined types were placed in this encounter. Follow-up: No follow-ups on file.   30 minutes needed extra time needed because of language barrier Shan Levans, MD

## 2022-09-21 ENCOUNTER — Encounter: Payer: Self-pay | Admitting: Critical Care Medicine

## 2022-09-21 ENCOUNTER — Other Ambulatory Visit: Payer: Self-pay

## 2022-09-21 ENCOUNTER — Ambulatory Visit: Payer: Medicare Other | Attending: Critical Care Medicine | Admitting: Critical Care Medicine

## 2022-09-21 ENCOUNTER — Telehealth: Payer: Self-pay

## 2022-09-21 VITALS — BP 137/72 | HR 91 | Wt 168.0 lb

## 2022-09-21 DIAGNOSIS — M5090 Cervical disc disorder, unspecified, unspecified cervical region: Secondary | ICD-10-CM

## 2022-09-21 DIAGNOSIS — K219 Gastro-esophageal reflux disease without esophagitis: Secondary | ICD-10-CM | POA: Diagnosis not present

## 2022-09-21 DIAGNOSIS — Z603 Acculturation difficulty: Secondary | ICD-10-CM | POA: Diagnosis not present

## 2022-09-21 DIAGNOSIS — M542 Cervicalgia: Secondary | ICD-10-CM | POA: Diagnosis present

## 2022-09-21 DIAGNOSIS — M25612 Stiffness of left shoulder, not elsewhere classified: Secondary | ICD-10-CM | POA: Insufficient documentation

## 2022-09-21 DIAGNOSIS — I10 Essential (primary) hypertension: Secondary | ICD-10-CM | POA: Diagnosis not present

## 2022-09-21 DIAGNOSIS — M25611 Stiffness of right shoulder, not elsewhere classified: Secondary | ICD-10-CM | POA: Diagnosis not present

## 2022-09-21 DIAGNOSIS — Z556 Problems related to health literacy: Secondary | ICD-10-CM | POA: Insufficient documentation

## 2022-09-21 DIAGNOSIS — M5030 Other cervical disc degeneration, unspecified cervical region: Secondary | ICD-10-CM | POA: Insufficient documentation

## 2022-09-21 NOTE — Assessment & Plan Note (Signed)
Patient would benefit from injections on the neck to relieve the patient's pain we called today and he actually has an appointment scheduled in a week and we told the patient of the time and also will communicate with the patient's son so he can accompany with the patient

## 2022-09-21 NOTE — Telephone Encounter (Signed)
Patient in the office today for scheduled appointment . Patient was confused about upcoming appointment with spine and pain appointment . Patient advised that appointment is on 09/28/2022 at 4:00pm.

## 2022-09-22 ENCOUNTER — Other Ambulatory Visit: Payer: Self-pay

## 2022-09-28 DIAGNOSIS — M5412 Radiculopathy, cervical region: Secondary | ICD-10-CM | POA: Diagnosis not present

## 2022-10-18 ENCOUNTER — Other Ambulatory Visit: Payer: Self-pay

## 2022-10-18 DIAGNOSIS — M5412 Radiculopathy, cervical region: Secondary | ICD-10-CM | POA: Diagnosis not present

## 2022-10-18 DIAGNOSIS — M4802 Spinal stenosis, cervical region: Secondary | ICD-10-CM | POA: Diagnosis not present

## 2022-11-01 ENCOUNTER — Ambulatory Visit (INDEPENDENT_AMBULATORY_CARE_PROVIDER_SITE_OTHER): Payer: Medicare Other | Admitting: Primary Care

## 2022-11-04 ENCOUNTER — Other Ambulatory Visit: Payer: Self-pay | Admitting: Physician Assistant

## 2022-11-04 ENCOUNTER — Other Ambulatory Visit: Payer: Self-pay

## 2022-11-04 ENCOUNTER — Ambulatory Visit: Payer: Medicare Other | Admitting: Physician Assistant

## 2022-11-04 DIAGNOSIS — M5090 Cervical disc disorder, unspecified, unspecified cervical region: Secondary | ICD-10-CM

## 2022-11-04 MED ORDER — MELOXICAM 7.5 MG PO TABS
7.5000 mg | ORAL_TABLET | Freq: Every day | ORAL | 1 refills | Status: DC
  Filled 2022-11-04: qty 30, 30d supply, fill #0

## 2022-12-06 ENCOUNTER — Other Ambulatory Visit: Payer: Self-pay

## 2022-12-13 DIAGNOSIS — Z6833 Body mass index (BMI) 33.0-33.9, adult: Secondary | ICD-10-CM | POA: Diagnosis not present

## 2022-12-13 DIAGNOSIS — M5412 Radiculopathy, cervical region: Secondary | ICD-10-CM | POA: Diagnosis not present

## 2022-12-23 ENCOUNTER — Other Ambulatory Visit: Payer: Self-pay

## 2023-01-13 ENCOUNTER — Encounter: Payer: Self-pay | Admitting: Family Medicine

## 2023-01-13 ENCOUNTER — Other Ambulatory Visit: Payer: Self-pay

## 2023-01-13 ENCOUNTER — Ambulatory Visit: Payer: Medicare Other | Attending: Family Medicine | Admitting: Family Medicine

## 2023-01-13 VITALS — BP 120/73 | HR 125 | Ht 61.0 in | Wt 168.4 lb

## 2023-01-13 DIAGNOSIS — M1612 Unilateral primary osteoarthritis, left hip: Secondary | ICD-10-CM | POA: Diagnosis not present

## 2023-01-13 DIAGNOSIS — Z87891 Personal history of nicotine dependence: Secondary | ICD-10-CM | POA: Insufficient documentation

## 2023-01-13 DIAGNOSIS — N400 Enlarged prostate without lower urinary tract symptoms: Secondary | ICD-10-CM | POA: Insufficient documentation

## 2023-01-13 DIAGNOSIS — M5412 Radiculopathy, cervical region: Secondary | ICD-10-CM | POA: Diagnosis not present

## 2023-01-13 DIAGNOSIS — I10 Essential (primary) hypertension: Secondary | ICD-10-CM | POA: Diagnosis not present

## 2023-01-13 DIAGNOSIS — R519 Headache, unspecified: Secondary | ICD-10-CM | POA: Insufficient documentation

## 2023-01-13 DIAGNOSIS — L739 Follicular disorder, unspecified: Secondary | ICD-10-CM | POA: Insufficient documentation

## 2023-01-13 DIAGNOSIS — R000 Tachycardia, unspecified: Secondary | ICD-10-CM | POA: Insufficient documentation

## 2023-01-13 DIAGNOSIS — R9431 Abnormal electrocardiogram [ECG] [EKG]: Secondary | ICD-10-CM | POA: Diagnosis not present

## 2023-01-13 MED ORDER — BACITRACIN 500 UNIT/GM EX OINT
1.0000 | TOPICAL_OINTMENT | Freq: Two times a day (BID) | CUTANEOUS | 0 refills | Status: DC
  Filled 2023-01-13: qty 30, 17d supply, fill #0

## 2023-01-13 MED ORDER — LIDOCAINE 5 % EX PTCH
1.0000 | MEDICATED_PATCH | CUTANEOUS | 1 refills | Status: DC
  Filled 2023-01-13: qty 30, 30d supply, fill #0
  Filled 2023-02-10: qty 30, 30d supply, fill #1

## 2023-01-13 NOTE — Progress Notes (Signed)
 Subjective:  Patient ID: Patrick Ortega, male    DOB: 31-Oct-1940  Age: 83 y.o. MRN: [id number]  CC: Medical Management of Chronic Issues   HPI Osker Ayoub is a 83 y.o. year old male with a history of hypertension, left hip osteoarthritis, BPH, cervical radiculopathy.  Interval History: Discussed the use of AI scribe software for clinical note transcription with the patient, who gave verbal consent to proceed.  He presents with multiple complaints. He reports feeling unwell due to high blood pressure, although it is unclear if this was self-measured or diagnosed by a healthcare professional.  Blood pressure in the clinic is normal.  He also describes a burning headache and watery eyes, which have been ongoing for about a month. The patient is unsure if these symptoms are related. He also reports bumps on the back of his head, which are itchy.  In addition to these symptoms, the patient has been experiencing neck and shoulder pain for about a month. The pain is described as a stabbing sensation that extends from the side of the neck to the back. The patient has seen a doctor for this issue in the past and was prescribed medication, but he reports that the medication was too strong and caused a tingling sensation and headache. The patient stopped taking the medication due to these side effects.    He is under the care of [facility] - Dr Debby for management of cervical radiculopathy.  His MRI from 03/2020 revealed severe C6 foraminal stenosis, C3-C4 C4-C5 central disc protrusions, degenerative disc disease and cervical spine  Past Medical History:  Diagnosis Date   GERD (gastroesophageal reflux disease)    Hypertension     Past Surgical History:  Procedure Laterality Date   NO PAST SURGERIES      Family History  Problem Relation Age of Onset   Ulcers Mother     Social History   Socioeconomic History   Marital status: Married    Spouse name: Not on file    Number of children: Not on file   Years of education: Not on file   Highest education level: Not on file  Occupational History   Not on file  Tobacco Use   Smoking status: Former    Current packs/day: 0.25    Average packs/day: 0.3 packs/day for 0.5 years (0.1 ttl pk-yrs)    Types: Cigarettes   Smokeless tobacco: Never  Vaping Use   Vaping status: Never Used  Substance and Sexual Activity   Alcohol use: Not Currently   Drug use: Not on file   Sexual activity: Yes  Other Topics Concern   Not on file  Social History Narrative   Not on file   Social Drivers of Health   Financial Resource Strain: Low Risk  (04/22/2022)   Overall Financial Resource Strain (CARDIA)    Difficulty of Paying Living Expenses: Not hard at all  Food Insecurity: No Food Insecurity (04/22/2022)   Hunger Vital Sign    Worried About Running Out of Food in the Last Year: Never true    Ran Out of Food in the Last Year: Never true  Transportation Needs: No Transportation Needs (04/22/2022)   PRAPARE - Administrator, Civil Service (Medical): No    Lack of Transportation (Non-Medical): No  Physical Activity: Inactive (04/22/2022)   Exercise Vital Sign    Days of Exercise per Week: 0 days    Minutes of Exercise per Session: 0 min  Stress: No  Stress Concern Present (04/22/2022)   [employer] of Occupational Health - Occupational Stress Questionnaire    Feeling of Stress : Not at all  Social Connections: Not on file    No Known Allergies  Outpatient Medications Prior to Visit  Medication Sig Dispense Refill   amLODipine  (NORVASC ) 10 MG tablet Take 1 tablet (10 mg total) by mouth daily. 90 tablet 2   meloxicam  (MOBIC ) 7.5 MG tablet Take 1 tablet (7.5 mg total) by mouth daily. 30 tablet 1   tamsulosin  (FLOMAX ) 0.4 MG CAPS capsule Take 2 capsules (0.8 mg total) by mouth daily. 60 capsule 3   valsartan  (DIOVAN ) 160 MG tablet Take 1 tablet (160 mg total) by mouth daily. 90 tablet 3   No  facility-administered medications prior to visit.     ROS Review of Systems  Constitutional:  Negative for activity change and appetite change.  HENT:  Negative for sinus pressure and sore throat.   Eyes:  Negative for visual disturbance.  Respiratory:  Negative for cough, chest tightness and shortness of breath.   Cardiovascular:  Negative for chest pain and leg swelling.  Gastrointestinal:  Negative for abdominal distention, abdominal pain, constipation and diarrhea.  Endocrine: Negative.   Genitourinary:  Negative for dysuria.  Musculoskeletal:  Positive for neck pain. Negative for joint swelling and myalgias.  Skin:  Positive for rash.  Allergic/Immunologic: Negative.   Neurological:  Positive for headaches. Negative for weakness, light-headedness and numbness.  Psychiatric/Behavioral:  Negative for dysphoric mood and suicidal ideas.     Objective:  BP 120/73   Pulse (!) 125   Ht 5' 1 (1.549 m)   Wt 168 lb 6.4 oz (76.4 kg)   SpO2 97%   BMI 31.82 kg/m      01/13/2023    9:01 AM 09/21/2022    5:20 PM 09/21/2022    4:22 PM  BP/Weight  Systolic BP 120 137 162  Diastolic BP 73 72 87  Wt. (Lbs) 168.4  168  BMI 31.82 kg/m2  31.74 kg/m2      Physical Exam Constitutional:      Appearance: He is well-developed.  Neck:     Comments: Tenderness to palpation of posterior neck Cardiovascular:     Rate and Rhythm: Tachycardia present.     Heart sounds: Normal heart sounds. No murmur heard. Pulmonary:     Effort: Pulmonary effort is normal.     Breath sounds: Normal breath sounds. No wheezing or rales.  Chest:     Chest wall: No tenderness.  Abdominal:     General: Bowel sounds are normal. There is no distension.     Palpations: Abdomen is soft. There is no mass.     Tenderness: There is no abdominal tenderness.  Musculoskeletal:        General: Normal range of motion.     Right lower leg: No edema.     Left lower leg: No edema.  Skin:    Comments: Bumps on lower  hairline with no erythema  Neurological:     Mental Status: He is alert and oriented to person, place, and time.     Comments: Normal handgrip bilaterally  Psychiatric:        Mood and Affect: Mood normal.        Latest Ref Rng & Units 03/30/2022    4:26 PM 03/10/2021    4:41 PM 07/09/2020    5:03 PM  CMP  Glucose 70 - 99 mg/dL 879  95  89  BUN 8 - 27 mg/dL 18  16  22    Creatinine 0.76 - 1.27 mg/dL 9.14  8.98  8.98   Sodium 134 - 144 mmol/L 140  140  139   Potassium 3.5 - 5.2 mmol/L 4.2  4.5  3.4   Chloride 96 - 106 mmol/L 101  99  98   CO2 20 - 29 mmol/L 19  27    Calcium 8.6 - 10.2 mg/dL 89.8  89.4  89.8   Total Protein 6.0 - 8.5 g/dL 7.9  8.6  8.0   Total Bilirubin 0.0 - 1.2 mg/dL 0.6  0.5  0.3   Alkaline Phos 44 - 121 IU/L 117  119  118   AST 0 - 40 IU/L 21  25  26    ALT 0 - 44 IU/L 16  18      Lipid Panel     Component Value Date/Time   CHOL 195 05/09/2019 1048   TRIG 110 05/09/2019 1048   HDL 64 05/09/2019 1048   CHOLHDL 3.0 05/09/2019 1048   CHOLHDL 3.1 05/15/2014 1052   VLDL 31 05/15/2014 1052   LDLCALC 112 (H) 05/09/2019 1048    CBC    Component Value Date/Time   WBC 10.4 05/25/2021 1648   WBC 15.2 (H) 06/25/2014 1554   WBC 12.1 (H) 05/15/2014 1052   RBC 5.17 05/25/2021 1648   RBC 5.18 06/25/2014 1554   RBC 5.05 05/15/2014 1052   HGB 15.1 05/25/2021 1648   HGB 14.4 06/25/2014 1554   HCT 44.3 05/25/2021 1648   HCT 44.1 06/25/2014 1554   PLT 463 (H) 05/25/2021 1648   MCV 86 05/25/2021 1648   MCV 85.2 06/25/2014 1554   MCH 29.2 05/25/2021 1648   MCH 27.7 06/25/2014 1554   MCH 28.3 05/15/2014 1052   MCHC 34.1 05/25/2021 1648   MCHC 32.5 06/25/2014 1554   MCHC 32.9 05/15/2014 1052   RDW 14.1 05/25/2021 1648   RDW 13.6 06/25/2014 1554   LYMPHSABS 3.9 (H) 05/25/2021 1648   LYMPHSABS 4.7 (H) 06/25/2014 1554   MONOABS 1.5 (H) 06/25/2014 1554   EOSABS 0.1 05/25/2021 1648   BASOSABS 0.1 05/25/2021 1648   BASOSABS 0.1 06/25/2014 1554    No results  found for: HGBA1C  Assessment & Plan:     Tachycardia This is a new finding -Will send off thyroid  panel as workup -EKG reveals sinus tachycardia of 105 with inferior infarct -Will refer to cardiology  Hypertension Patient reports feeling unwell due to high blood pressure, however, blood pressure measured in clinic was normal. -Continue current management and monitor blood pressure regularly.  Headache  Patient reports a burning sensation in the head, posterior headache and watery eyes for the past month. -Advised to use OTC analgesic Unsure if this is related to his cervical radiculopathy -Refer to specialist for further evaluation and management.  Folliculitis Patient reports bumps on the back of the head that itch. -Prescribe antibiotic cream for topical application.  Cervical radiculopathy Chronic pain reported in the neck and shoulder area. Patient is currently under the care of Dr. Debby for this issue. -Refer to physical therapy for exercises and massage. -Continue current management under Dr. Debby.           Meds ordered this encounter  Medications   bacitracin  500 UNIT/GM ointment    Sig: Apply 1 Application topically 2 (two) times daily.    Dispense:  30 g    Refill:  0   lidocaine  (LIDODERM ) 5 %  Sig: Place 1 patch onto the skin daily. Remove & Discard patch within 12 hours or as directed by MD    Dispense:  30 patch    Refill:  1    Follow-up: Return in about 6 months (around 07/13/2023) for Chronic medical conditions.       Corrina Sabin, MD, FAAFP. [facility] [place], [place] [phone]   01/13/2023, 12:32 PM

## 2023-01-13 NOTE — Patient Instructions (Signed)
 Radiculopata cervical Cervical Radiculopathy  La radiculopata cervical se presenta cuando un nervio del cuello (un nervio cervical) est comprimido o daado. Esta afeccin puede ocurrir debido a una lesin en la columna vertebral cervical (vrtebras) del cuello, o como parte del proceso de envejecimiento normal. La compresin de los nervios cervicales puede causar dolor o adormecimiento que se extiende desde el cuello hasta el brazo y los dedos de la [place]. Esta afeccin generalmente mejora con reposo. Si no mejora, tal vez sea necesario administrar un tratamiento. Cules son las causas? Esta afeccin puede ser causada por lo siguiente: Lesin en el cuello. Un abombamiento (hernia) discal. Espasmos musculares. Rigidez de los msculos del cuello debido al uso excesivo. Artritis. Fractura o degeneracin de los huesos y las articulaciones de la columna (espondiloartrosis) debido al envejecimiento. Espolones seos que pueden formarse cerca de los nervios cervicales. Cules son los signos o sntomas? Los sntomas de esta afeccin incluyen: Engineer, mining. El dolor puede extenderse desde el cuello hasta el brazo y [place]. El dolor puede ser intenso o [place]. Puede empeorar cuando mueve el cuello. Adormecimiento u hormigueo en el brazo o la mano. Debilidad en el brazo y la mano afectados, en casos graves. Cmo se diagnostica? Esta afeccin se puede diagnosticar en funcin de los sntomas, la historia clnica y los antecedentes mdicos. Tambin pueden hacerle estudios, que incluyen los siguientes: Radiografas. Exploracin por tomografa computarizada (TC). Resonancia magntica (RM). Electromiograma (EMG). Pruebas de conduccin nerviosa. Cmo se trata? En muchos casos, no se requiere tratamiento para esta afeccin. Con reposo, esta suele mejorar con [employer]. Si es Publishing rights manager, las opciones pueden incluir lo siguiente: Usar un collarn cervical blando durante perodos  cortos. Hacer fisioterapia para fortalecer los msculos del cuello. Usar medicamentos. Estos pueden incluir antiinflamatorios no esteroideos (AINE), como ibuprofeno, o corticoesteroides orales. Aplicarse inyecciones en la columna vertebral, en los casos graves. Someterse a [place]. Esto puede ser necesario si otros tratamientos no son eficaces. Segn la causa de esta afeccin, podrn implementarse diferentes tipos de [place]. Siga estas indicaciones en su casa: Si tiene un collarn cervical: selo como se lo haya indicado el mdico. Quteselo solamente como se lo haya indicado el mdico. Pregntele al mdico si puede quitarse el collarn cervical para baarse e higienizarse. Si lo autorizan a Warehouse manager para baarse o higienizarse: Siga las instrucciones del mdico acerca de cmo quitarse el collarn de manera segura. Para limpiar el collarn, psele un pao con agua y [place], y squelo bien. Quite las almohadillas desmontables del collarn, si las tiene, cada 1 o 2 [address] y [address] a mano con agua y [place]. Djelas que se sequen por completo antes de volver a ponerlas en el collarn. Contrlese la piel debajo del collarn para ver si hay irritacin o llagas. Si presenta alguna de estas, informe a su mdico. Control del dolor     Use los medicamentos de venta libre y los recetados solamente como se lo haya indicado el mdico. Si se lo indican, aplique hielo sobre la zona afectada. Para hacer esto: Si tiene un collarn cervical blando, quteselo como se lo haya indicado el mdico. Ponga el hielo en una bolsa plstica. Coloque una toalla entre la piel y Copy. Aplique el hielo durante 20 minutos, 2 o 3 veces por da. Retire el hielo si la piel se pone de color rojo brillante. Esto es [employer]. Si no puede sentir dolor, calor o fro, tiene un mayor riesgo de que se dae la zona.  Si aplicarse hielo no le Research scientist (life sciences), intente Company secretary. Use la fuente de calor que  el mdico le recomiende, como una compresa de calor hmedo o una almohadilla trmica. Coloque una toalla entre la piel y la fuente de Airline pilot. Aplique calor durante 20 a 30 minutos. Retire la fuente de calor si la piel se pone de color rojo brillante. Esto es especialmente importante si no puede sentir dolor, calor o fro. Corre un mayor riesgo de sufrir quemaduras. Intente darse un masaje suave en el cuello y el hombro para ayudar a Paramedic los sntomas. Actividad Descanse todo lo que sea necesario. Retome sus actividades normales como se lo haya indicado el mdico. Pregntele al mdico qu actividades son seguras para usted. Realice ejercicios de elongacin y fortalecimiento como se lo hayan indicado el mdico o el fisioterapeuta. Es posible que Personnel officer objetos. Pregntele al mdico cunto peso puede levantar sin correr [employer]. Indicaciones generales Use una almohada plana para dormir. No conduzca mientras [place] un collarn cervical. Si no tiene un collarn cervical, pregntele al mdico si es seguro que conduzca durante el proceso de curacin del cuello. Pregntele al mdico si el medicamento recetado le impide conducir o usar [place]. No consuma ningn producto que contenga nicotina o tabaco. Estos productos incluyen cigarrillos, tabaco para Theatre manager y aparatos de vapeo, como los Administrator, Civil Service. Si necesita ayuda para dejar de consumir estos productos, consulte al mdico. Concurra a todas las visitas de seguimiento. Esto es importante. Comunquese con un mdico si: La afeccin no mejora con tratamiento. Solicite ayuda de inmediato si: El dolor se intensifica mucho y no se Chief Executive Officer con los medicamentos. Siente debilidad o adormecimiento en la mano, el brazo, el rostro o la pierna. Tiene fiebre alta. Tiene rigidez de cuello. Pierde el control de la vejiga o los intestinos (tiene incontinencia). Tiene dificultad para caminar, mantener el equilibrio o hablar. Resumen La  radiculopata cervical se presenta cuando un nervio del cuello est comprimido o daado. Un nervio puede pinzarse por un abultamiento discal, artritis, espasmos musculares o una lesin en el cuello. Los sntomas Environmental education officer, hormigueo o adormecimiento que se irradia desde el cuello hacia el brazo o la [place]. En los casos graves, tambin puede presentarse debilidad. El tratamiento puede incluir reposo, fisioterapia y usar un collarn cervical. Pueden recetarle medicamentos para Engineer, materials. En casos graves, tal vez haya que aplicar inyecciones o realizar [place]. Esta informacin no tiene Theme park manager el consejo del mdico. Asegrese de hacerle al mdico cualquier pregunta que tenga. Document Revised: 07/30/2020 Document Reviewed: 07/30/2020 Elsevier Patient Education  2024 [employer].

## 2023-01-14 LAB — CBC WITH DIFFERENTIAL/PLATELET
Basophils Absolute: 0.1 10*3/uL (ref 0.0–0.2)
Basos: 1 %
EOS (ABSOLUTE): 0.1 10*3/uL (ref 0.0–0.4)
Eos: 1 %
Hematocrit: 46.9 % (ref 37.5–51.0)
Hemoglobin: 15.6 g/dL (ref 13.0–17.7)
Immature Grans (Abs): 0 10*3/uL (ref 0.0–0.1)
Immature Granulocytes: 0 %
Lymphocytes Absolute: 2.8 10*3/uL (ref 0.7–3.1)
Lymphs: 25 %
MCH: 29.4 pg (ref 26.6–33.0)
MCHC: 33.3 g/dL (ref 31.5–35.7)
MCV: 89 fL (ref 79–97)
Monocytes Absolute: 0.8 10*3/uL (ref 0.1–0.9)
Monocytes: 7 %
Neutrophils Absolute: 7.5 10*3/uL — ABNORMAL HIGH (ref 1.4–7.0)
Neutrophils: 66 %
Platelets: 438 10*3/uL (ref 150–450)
RBC: 5.3 x10E6/uL (ref 4.14–5.80)
RDW: 14 % (ref 11.6–15.4)
WBC: 11.3 10*3/uL — ABNORMAL HIGH (ref 3.4–10.8)

## 2023-01-14 LAB — CMP14+EGFR
ALT: 15 [IU]/L (ref 0–44)
AST: 20 [IU]/L (ref 0–40)
Albumin: 4.7 g/dL (ref 3.7–4.7)
Alkaline Phosphatase: 121 [IU]/L (ref 44–121)
BUN/Creatinine Ratio: 16 (ref 10–24)
BUN: 18 mg/dL (ref 8–27)
Bilirubin Total: 0.7 mg/dL (ref 0.0–1.2)
CO2: 22 mmol/L (ref 20–29)
Calcium: 10.3 mg/dL — ABNORMAL HIGH (ref 8.6–10.2)
Chloride: 106 mmol/L (ref 96–106)
Creatinine, Ser: 1.14 mg/dL (ref 0.76–1.27)
Globulin, Total: 2.8 g/dL (ref 1.5–4.5)
Glucose: 108 mg/dL — ABNORMAL HIGH (ref 70–99)
Potassium: 4 mmol/L (ref 3.5–5.2)
Sodium: 144 mmol/L (ref 134–144)
Total Protein: 7.5 g/dL (ref 6.0–8.5)
eGFR: 64 mL/min/{1.73_m2} (ref >59)

## 2023-01-14 LAB — T3: T3, Total: 127 ng/dL (ref 71–180)

## 2023-01-14 LAB — T4, FREE: Free T4: 1.43 ng/dL (ref 0.82–1.77)

## 2023-01-14 LAB — TSH: TSH: 3.27 u[IU]/mL (ref 0.450–4.500)

## 2023-01-28 ENCOUNTER — Ambulatory Visit (INDEPENDENT_AMBULATORY_CARE_PROVIDER_SITE_OTHER): Payer: Medicare Other | Admitting: Primary Care

## 2023-01-28 ENCOUNTER — Encounter (INDEPENDENT_AMBULATORY_CARE_PROVIDER_SITE_OTHER): Payer: Self-pay | Admitting: Primary Care

## 2023-01-28 VITALS — BP 148/72 | HR 71 | Resp 16 | Ht 61.0 in | Wt 167.2 lb

## 2023-01-28 DIAGNOSIS — R5383 Other fatigue: Secondary | ICD-10-CM

## 2023-01-28 DIAGNOSIS — R42 Dizziness and giddiness: Secondary | ICD-10-CM

## 2023-01-28 DIAGNOSIS — M5090 Cervical disc disorder, unspecified, unspecified cervical region: Secondary | ICD-10-CM | POA: Diagnosis not present

## 2023-01-28 NOTE — Patient Instructions (Addendum)
[facility]  [address] [place], [place]  [address] Ph# [phone]    [facility] [employer] [facility] [address] Ph# [phone]

## 2023-01-31 NOTE — Progress Notes (Signed)
[facility]  Patrick Ortega, is a 83 y.o. male  ZOX:[id number]  [id number]  DOB - 1940/03/09  Chief Complaint  Patient presents with   Dizziness   Fatigue   Neck Pain    In the shoulder area        Subjective:   Patrick Ortega is a 83 y.o. male here today for a follow up acute visit. Seen by Dr. Alvis Ortega on 01/13/23 with the same c/o cervical radiculopathy . He is under the care of [facility] - Dr Patrick Ortega for management of cervical radiculopathy.  Patient also c/o dizziness - related to positional and fatigue. He denies  has No headache, No chest pain, No abdominal pain - No Nausea, No new weakness tingling or numbness, No Cough - shortness of breath   No problems updated.  Comprehensive ROS Pertinent positive and negative noted in HPI   No Known Allergies  Past Medical History:  Diagnosis Date   GERD (gastroesophageal reflux disease)    Hypertension     Current Outpatient Medications on File Prior to Visit  Medication Sig Dispense Refill   amLODipine (NORVASC) 10 MG tablet Take 1 tablet (10 mg total) by mouth daily. 90 tablet 2   bacitracin 500 UNIT/GM ointment Apply 1 Application topically 2 (two) times daily. 30 g 0   lidocaine (LIDODERM) 5 % Place 1 patch onto the skin daily. Remove & Discard patch within 12 hours or as directed by MD 30 patch 1   meloxicam (MOBIC) 7.5 MG tablet Take 1 tablet (7.5 mg total) by mouth daily. 30 tablet 1   tamsulosin (FLOMAX) 0.4 MG CAPS capsule Take 2 capsules (0.8 mg total) by mouth daily. 60 capsule 3   valsartan (DIOVAN) 160 MG tablet Take 1 tablet (160 mg total) by mouth daily. 90 tablet 3   No current facility-administered medications on file prior to visit.   Health Maintenance  Topic Date Due   COVID-19 Vaccine (4 - 2024-25 season) 09/05/2022   Medicare Annual Wellness Visit  04/22/2023   DTaP/Tdap/Td vaccine (2 - Td or Tdap) 12/09/2029   Pneumonia Vaccine  Completed   Flu Shot  Completed    HPV Vaccine  Aged Out   Zoster (Shingles) Vaccine  Discontinued    Objective:   Vitals:   01/28/23 0919 01/28/23 0920  BP: (!) 153/80 (!) 148/72  Pulse: 71   Resp: 16   SpO2: 96%   Weight: 167 lb 3.2 oz (75.8 kg)   Height: 5\' 1"  (1.549 m)    BP Readings from Last 3 Encounters:  01/28/23 (!) 148/72  01/13/23 120/73  09/21/22 137/72   General: No apparent mild distress. Eyes: Extraocular eye movements intact, pupils equal and round. Neck: Stiff cervical neck pain Thyroid: No enlargement, mobile without fixation, no tenderness. Cardiovascular: Regular rhythm and rate, no murmur, normal radial pulses. Respiratory: Normal respiratory effort, clear to auscultation. Gastrointestinal: Normal pitch active bowel sounds, nontender abdomen without distention or appreciable hepatomegaly.. Musculoskeletal: Wallow gait normal muscle tone, no tenderness on palpation of tibia, no excessive thoracic kyphosis. Skin: Appropriate warmth, no visible rash. Mental status: Alert, conversant, speech clear, thought logical, appropriate mood and affect, no hallucinations or delusions evident. Hematologic/lymphatic: No cervical adenopathy, no visible ecchymoses.    Assessment & Plan   Patrick Ortega was seen today for dizziness, fatigue and neck pain.  Diagnoses and all orders for this visit:  Cervical disc disease Chronic experiencing neck and shoulder pain.  He is followed by [facility] neurology for  the management of cervical cervical radiculopathy.  With stenosis present  Dizziness and giddiness This is positional when he moved to quit or bends down advised to sit find boundaries then proceed to standing, wait a few minutes then proceed to the activity he had attended ongoing  Fatigue, unspecified type Unknown etiology other than sedentary lifestyle thyroid levels and CBC were within normal limits   Follow-up with PCP if problems continue recently seeing Dr. Alvis Ortega with similar complaint Patient have  been counseled extensively about nutrition and exercise. Other issues discussed during this visit include: low cholesterol diet, weight control and daily exercise, foot care, annual eye examinations at Ophthalmology, importance of adherence with medications and regular follow-up. We also discussed long term complications of uncontrolled diabetes and hypertension.  the patient was given clear instructions to go to ER or return to medical center if symptoms don't improve, worsen or new problems develop. The patient verbalized understanding. The patient was told to call to get lab results if they haven't heard anything in the next week.   This note has been created with Education officer, environmental. Any transcriptional errors are unintentional.   Grayce Sessions, NP 02/06/2023, 1:06 AM

## 2023-02-10 ENCOUNTER — Other Ambulatory Visit: Payer: Self-pay

## 2023-02-10 ENCOUNTER — Other Ambulatory Visit: Payer: Self-pay | Admitting: Critical Care Medicine

## 2023-02-11 NOTE — Telephone Encounter (Signed)
 When patient was under Dr. Megan care, he was seen by Urology and Urology recommended high dose tamsulosin . Most often, we maintain a dose of 0.4 mg daily but a higher dose of 0.8mg  daily is sometimes use if symptoms persist with standard 0.4mg . This is quite normal and has proven effective for the patient.

## 2023-02-15 ENCOUNTER — Ambulatory Visit: Payer: Medicare Other | Attending: Family Medicine

## 2023-02-15 DIAGNOSIS — M5412 Radiculopathy, cervical region: Secondary | ICD-10-CM | POA: Insufficient documentation

## 2023-02-15 DIAGNOSIS — M542 Cervicalgia: Secondary | ICD-10-CM | POA: Diagnosis not present

## 2023-02-15 DIAGNOSIS — R293 Abnormal posture: Secondary | ICD-10-CM | POA: Insufficient documentation

## 2023-02-15 DIAGNOSIS — M6281 Muscle weakness (generalized): Secondary | ICD-10-CM | POA: Diagnosis not present

## 2023-02-15 MED ORDER — TAMSULOSIN HCL 0.4 MG PO CAPS
0.8000 mg | ORAL_CAPSULE | Freq: Every day | ORAL | 3 refills | Status: DC
  Filled 2023-02-15: qty 60, 30d supply, fill #0
  Filled 2023-04-28: qty 60, 30d supply, fill #1
  Filled 2023-06-20: qty 60, 30d supply, fill #2
  Filled 2023-07-19: qty 60, 30d supply, fill #3

## 2023-02-15 NOTE — Therapy (Signed)
OUTPATIENT PHYSICAL THERAPY CERVICAL EVALUATION   Patient Name: Patrick Ortega MRN: [id number] DOB:[birth year], 83 y.o., male Today's Date: 02/16/2023  END OF SESSION:  PT End of Session - 02/16/23 0822     Visit Number 1    Number of Visits 9    Date for PT Re-Evaluation 04/13/23    Authorization Type Medicare    PT Start Time 1400    PT Stop Time 1445    PT Time Calculation (min) 45 min    Activity Tolerance Patient tolerated treatment well    Behavior During Therapy [facility] for tasks assessed/performed             Past Medical History:  Diagnosis Date   GERD (gastroesophageal reflux disease)    Hypertension    Past Surgical History:  Procedure Laterality Date   NO PAST SURGERIES     Patient Active Problem List   Diagnosis Date Noted   Hematuria 09/02/2022   Language barrier affecting health care 09/02/2022   Bilateral hearing loss 09/02/2022   Cervical disc disease 05/18/2022   Neck pain 11/09/2021   Bilateral lower extremity edema 11/09/2021   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   Dysuria 05/25/2021   Vitamin D deficiency 07/10/2020   Elevated PSA 12/11/2019   Urinary frequency 12/10/2019   Chronic right shoulder pain 12/10/2019   Essential hypertension 05/24/2014   Osteoarthritis of left hip 05/24/2014    PCP: Patrick Sessions, NP  REFERRING PROVIDER: Hoy Register, MD  REFERRING DIAG: M54.12 (ICD-10-CM) - Cervical radiculopathy   THERAPY DIAG:  Cervicalgia  Muscle weakness (generalized)  Abnormal posture  Rationale for Evaluation and Treatment: Rehabilitation  ONSET DATE: Chronic  SUBJECTIVE:                                                                                                                                                                                                         SUBJECTIVE STATEMENT: Pt presents to PT with granddaughter who helps with interpretation. He notes chronic neck and bilateral upper trap  pain that is made worse with prolonged posturing and yard work. Has occasional symptoms of N/T down R UE, denies bowel/bladder changes or saddle anesthesia. He will frequently self massage in order to decrease pain.   Hand dominance: Right  PERTINENT HISTORY:  HTN  PAIN:  Are you having pain?  Yes: NPRS scale: 7/10 Worst: 10/10 Pain location: neck and bilateral upper traps Pain description: tight, sore, N/T Aggravating factors: prologned sitting, yard work Relieving factors: massage, heat  PRECAUTIONS: None  RED FLAGS: None  WEIGHT BEARING RESTRICTIONS: No  FALLS:  Has patient fallen in last 6 months? No  LIVING ENVIRONMENT: Lives with: lives with their family Lives in: House/apartment  OCCUPATION: retired  PLOF: Independent  PATIENT GOALS: decrease neck pain, improve posture  NEXT MD VISIT: 07/13/2023  OBJECTIVE:  Note: Objective measures were completed at Evaluation unless otherwise noted.  DIAGNOSTIC FINDINGS:  See imaging   PATIENT SURVEYS:  NDI: 35/50  COGNITION: Overall cognitive status: Within functional limits for tasks assessed  SENSATION: WFL  POSTURE: rounded shoulders and forward head  PALPATION: TTP to R upper trapezius    CERVICAL ROM:   Active ROM A/PROM (deg) eval  Flexion   Extension   Right lateral flexion   Left lateral flexion   Right rotation 45  Left rotation 46   (Blank rows = not tested)  UPPER EXTREMITY ROM:  Active ROM Right eval Left eval  Shoulder flexion [facility]  Shoulder extension    Shoulder abduction    Shoulder adduction    Shoulder extension    Shoulder internal rotation    Shoulder external rotation    Elbow flexion    Elbow extension    Wrist flexion    Wrist extension    Wrist ulnar deviation    Wrist radial deviation    Wrist pronation    Wrist supination     (Blank rows = not tested)  UPPER EXTREMITY MMT:  MMT Right eval Left eval  Shoulder flexion    Shoulder extension     Shoulder abduction    Shoulder adduction    Shoulder extension    Shoulder internal rotation    Shoulder external rotation    Middle trapezius 3/5 3/5  Lower trapezius 3/5 3/5  Elbow flexion    Elbow extension    Wrist flexion    Wrist extension    Wrist ulnar deviation    Wrist radial deviation    Wrist pronation    Wrist supination    Grip strength     (Blank rows = not tested)  CERVICAL SPECIAL TESTS:  Upper limb tension test (ULTT): Negative  FUNCTIONAL TESTS:  DNF Endurance Test: 5 seconds   TODAY'S TREATMENT: [facility] Adult PT Treatment:                                                 Therapeutic Exercise: Seated scapular retraction x 10 - 5" hold Seated cervical ext SNAG x 5 - 5" hold Seated upper trap stretch x 30" each Supine chin tuck x 5 - 5" hold Manual Therapy: STM to bilateral upper traps and cervical paraspinals Suboccipital release   PATIENT EDUCATION:  Education details: eval findings, NDI, HEP, POC Person educated: Patient Education method: Explanation, Demonstration, and Handouts Education comprehension: verbalized understanding and returned demonstration  HOME EXERCISE PROGRAM: Access Code: [email or web address] URL: [email or web address] Date: 02/15/2023 Prepared by: Edwinna Areola  Exercises - Seated Scapular Retraction  - 1-2 x daily - 7 x weekly - 2 sets - 10 reps - 3 sec hold - cervical extension snag with towel  - 1-2 x daily - 7 x weekly - 2 sets - 10 reps - 5 sec hold - Seated Upper Trapezius Stretch  - 1-2 x daily - 7 x weekly - 2 reps - 30 sec hold - Supine Chin Tuck  - 1 x daily - 7 x  weekly - 2 sets - 10 reps - 5 sec hold  ASSESSMENT:  CLINICAL IMPRESSION: Patient is a 83 y.o. M who was seen today for physical therapy evaluation and treatment for chronic neck and UE pain. Physical findings are consistent with MD impression as pt demonstrates postural deficits and increased muscle tone to palpation. NDI score indicates severe  disability in performance of home ADLs and community activities. Pt would benefit from skilled PT services working on improving DNF and periscapular strength and manual therapy for decreasing pain and soft tissue irritation.   OBJECTIVE IMPAIRMENTS: Abnormal gait, decreased activity tolerance, decreased mobility, difficulty walking, decreased ROM, decreased strength, impaired UE functional use, postural dysfunction, and pain  ACTIVITY LIMITATIONS: carrying, lifting, stairs, transfers, and reach over head  PARTICIPATION LIMITATIONS: driving, shopping, community activity, and yard work  PERSONAL FACTORS: Time since onset of injury/illness/exacerbation and 1 comorbidity: HTN  are also affecting patient's functional outcome.   REHAB POTENTIAL: Good  CLINICAL DECISION MAKING: Stable/uncomplicated  EVALUATION COMPLEXITY: Low   GOALS: Goals reviewed with patient? No  SHORT TERM GOALS: Target date: 03/09/2023   Pt will be compliant and knowledgeable with initial HEP for improved comfort and carryover Baseline: initial HEP given  Goal status: INITIAL  2.  Pt will self report neck pain no greater than 6/10 for improved comfort and functional ability Baseline: 10/10 at worst Goal status: INITIAL   LONG TERM GOALS: Target date: 04/13/2023   Pt will decrease NDI disability score to no greater than 50% as proxy for functional improvement Baseline: 70% disability Goal status: INITIAL   2.  Pt will self report neck pain no greater than 3/10 for improved comfort and functional ability Baseline: 10/10 at worst Goal status: INITIAL   3.  Pt will improve DNF endurance test hold time to no less than 15 seconds for improved postural muscle strength and decrease neck pain Baseline: 5 sec Goal status: INITIAL  4.  Pt will improve bilateral middle/lower trap strength to no less than 3+/5 for improved postural strength and decrease neck pain Baseline: 3/5 Goal status: INITIAL   PLAN:  PT  FREQUENCY: 1x/week  PT DURATION: 8 weeks  PLANNED INTERVENTIONS: 97164- PT Re-evaluation, 97110-Therapeutic exercises, 97530- Therapeutic activity, 97112- Neuromuscular re-education, 97535- Self Care, [id number]- Manual therapy, 97014- Electrical stimulation (unattended), [id number]- Electrical stimulation (manual), Dry Needling, Cryotherapy, and Moist heat  PLAN FOR NEXT SESSION: assess HEP response, manual for decreasing neck pain, DNF and periscapular strengthening    Eloy End, PT 02/16/2023, 11:20 AM

## 2023-02-16 ENCOUNTER — Other Ambulatory Visit: Payer: Self-pay

## 2023-02-17 ENCOUNTER — Other Ambulatory Visit: Payer: Self-pay

## 2023-02-21 ENCOUNTER — Encounter: Payer: Self-pay | Admitting: Family Medicine

## 2023-02-22 ENCOUNTER — Ambulatory Visit: Payer: Medicare Other

## 2023-02-22 DIAGNOSIS — M542 Cervicalgia: Secondary | ICD-10-CM | POA: Diagnosis not present

## 2023-02-22 DIAGNOSIS — R293 Abnormal posture: Secondary | ICD-10-CM | POA: Diagnosis not present

## 2023-02-22 DIAGNOSIS — M6281 Muscle weakness (generalized): Secondary | ICD-10-CM

## 2023-02-22 DIAGNOSIS — M5412 Radiculopathy, cervical region: Secondary | ICD-10-CM | POA: Diagnosis not present

## 2023-02-22 NOTE — Therapy (Signed)
OUTPATIENT PHYSICAL THERAPY TREATMENT   Patient Name: Patrick Ortega MRN: [id number] DOB:07-29-1940, 83 y.o., male Today's Date: 02/23/2023  END OF SESSION:  PT End of Session - 02/23/23 0822     Visit Number 2    Number of Visits 9    Date for PT Re-Evaluation 04/13/23    Authorization Type Medicare    PT Start Time 1615    PT Stop Time 1655    PT Time Calculation (min) 40 min    Activity Tolerance Patient tolerated treatment well    Behavior During Therapy [facility] for tasks assessed/performed              Past Medical History:  Diagnosis Date   GERD (gastroesophageal reflux disease)    Hypertension    Past Surgical History:  Procedure Laterality Date   NO PAST SURGERIES     Patient Active Problem List   Diagnosis Date Noted   Hematuria 09/02/2022   Language barrier affecting health care 09/02/2022   Bilateral hearing loss 09/02/2022   Cervical disc disease 05/18/2022   Neck pain 11/09/2021   Bilateral lower extremity edema 11/09/2021   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   Dysuria 05/25/2021   Vitamin D deficiency 07/10/2020   Elevated PSA 12/11/2019   Urinary frequency 12/10/2019   Chronic right shoulder pain 12/10/2019   Essential hypertension 05/24/2014   Osteoarthritis of left hip 05/24/2014    PCP: Grayce Sessions, NP  REFERRING PROVIDER: Hoy Register, MD  REFERRING DIAG: M54.12 (ICD-10-CM) - Cervical radiculopathy   THERAPY DIAG:  Cervicalgia  Muscle weakness (generalized)  Abnormal posture  Rationale for Evaluation and Treatment: Rehabilitation  ONSET DATE: Chronic  SUBJECTIVE:                                                                                                                                                                                                         SUBJECTIVE STATEMENT: Pt presents to PT with reports of feeling slightly dizzy and continued neck pain. Has been compliant with initial HEP with no  adverse effect.   EVAL: Pt presents to PT with granddaughter who helps with interpretation. He notes chronic neck and bilateral upper trap pain that is made worse with prolonged posturing and yard work. Has occasional symptoms of N/T down R UE, denies bowel/bladder changes or saddle anesthesia. He will frequently self massage in order to decrease pain.   Hand dominance: Right  PERTINENT HISTORY:  HTN  PAIN:  Are you having pain?  Yes: NPRS scale: 7/10 Worst: 10/10 Pain location: neck and  bilateral upper traps Pain description: tight, sore, N/T Aggravating factors: prologned sitting, yard work Relieving factors: massage, heat  PRECAUTIONS: None  RED FLAGS: None     WEIGHT BEARING RESTRICTIONS: No  FALLS:  Has patient fallen in last 6 months? No  LIVING ENVIRONMENT: Lives with: lives with their family Lives in: House/apartment  OCCUPATION: retired  PLOF: Independent  PATIENT GOALS: decrease neck pain, improve posture  NEXT MD VISIT: 07/13/2023  OBJECTIVE:  Note: Objective measures were completed at Evaluation unless otherwise noted.  DIAGNOSTIC FINDINGS:  See imaging   PATIENT SURVEYS:  NDI: 35/50  COGNITION: Overall cognitive status: Within functional limits for tasks assessed  SENSATION: WFL  POSTURE: rounded shoulders and forward head  PALPATION: TTP to R upper trapezius    CERVICAL ROM:   Active ROM A/PROM (deg) eval  Flexion   Extension   Right lateral flexion   Left lateral flexion   Right rotation 45  Left rotation 46   (Blank rows = not tested)  UPPER EXTREMITY ROM:  Active ROM Right eval Left eval  Shoulder flexion [facility]  Shoulder extension    Shoulder abduction    Shoulder adduction    Shoulder extension    Shoulder internal rotation    Shoulder external rotation    Elbow flexion    Elbow extension    Wrist flexion    Wrist extension    Wrist ulnar deviation    Wrist radial deviation    Wrist pronation    Wrist  supination     (Blank rows = not tested)  UPPER EXTREMITY MMT:  MMT Right eval Left eval  Shoulder flexion    Shoulder extension    Shoulder abduction    Shoulder adduction    Shoulder extension    Shoulder internal rotation    Shoulder external rotation    Middle trapezius 3/5 3/5  Lower trapezius 3/5 3/5  Elbow flexion    Elbow extension    Wrist flexion    Wrist extension    Wrist ulnar deviation    Wrist radial deviation    Wrist pronation    Wrist supination    Grip strength     (Blank rows = not tested)  CERVICAL SPECIAL TESTS:  Upper limb tension test (ULTT): Negative  FUNCTIONAL TESTS:  DNF Endurance Test: 5 seconds   TODAY'S TREATMENT: [facility] Adult PT Treatment:                                                 BP: 105/85 Therapeutic Exercise: UBE lvl 1.5 x 3 min  Supine horizontal abd 2x10 YTB Supine chin tuck 2x10  Seated bilateral ER YTB 2x10 Standing row 2x10 GTB Standing ext 2x10 GTB Manual Therapy: STM to bilateral upper traps and cervical paraspinals Suboccipital release   PATIENT EDUCATION:  Education details: HEP update Person educated: Patient Education method: Explanation, Facilities manager, and Handouts Education comprehension: verbalized understanding and returned demonstration  HOME EXERCISE PROGRAM: Access Code: [email or web address] URL: [email or web address] Date: 02/23/2023 Prepared by: Edwinna Areola  Exercises - Seated Scapular Retraction  - 1-2 x daily - 7 x weekly - 2 sets - 10 reps - 3 sec hold - cervical extension snag with towel  - 1-2 x daily - 7 x weekly - 2 sets - 10 reps - 5 sec hold - Seated Upper Trapezius Stretch  -  1-2 x daily - 7 x weekly - 2 reps - 30 sec hold - Supine Chin Tuck  - 1 x daily - 7 x weekly - 2 sets - 10 reps - 5 sec hold - Standing Shoulder Row with Anchored Resistance  - 1 x daily - 7 x weekly - 3 sets - 10 reps - verde hold - Shoulder extension with resistance - Neutral  - 1 x daily - 7 x weekly - 3  sets - 10 reps - verde hold  ASSESSMENT:  CLINICAL IMPRESSION: Pt was able to complete all prescribed exercises with no adverse effect. Responded fair to manual therapy interventions noting slight decrease in pain. Exercises focused on improving periscapular strength and DNF endurance in order to decrease neck pain. Will continue to progress as able per POC.   EVAL: Patient is a 83 y.o. M who was seen today for physical therapy evaluation and treatment for chronic neck and UE pain. Physical findings are consistent with MD impression as pt demonstrates postural deficits and increased muscle tone to palpation. NDI score indicates severe disability in performance of home ADLs and community activities. Pt would benefit from skilled PT services working on improving DNF and periscapular strength and manual therapy for decreasing pain and soft tissue irritation.   OBJECTIVE IMPAIRMENTS: Abnormal gait, decreased activity tolerance, decreased mobility, difficulty walking, decreased ROM, decreased strength, impaired UE functional use, postural dysfunction, and pain  ACTIVITY LIMITATIONS: carrying, lifting, stairs, transfers, and reach over head  PARTICIPATION LIMITATIONS: driving, shopping, community activity, and yard work  PERSONAL FACTORS: Time since onset of injury/illness/exacerbation and 1 comorbidity: HTN  are also affecting patient's functional outcome.   REHAB POTENTIAL: Good  CLINICAL DECISION MAKING: Stable/uncomplicated  EVALUATION COMPLEXITY: Low   GOALS: Goals reviewed with patient? No  SHORT TERM GOALS: Target date: 03/09/2023   Pt will be compliant and knowledgeable with initial HEP for improved comfort and carryover Baseline: initial HEP given  Goal status: INITIAL  2.  Pt will self report neck pain no greater than 6/10 for improved comfort and functional ability Baseline: 10/10 at worst Goal status: INITIAL   LONG TERM GOALS: Target date: 04/13/2023   Pt will decrease NDI  disability score to no greater than 50% as proxy for functional improvement Baseline: 70% disability Goal status: INITIAL   2.  Pt will self report neck pain no greater than 3/10 for improved comfort and functional ability Baseline: 10/10 at worst Goal status: INITIAL   3.  Pt will improve DNF endurance test hold time to no less than 15 seconds for improved postural muscle strength and decrease neck pain Baseline: 5 sec Goal status: INITIAL  4.  Pt will improve bilateral middle/lower trap strength to no less than 3+/5 for improved postural strength and decrease neck pain Baseline: 3/5 Goal status: INITIAL   PLAN:  PT FREQUENCY: 1x/week  PT DURATION: 8 weeks  PLANNED INTERVENTIONS: 97164- PT Re-evaluation, 97110-Therapeutic exercises, 97530- Therapeutic activity, 97112- Neuromuscular re-education, 97535- Self Care, [id number]- Manual therapy, 97014- Electrical stimulation (unattended), [id number]- Electrical stimulation (manual), Dry Needling, Cryotherapy, and Moist heat  PLAN FOR NEXT SESSION: assess HEP response, manual for decreasing neck pain, DNF and periscapular strengthening    Eloy End, PT 02/23/2023, 8:23 AM

## 2023-03-01 ENCOUNTER — Ambulatory Visit: Payer: Medicare Other

## 2023-03-01 DIAGNOSIS — M5412 Radiculopathy, cervical region: Secondary | ICD-10-CM | POA: Diagnosis not present

## 2023-03-01 DIAGNOSIS — M542 Cervicalgia: Secondary | ICD-10-CM | POA: Diagnosis not present

## 2023-03-01 DIAGNOSIS — M6281 Muscle weakness (generalized): Secondary | ICD-10-CM

## 2023-03-01 DIAGNOSIS — R293 Abnormal posture: Secondary | ICD-10-CM

## 2023-03-01 NOTE — Therapy (Addendum)
 OUTPATIENT PHYSICAL THERAPY TREATMENT NOTE/DISCHARGE  PHYSICAL THERAPY DISCHARGE SUMMARY  Visits from Start of Care: 3  Current functional level related to goals / functional outcomes: See goals/objective   Remaining deficits: Unable to assess   Education / Equipment: HEP   Patient agrees to discharge. Patient goals were unable to assess. Patient is being discharged due to not returning since the last visit.    Patient Name: Patrick Ortega MRN: [id number] DOB:September 06, 1940, 83 y.o., male Today's Date: 03/01/2023  END OF SESSION:  PT End of Session - 03/01/23 1529     Visit Number 3    Number of Visits 9    Date for PT Re-Evaluation 04/13/23    Authorization Type Medicare    PT Start Time 1530    PT Stop Time 1608    PT Time Calculation (min) 38 min    Activity Tolerance Patient tolerated treatment well    Behavior During Therapy [facility] for tasks assessed/performed               Past Medical History:  Diagnosis Date   GERD (gastroesophageal reflux disease)    Hypertension    Past Surgical History:  Procedure Laterality Date   NO PAST SURGERIES     Patient Active Problem List   Diagnosis Date Noted   Hematuria 09/02/2022   Language barrier affecting health care 09/02/2022   Bilateral hearing loss 09/02/2022   Cervical disc disease 05/18/2022   Neck pain 11/09/2021   Bilateral lower extremity edema 11/09/2021   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   Dysuria 05/25/2021   Vitamin D  deficiency 07/10/2020   Elevated PSA 12/11/2019   Urinary frequency 12/10/2019   Chronic right shoulder pain 12/10/2019   Essential hypertension 05/24/2014   Osteoarthritis of left hip 05/24/2014    PCP: Celestia Rosaline SQUIBB, NP  REFERRING PROVIDER: Delbert Clam, MD  REFERRING DIAG: M54.12 (ICD-10-CM) - Cervical radiculopathy   THERAPY DIAG:  Cervicalgia  Muscle weakness (generalized)  Abnormal posture  Rationale for Evaluation and Treatment:  Rehabilitation  ONSET DATE: Chronic  SUBJECTIVE:                                                                                                                                                                                                         SUBJECTIVE STATEMENT: Pt presents to PT noting that he is feeling very good over the last week with no neck pain. Has continued HEP compliance. He would like to make today his last appointment if possible.   EVAL: Pt presents to PT with granddaughter who  helps with interpretation. He notes chronic neck and bilateral upper trap pain that is made worse with prolonged posturing and yard work. Has occasional symptoms of N/T down R UE, denies bowel/bladder changes or saddle anesthesia. He will frequently self massage in order to decrease pain.   Hand dominance: Right  PERTINENT HISTORY:  HTN  PAIN:  Are you having pain?  Yes: NPRS scale: 0/10 Worst: 10/10 Pain location: neck and bilateral upper traps Pain description: tight, sore, N/T Aggravating factors: prologned sitting, yard work Relieving factors: massage, heat  PRECAUTIONS: None  RED FLAGS: None     WEIGHT BEARING RESTRICTIONS: No  FALLS:  Has patient fallen in last 6 months? No  LIVING ENVIRONMENT: Lives with: lives with their family Lives in: House/apartment  OCCUPATION: retired  PLOF: Independent  PATIENT GOALS: decrease neck pain, improve posture  NEXT MD VISIT: 07/13/2023  OBJECTIVE:  Note: Objective measures were completed at Evaluation unless otherwise noted.  DIAGNOSTIC FINDINGS:  See imaging   PATIENT SURVEYS:  NDI: 35/50  COGNITION: Overall cognitive status: Within functional limits for tasks assessed  SENSATION: WFL  POSTURE: rounded shoulders and forward head  PALPATION: TTP to R upper trapezius    CERVICAL ROM:   Active ROM A/PROM (deg) eval AROM  Flexion    Extension    Right lateral flexion    Left lateral flexion    Right rotation  45 63  Left rotation 46 65   (Blank rows = not tested)  UPPER EXTREMITY ROM:  Active ROM Right eval Left eval  Shoulder flexion [facility]  Shoulder extension    Shoulder abduction    Shoulder adduction    Shoulder extension    Shoulder internal rotation    Shoulder external rotation    Elbow flexion    Elbow extension    Wrist flexion    Wrist extension    Wrist ulnar deviation    Wrist radial deviation    Wrist pronation    Wrist supination     (Blank rows = not tested)  UPPER EXTREMITY MMT:  MMT Right eval Left eval  Shoulder flexion    Shoulder extension    Shoulder abduction    Shoulder adduction    Shoulder extension    Shoulder internal rotation    Shoulder external rotation    Middle trapezius 3/5 3/5  Lower trapezius 3/5 3/5  Elbow flexion    Elbow extension    Wrist flexion    Wrist extension    Wrist ulnar deviation    Wrist radial deviation    Wrist pronation    Wrist supination    Grip strength     (Blank rows = not tested)  CERVICAL SPECIAL TESTS:  Upper limb tension test (ULTT): Negative  FUNCTIONAL TESTS:  DNF Endurance Test: 5 seconds   TODAY'S TREATMENT: [facility] Adult PT Treatment:                                                 Therapeutic Exercise: Supine chin tuck 2x10  Standing row 2x10 blue band Standing ext 2x10 blue band Seated upper trap stretch x 30 each  Manual Therapy: STM to bilateral upper traps and cervical paraspinals Suboccipital release   PATIENT EDUCATION:  Education details: HEP update Person educated: Patient Education method: Explanation, Demonstration, and Handouts Education comprehension: verbalized understanding and returned demonstration  HOME EXERCISE PROGRAM: Access Code: [email or web address] URL: [email or web address] Date: 03/01/2023 Prepared by: Alm Kingdom  Exercises - Supine Chin Tuck  - 1 x daily - 7 x weekly - 2 sets - 10 reps - 5 sec hold - cervical extension snag with towel  - 1-2 x  daily - 7 x weekly - 2 sets - 10 reps - 5 sec hold - Seated Upper Trapezius Stretch  - 1-2 x daily - 7 x weekly - 2 reps - 30 sec hold - Standing Shoulder Row with Anchored Resistance  - 1 x daily - 7 x weekly - 3 sets - 10 reps - verde hold - Shoulder extension with resistance - Neutral  - 1 x daily - 7 x weekly - 3 sets - 10 reps - verde hold  ASSESSMENT:  CLINICAL IMPRESSION: Pt was able to complete prescribed exercises with no adverse effect. Objectively demonstrated increase in cervical rotation ROM bilaterally today. Exercises focused on DNF and periscapular strengthening with pt demonstrating knowledge of performance of HEP. Pt wants to go on hold to see if symptoms continue to be better with just HEP. PT educated pt and granddaughter that he has a 30 day window, otherwise he will need a new referral. Pt verbalized understanding, agreed to current plan.   EVAL: Patient is a 83 y.o. M who was seen today for physical therapy evaluation and treatment for chronic neck and UE pain. Physical findings are consistent with MD impression as pt demonstrates postural deficits and increased muscle tone to palpation. NDI score indicates severe disability in performance of home ADLs and community activities. Pt would benefit from skilled PT services working on improving DNF and periscapular strength and manual therapy for decreasing pain and soft tissue irritation.   OBJECTIVE IMPAIRMENTS: Abnormal gait, decreased activity tolerance, decreased mobility, difficulty walking, decreased ROM, decreased strength, impaired UE functional use, postural dysfunction, and pain  ACTIVITY LIMITATIONS: carrying, lifting, stairs, transfers, and reach over head  PARTICIPATION LIMITATIONS: driving, shopping, community activity, and yard work  PERSONAL FACTORS: Time since onset of injury/illness/exacerbation and 1 comorbidity: HTN are also affecting patient's functional outcome.   REHAB POTENTIAL: Good  CLINICAL DECISION  MAKING: Stable/uncomplicated  EVALUATION COMPLEXITY: Low   GOALS: Goals reviewed with patient? No  SHORT TERM GOALS: Target date: 03/09/2023   Pt will be compliant and knowledgeable with initial HEP for improved comfort and carryover Baseline: initial HEP given  Goal status: MET  2.  Pt will self report neck pain no greater than 6/10 for improved comfort and functional ability Baseline: 10/10 at worst Goal status: MET   LONG TERM GOALS: Target date: 04/13/2023   Pt will decrease NDI disability score to no greater than 50% as proxy for functional improvement Baseline: 70% disability Goal status: IN PROGRESS   2.  Pt will self report neck pain no greater than 3/10 for improved comfort and functional ability Baseline: 10/10 at worst Goal status: MET   3.  Pt will improve DNF endurance test hold time to no less than 15 seconds for improved postural muscle strength and decrease neck pain Baseline: 5 sec Goal status: IN PROGRESS  4.  Pt will improve bilateral middle/lower trap strength to no less than 3+/5 for improved postural strength and decrease neck pain Baseline: 3/5 Goal status: IN PROGRESS   PLAN:  PT FREQUENCY: 1x/week  PT DURATION: 8 weeks  PLANNED INTERVENTIONS: 97164- PT Re-evaluation, 97110-Therapeutic exercises, 97530- Therapeutic activity, 97112- Neuromuscular re-education, 97535- Self Care, [id number]- Manual therapy, 97014- Electrical  stimulation (unattended), [id number]- Electrical stimulation (manual), Dry Needling, Cryotherapy, and Moist heat  PLAN FOR NEXT SESSION: assess HEP response, manual for decreasing neck pain, DNF and periscapular strengthening    Alm JAYSON Kingdom, PT 03/01/2023, 4:26 PM

## 2023-03-08 ENCOUNTER — Encounter: Payer: Medicare Other | Admitting: Physical Therapy

## 2023-03-08 ENCOUNTER — Other Ambulatory Visit (INDEPENDENT_AMBULATORY_CARE_PROVIDER_SITE_OTHER): Payer: Self-pay | Admitting: Primary Care

## 2023-03-08 NOTE — Telephone Encounter (Signed)
 Copied from CRM [id number]. Topic: Clinical - Medication Refill >> Mar 08, 2023  3:32 PM Hector Shade B wrote: Most Recent Primary Care Visit:  Provider: Grayce Sessions  Department: [facility]  Visit Type: OFFICE VISIT  Date: 01/28/2023  Medication:  lidocaine (LIDODERM) 5 %    Has the patient contacted their pharmacy? No (Agent: If no, request that the patient contact the pharmacy for the refill. If patient does not wish to contact the pharmacy document the reason why and proceed with request.) (Agent: If yes, when and what did the pharmacy advise?)  Is this the correct pharmacy for this prescription? Yes If no, delete pharmacy and type the correct one.  This is the patient's preferred pharmacy:  [facility] [address], [address] [place] [address] Phone: [phone] Fax: [phone]   Has the prescription been filled recently? Yes  Is the patient out of the medication? Yes  Has the patient been seen for an appointment in the last year OR does the patient have an upcoming appointment? Yes  Can we respond through MyChart? No  Agent: Please be advised that Rx refills may take up to 3 business days. We ask that you follow-up with your pharmacy.

## 2023-03-09 ENCOUNTER — Other Ambulatory Visit: Payer: Self-pay

## 2023-03-09 MED ORDER — LIDOCAINE 5 % EX PTCH
1.0000 | MEDICATED_PATCH | CUTANEOUS | 1 refills | Status: DC
  Filled 2023-03-09: qty 30, 30d supply, fill #0
  Filled 2023-04-04 (×2): qty 30, 30d supply, fill #1

## 2023-03-09 NOTE — Telephone Encounter (Signed)
 Requested Prescriptions  Pending Prescriptions Disp Refills   lidocaine (LIDODERM) 5 % 30 patch 1    Sig: Place 1 patch onto the skin daily. Remove & Discard patch within 12 hours or as directed by MD     Analgesics:  Topicals Failed - 03/09/2023  1:18 PM      Failed - Manual Review: Labs are only required if the patient has taken medication for more than 8 weeks.      Passed - PLT in normal range and within 360 days    Platelets  Date Value Ref Range Status  01/13/2023 438 150 - 450 x10E3/uL Final         Passed - HGB in normal range and within 360 days    Hemoglobin  Date Value Ref Range Status  01/13/2023 15.6 13.0 - 17.7 g/dL Final   HGB  Date Value Ref Range Status  06/25/2014 14.4 13.0 - 17.1 g/dL Final         Passed - HCT in normal range and within 360 days    HCT  Date Value Ref Range Status  06/25/2014 44.1 38.4 - 49.9 % Final   Hematocrit  Date Value Ref Range Status  01/13/2023 46.9 37.5 - 51.0 % Final         Passed - Cr in normal range and within 360 days    Creat  Date Value Ref Range Status  05/15/2014 0.75 0.50 - 1.35 mg/dL Final   Creatinine, Ser  Date Value Ref Range Status  01/13/2023 1.14 0.76 - 1.27 mg/dL Final   Creatinine, POC  Date Value Ref Range Status  08/04/2016 300 mg/dL Final         Passed - eGFR is 30 or above and within 360 days    GFR, Est African American  Date Value Ref Range Status  05/15/2014 >89 mL/min Final   GFR calc Af Amer  Date Value Ref Range Status  10/12/2019 90 >59 mL/min/1.73 Final    Comment:    **[facility] currently reports eGFR in compliance with the current**   recommendations of the [employer]. [facility] will   update reporting as new guidelines are published from the NKF-ASN   Task force.    GFR, Est Non African American  Date Value Ref Range Status  05/15/2014 >89 mL/min Final    Comment:      The estimated GFR is a calculation valid for adults (>=35 years old) that uses the  CKD-EPI algorithm to adjust for age and sex. It is   not to be used for children, pregnant women, hospitalized patients,    patients on dialysis, or with rapidly changing kidney function. According to the NKDEP, eGFR >89 is normal, 60-89 shows mild impairment, 30-59 shows moderate impairment, 15-29 shows severe impairment and <15 is ESRD.      GFR calc non Af Amer  Date Value Ref Range Status  10/12/2019 78 >59 mL/min/1.73 Final   eGFR  Date Value Ref Range Status  01/13/2023 64 >59 mL/min/1.73 Final         Passed - Patient is not pregnant      Passed - Valid encounter within last 12 months    Recent Outpatient Visits           1 month ago Cervical disc disease   [facility] Grayce Sessions, NP   1 month ago Essential hypertension   [facility] [place]  [facility] Hoy Register, MD   5 months ago Cervical disc disease   [facility] [place]. [facility] Storm Frisk, MD   6 months ago Hematuria, unspecified type   [facility] [place]. [facility] Storm Frisk, MD   9 months ago Cervical disc disease   [facility] [place]. [facility] Storm Frisk, MD       Future Appointments             In 4 months Hoy Register, MD [facility] [place] - [facility] Dept Of [place]igh[facility]. [facility]

## 2023-03-10 ENCOUNTER — Other Ambulatory Visit: Payer: Self-pay

## 2023-03-15 ENCOUNTER — Encounter: Payer: Medicare Other | Admitting: Physical Therapy

## 2023-03-18 ENCOUNTER — Other Ambulatory Visit: Payer: Self-pay

## 2023-04-04 ENCOUNTER — Other Ambulatory Visit: Payer: Self-pay

## 2023-04-05 ENCOUNTER — Other Ambulatory Visit: Payer: Self-pay

## 2023-04-05 ENCOUNTER — Ambulatory Visit: Attending: Nurse Practitioner | Admitting: Nurse Practitioner

## 2023-04-05 ENCOUNTER — Encounter: Payer: Self-pay | Admitting: Nurse Practitioner

## 2023-04-05 VITALS — BP 144/70 | HR 70 | Resp 19 | Ht 61.0 in | Wt 164.8 lb

## 2023-04-05 DIAGNOSIS — M5412 Radiculopathy, cervical region: Secondary | ICD-10-CM | POA: Diagnosis not present

## 2023-04-05 DIAGNOSIS — R21 Rash and other nonspecific skin eruption: Secondary | ICD-10-CM | POA: Diagnosis not present

## 2023-04-05 MED ORDER — LIDOCAINE 5 % EX PTCH
1.0000 | MEDICATED_PATCH | CUTANEOUS | 1 refills | Status: DC
Start: 2023-04-05 — End: 2023-07-05
  Filled 2023-05-19: qty 30, 30d supply, fill #0
  Filled 2023-06-30: qty 30, 30d supply, fill #1

## 2023-04-05 MED ORDER — GABAPENTIN 100 MG PO CAPS
100.0000 mg | ORAL_CAPSULE | Freq: Three times a day (TID) | ORAL | 3 refills | Status: AC
  Filled 2023-04-05: qty 90, 30d supply, fill #0
  Filled 2023-06-20: qty 90, 30d supply, fill #1
  Filled 2023-07-19: qty 90, 30d supply, fill #2

## 2023-04-05 NOTE — Progress Notes (Signed)
 Assessment & Plan:  Patrick Ortega was seen today for rash.  Diagnoses and all orders for this visit:  Cervical radiculopathy -     lidocaine  (LIDODERM ) 5 %; Place 1 patch onto the skin daily. Remove & Discard patch within 12 hours or as directed by MD -     gabapentin  (NEURONTIN ) 100 MG capsule; Take 1 capsule (100 mg total) by mouth 3 (three) times daily. FOR numbness in arms and hands    Patient has been counseled on age-appropriate routine health concerns for screening and prevention. These are reviewed and up-to-date. Referrals have been placed accordingly. Immunizations are up-to-date or declined.    Subjective:   Chief Complaint  Patient presents with   Rash    Tingling all over body.     Patrick Ortega 83 y.o. male presents to office today with c/o of neuropathy/radiculopathy He is a patient of Madelyn Schick ANP-C  VRI was used to communicate directly with patient for the entire encounter including providing detailed patient instructions.    He has chronic cervical radiculopathy. Here today with persistent pain.  He denies any injury or trauma, bowel or urine incontinence. He is being followed by Neurosurgery at this time and I have instructed him to follow up with Dr. Andy Bannister. The pain in his neck radiates into his shoulders and down into the lower back. He is not currently taking any medications for pain. Medications tried in the past include: voltaren , gabapentin , lidoderm  patch, meloxicam , tizandine  and naproxen , tramadol .    Review of Systems  Constitutional:  Negative for fever, malaise/fatigue and weight loss.  HENT: Negative.  Negative for nosebleeds.   Eyes: Negative.  Negative for blurred vision, double vision and photophobia.  Respiratory: Negative.  Negative for cough and shortness of breath.   Cardiovascular: Negative.  Negative for chest pain, palpitations and leg swelling.  Gastrointestinal: Negative.  Negative for heartburn, nausea and vomiting.   Musculoskeletal:  Positive for back pain, joint pain and neck pain. Negative for myalgias.  Neurological: Negative.  Negative for dizziness, focal weakness, seizures and headaches.  Psychiatric/Behavioral: Negative.  Negative for suicidal ideas.     Past Medical History:  Diagnosis Date   GERD (gastroesophageal reflux disease)    Hypertension     Past Surgical History:  Procedure Laterality Date   NO PAST SURGERIES      Family History  Problem Relation Age of Onset   Ulcers Mother     Social History Reviewed with no changes to be made today.   Outpatient Medications Prior to Visit  Medication Sig Dispense Refill   tamsulosin  (FLOMAX ) 0.4 MG CAPS capsule Take 2 capsules (0.8 mg total) by mouth daily. 60 capsule 3   amLODipine  (NORVASC ) 10 MG tablet Take 1 tablet (10 mg total) by mouth daily. 90 tablet 2   valsartan  (DIOVAN ) 160 MG tablet Take 1 tablet (160 mg total) by mouth daily. 90 tablet 3   bacitracin  500 UNIT/GM ointment Apply 1 Application topically 2 (two) times daily. (Patient not taking: Reported on 04/05/2023) 30 g 0   lidocaine  (LIDODERM ) 5 % Place 1 patch onto the skin daily. Remove & Discard patch within 12 hours or as directed by MD (Patient not taking: Reported on 04/05/2023) 30 patch 1   meloxicam  (MOBIC ) 7.5 MG tablet Take 1 tablet (7.5 mg total) by mouth daily. (Patient not taking: Reported on 04/05/2023) 30 tablet 1   No facility-administered medications prior to visit.    No Known Allergies  Objective:    BP (!) 144/70 (BP Location: Left Arm, Patient Position: Sitting, Cuff Size: Normal)   Pulse 70   Resp 19   Ht 5\' 1"  (1.549 m)   Wt 164 lb 12.8 oz (74.8 kg)   SpO2 100%   BMI 31.14 kg/m  Wt Readings from Last 3 Encounters:  04/05/23 164 lb 12.8 oz (74.8 kg)  01/28/23 167 lb 3.2 oz (75.8 kg)  01/13/23 168 lb 6.4 oz (76.4 kg)    Physical Exam Vitals and nursing note reviewed.  Constitutional:      Appearance: He is well-developed.  HENT:      Head: Normocephalic and atraumatic.  Cardiovascular:     Rate and Rhythm: Normal rate and regular rhythm.     Heart sounds: Normal heart sounds. No murmur heard.    No friction rub. No gallop.  Pulmonary:     Effort: Pulmonary effort is normal. No tachypnea or respiratory distress.     Breath sounds: Normal breath sounds. No decreased breath sounds, wheezing, rhonchi or rales.  Chest:     Chest wall: No tenderness.  Abdominal:     General: Bowel sounds are normal.     Palpations: Abdomen is soft.  Musculoskeletal:        General: Normal range of motion.     Cervical back: Normal range of motion.  Skin:    General: Skin is warm and dry.  Neurological:     Mental Status: He is alert and oriented to person, place, and time.     Coordination: Coordination normal.  Psychiatric:        Behavior: Behavior normal. Behavior is cooperative.        Thought Content: Thought content normal.        Judgment: Judgment normal.          Patient has been counseled extensively about nutrition and exercise as well as the importance of adherence with medications and regular follow-up. The patient was given clear instructions to go to ER or return to medical center if symptoms don't improve, worsen or new problems develop. The patient verbalized understanding.   Follow-up: Return in about 3 months (around 07/05/2023) for f/u with PCP .   Collins Dean, FNP-BC [facility] [place], [place] [phone]   05/28/2023, 8:13 PM

## 2023-04-05 NOTE — Patient Instructions (Signed)
 [facility] ph. # [id number] F [phone] [id number]  [address]  [place], [place] [address].

## 2023-04-28 ENCOUNTER — Other Ambulatory Visit: Payer: Self-pay

## 2023-05-02 ENCOUNTER — Ambulatory Visit (INDEPENDENT_AMBULATORY_CARE_PROVIDER_SITE_OTHER): Admitting: Primary Care

## 2023-05-18 ENCOUNTER — Other Ambulatory Visit: Payer: Self-pay

## 2023-05-18 ENCOUNTER — Ambulatory Visit (INDEPENDENT_AMBULATORY_CARE_PROVIDER_SITE_OTHER): Admitting: Primary Care

## 2023-05-18 ENCOUNTER — Encounter (INDEPENDENT_AMBULATORY_CARE_PROVIDER_SITE_OTHER): Payer: Self-pay | Admitting: Primary Care

## 2023-05-18 VITALS — BP 163/80 | HR 67 | Resp 16

## 2023-05-18 DIAGNOSIS — M546 Pain in thoracic spine: Secondary | ICD-10-CM | POA: Diagnosis not present

## 2023-05-18 DIAGNOSIS — G8929 Other chronic pain: Secondary | ICD-10-CM

## 2023-05-18 DIAGNOSIS — I10 Essential (primary) hypertension: Secondary | ICD-10-CM | POA: Diagnosis not present

## 2023-05-18 MED ORDER — AMLODIPINE BESYLATE 5 MG PO TABS
5.0000 mg | ORAL_TABLET | Freq: Every day | ORAL | 1 refills | Status: DC
  Filled 2023-05-18: qty 90, 90d supply, fill #0

## 2023-05-18 NOTE — Patient Instructions (Signed)
 [facility] ph. # [id number] F [phone] [id number]  [address]  [place], [place] [address].

## 2023-05-18 NOTE — Progress Notes (Signed)
 [facility]  Patrick Ortega, is a 83 y.o. male  [id number]  [id number]  DOB - 1940/04/05  Cc: BACK PAIN       Subjective:   Patrick Ortega is a 83 y.o. male here today for an acute visit. Back pain  HPI  No problems updated.  Comprehensive ROS Pertinent positive and negative noted in HPI   No Known Allergies  Past Medical History:  Diagnosis Date   GERD (gastroesophageal reflux disease)    Hypertension     Current Outpatient Medications on File Prior to Visit  Medication Sig Dispense Refill   gabapentin  (NEURONTIN ) 100 MG capsule Take 1 capsule (100 mg total) by mouth 3 (three) times daily. FOR numbness in arms and hands 90 capsule 3   lidocaine  (LIDODERM ) 5 % Place 1 patch onto the skin daily. Remove & Discard patch within 12 hours or as directed by MD 30 patch 1   tamsulosin  (FLOMAX ) 0.4 MG CAPS capsule Take 2 capsules (0.8 mg total) by mouth daily. 60 capsule 3   No current facility-administered medications on file prior to visit.   Health Maintenance  Topic Date Due   COVID-19 Vaccine (4 - 2024-25 season) 09/05/2022   Medicare Annual Wellness Visit  04/22/2023   Flu Shot  08/05/2023   DTaP/Tdap/Td vaccine (2 - Td or Tdap) 12/09/2029   Pneumonia Vaccine  Completed   HPV Vaccine  Aged Out   Meningitis B Vaccine  Aged Out   Zoster (Shingles) Vaccine  Discontinued    Objective:  BP (!) 163/80 (BP Location: Left Arm, Patient Position: Sitting, Cuff Size: Normal)   Pulse 67   Resp 16   SpO2 97%   Vitals:   05/18/23 1609 05/18/23 1611  BP: (!) 162/81 (!) 163/80  Pulse: 67   Resp: 16   SpO2: 97%    BP Readings from Last 3 Encounters:  05/18/23 (!) 163/80  04/05/23 (!) 144/70  01/28/23 (!) 148/72      Physical Exam Vitals reviewed.  Constitutional:      Appearance: He is obese.  HENT:     Head: Normocephalic.     Right Ear: Tympanic membrane and external ear normal.     Left Ear: Tympanic membrane and external ear normal.      Nose: Nose normal.  Eyes:     Extraocular Movements: Extraocular movements intact.     Pupils: Pupils are equal, round, and reactive to light.  Cardiovascular:     Rate and Rhythm: Normal rate and regular rhythm.  Pulmonary:     Effort: Pulmonary effort is normal.     Breath sounds: Normal breath sounds.  Abdominal:     General: Bowel sounds are normal. There is distension.     Palpations: Abdomen is soft.  Musculoskeletal:        General: Normal range of motion.  Skin:    General: Skin is warm and dry.  Neurological:     Mental Status: He is oriented to person, place, and time.  Psychiatric:        Mood and Affect: Mood normal.        Behavior: Behavior normal.        Thought Content: Thought content normal.        Judgment: Judgment normal.     Assessment & Plan  Blaiden was seen today for back pain.  Diagnoses and all orders for this visit:  Essential hypertension BP goal - < 140/90 Explained that having normal blood pressure is  the goal and medications are helping to get to goal and maintain normal blood pressure. DIET: Limit salt intake, read nutrition labels to check salt content, limit fried and high fatty foods  Avoid using multisymptom OTC cold preparations that generally contain sudafed which can rise BP. Consult with pharmacist on best cold relief products to use for persons with HTN EXERCISE Discussed incorporating exercise such as walking - 30 minutes most days of the week and can do in 10 minute intervals     Chronic bilateral thoracic back pain  [facility]  Gabapentin  1 capsule (100 mg total) by mouth 3 (three) times daily.   Other orders -     amLODipine  (NORVASC ) 5 MG tablet; Take 1 tablet (5 mg total) by mouth daily.  Patient have been counseled extensively about nutrition and exercise. Other issues discussed during this visit include: low cholesterol diet, weight control and daily exercise, foot care, annual eye examinations at  Ophthalmology, importance of adherence with medications and regular follow-up. We also discussed long term complications of uncontrolled diabetes and hypertension.   Return in about 6 weeks (around 06/29/2023) for back pain.  The patient was given clear instructions to go to ER or return to medical center if symptoms don't improve, worsen or new problems develop. The patient verbalized understanding. The patient was told to call to get lab results if they haven't heard anything in the next week.   This note has been created with Education officer, environmental. Any transcriptional errors are unintentional.   Marius Siemens, NP 05/23/2023, 3:10 PM

## 2023-05-19 ENCOUNTER — Other Ambulatory Visit: Payer: Self-pay

## 2023-05-23 ENCOUNTER — Other Ambulatory Visit: Payer: Self-pay

## 2023-06-20 ENCOUNTER — Other Ambulatory Visit: Payer: Self-pay

## 2023-06-30 ENCOUNTER — Other Ambulatory Visit: Payer: Self-pay

## 2023-06-30 ENCOUNTER — Inpatient Hospital Stay (INDEPENDENT_AMBULATORY_CARE_PROVIDER_SITE_OTHER): Admitting: Primary Care

## 2023-07-05 ENCOUNTER — Ambulatory Visit (INDEPENDENT_AMBULATORY_CARE_PROVIDER_SITE_OTHER): Admitting: Primary Care

## 2023-07-05 ENCOUNTER — Encounter (INDEPENDENT_AMBULATORY_CARE_PROVIDER_SITE_OTHER): Payer: Self-pay | Admitting: Primary Care

## 2023-07-05 ENCOUNTER — Other Ambulatory Visit: Payer: Self-pay

## 2023-07-05 VITALS — BP 163/76 | HR 87 | Resp 16 | Wt 166.8 lb

## 2023-07-05 DIAGNOSIS — S46812A Strain of other muscles, fascia and tendons at shoulder and upper arm level, left arm, initial encounter: Secondary | ICD-10-CM | POA: Diagnosis not present

## 2023-07-05 DIAGNOSIS — Z7689 Persons encountering health services in other specified circumstances: Secondary | ICD-10-CM

## 2023-07-05 DIAGNOSIS — M5090 Cervical disc disorder, unspecified, unspecified cervical region: Secondary | ICD-10-CM | POA: Diagnosis not present

## 2023-07-05 DIAGNOSIS — I10 Essential (primary) hypertension: Secondary | ICD-10-CM | POA: Diagnosis not present

## 2023-07-05 DIAGNOSIS — R1312 Dysphagia, oropharyngeal phase: Secondary | ICD-10-CM | POA: Diagnosis not present

## 2023-07-05 MED ORDER — LIDOCAINE 10 % EX CREA
10.0000 mg | TOPICAL_CREAM | Freq: Three times a day (TID) | CUTANEOUS | 1 refills | Status: DC | PRN
  Filled 2023-07-05: qty 30, fill #0

## 2023-07-05 NOTE — Progress Notes (Signed)
 [facility]  Patrick Ortega, is a 83 y.o. male  [id number]  [id number]  DOB - 01-24-1940  Pain       Subjective:   Patrick Ortega is a 84 y.o. Hispanic male  (interpreter Garnette [phone])  here today for  pain. a follow up visit. Abnormal gait, Patient has No headache, No chest pain, No abdominal pain - No Nausea, No new weakness tingling or numbness, No Cough - shortness of breath Cervical pain  No problems updated.  Comprehensive ROS Pertinent positive and negative noted in HPI   No Known Allergies  Past Medical History:  Diagnosis Date   GERD (gastroesophageal reflux disease)    Hypertension     Current Outpatient Medications on File Prior to Visit  Medication Sig Dispense Refill   gabapentin  (NEURONTIN ) 100 MG capsule Take 1 capsule (100 mg total) by mouth 3 (three) times daily. FOR numbness in arms and hands 90 capsule 3   tamsulosin  (FLOMAX ) 0.4 MG CAPS capsule Take 2 capsules (0.8 mg total) by mouth daily. 60 capsule 3   No current facility-administered medications on file prior to visit.   Health Maintenance  Topic Date Due   COVID-19 Vaccine (4 - 2024-25 season) 09/05/2022   Medicare Annual Wellness Visit  04/22/2023   Flu Shot  08/05/2023   DTaP/Tdap/Td vaccine (2 - Td or Tdap) 12/09/2029   Pneumococcal Vaccine for age over 42  Completed   Hepatitis B Vaccine  Aged Out   HPV Vaccine  Aged Out   Meningitis B Vaccine  Aged Out   Zoster (Shingles) Vaccine  Discontinued    Objective:  BP (!) 163/76   Pulse 87   Resp 16   Wt 166 lb 12.8 oz (75.7 kg)   SpO2 96%   BMI 31.52 kg/m    BP Readings from Last 3 Encounters:  07/05/23 (!) 163/76  05/18/23 (!) 163/80  04/05/23 (!) 144/70      Physical Exam Vitals reviewed.  Constitutional:      Appearance: He is obese.  HENT:     Head: Normocephalic.     Right Ear: Tympanic membrane and external ear normal.     Left Ear: Tympanic membrane and external ear normal.     Nose: Nose  normal.  Eyes:     Extraocular Movements: Extraocular movements intact.  Cardiovascular:     Rate and Rhythm: Normal rate and regular rhythm.  Pulmonary:     Effort: Pulmonary effort is normal.     Breath sounds: Normal breath sounds.  Abdominal:     General: Bowel sounds are normal.     Palpations: Abdomen is soft.  Musculoskeletal:        General: Normal range of motion.  Skin:    General: Skin is warm and dry.  Neurological:     Mental Status: He is oriented to person, place, and time.  Psychiatric:        Mood and Affect: Mood normal.        Behavior: Behavior normal.       Assessment & Plan  Patrick Ortega was seen today for hypertension.  Diagnoses and all orders for this visit:  Essential hypertension  BP goal - < 140/90 Explained that having normal blood pressure is the goal and medications are helping to get to goal and maintain normal blood pressure. DIET: Limit salt intake, read nutrition labels to check salt content, limit fried and high fatty foods  Avoid using multisymptom OTC cold preparations that generally contain  sudafed which can rise BP. Consult with pharmacist on best cold relief products to use for persons with HTN EXERCISE Discussed incorporating exercise such as walking - 30 minutes most days of the week and can do in 10 minute intervals    Patient instructed to take 2 5 mg amlodipine  in the morning.  Return for blood pressure recheck new prescription sent in for 10 mg after follow-up visit.  Grandson who accompanied him was also given same instructions  Strain of left trapezius muscle, initial encounter ( cutting the grass) -     Ambulatory referral to Physical Therapy -     Lidocaine  10 % CREA; Apply 10 mg topically 3 (three) times daily as needed.  Cervical disc disease -     Ambulatory referral to Physical Therapy -     Lidocaine  10 % CREA; Apply 10 mg topically 3 (three) times daily as needed.  Oropharyngeal dysphagia -     SLP clinical swallow  evaluation; Future  Poor dentition requiring referral to dentistry -     Ambulatory referral to Dentistry     Patient have been counseled extensively about nutrition and exercise. Other issues discussed during this visit include: low cholesterol diet, weight control and daily exercise, foot care, annual eye examinations at Ophthalmology, importance of adherence with medications and regular follow-up. We also discussed long term complications of uncontrolled diabetes and hypertension.   Return in about 4 weeks (around 08/02/2023).  The patient was given clear instructions to go to ER or return to medical center if symptoms don't improve, worsen or new problems develop. The patient verbalized understanding. The patient was told to call to get lab results if they haven't heard anything in the next week.   This note has been created with Education officer, environmental. Any transcriptional errors are unintentional.   Rosaline SHAUNNA Bohr, NP 07/12/2023, 10:25 PM

## 2023-07-06 ENCOUNTER — Other Ambulatory Visit: Payer: Self-pay

## 2023-07-12 MED ORDER — AMLODIPINE BESYLATE 10 MG PO TABS
10.0000 mg | ORAL_TABLET | Freq: Every day | ORAL | 1 refills | Status: DC
  Filled 2023-07-12: qty 90, 90d supply, fill #0
  Filled 2023-11-21: qty 90, 90d supply, fill #1

## 2023-07-13 ENCOUNTER — Other Ambulatory Visit: Payer: Self-pay

## 2023-07-13 ENCOUNTER — Ambulatory Visit: Payer: Medicare Other | Admitting: Family Medicine

## 2023-07-19 ENCOUNTER — Other Ambulatory Visit: Payer: Self-pay

## 2023-07-26 ENCOUNTER — Other Ambulatory Visit: Payer: Self-pay

## 2023-07-29 ENCOUNTER — Telehealth (INDEPENDENT_AMBULATORY_CARE_PROVIDER_SITE_OTHER): Payer: Self-pay

## 2023-07-29 ENCOUNTER — Telehealth (INDEPENDENT_AMBULATORY_CARE_PROVIDER_SITE_OTHER): Payer: Self-pay | Admitting: Primary Care

## 2023-07-29 ENCOUNTER — Encounter (INDEPENDENT_AMBULATORY_CARE_PROVIDER_SITE_OTHER)

## 2023-07-29 NOTE — Telephone Encounter (Signed)
 Called patient two to start appointment ( once at [phone]) patient will need to reschedule appointment and voicemail was left    Interpreter id # [phone]

## 2023-07-29 NOTE — Telephone Encounter (Signed)
 Called pt to get them ready for AW phone call. Pt did not answer and LVM. Please advise

## 2023-07-29 NOTE — Telephone Encounter (Addendum)
 Called patient and left voicemail, to see if we can do his AWV early    Interpreter id # Levorn [phone]

## 2023-08-04 ENCOUNTER — Other Ambulatory Visit: Payer: Self-pay

## 2023-08-04 ENCOUNTER — Encounter (HOSPITAL_COMMUNITY): Payer: Self-pay

## 2023-08-04 ENCOUNTER — Ambulatory Visit (HOSPITAL_COMMUNITY)
Admission: EM | Admit: 2023-08-04 | Discharge: 2023-08-04 | Disposition: A | Discharge status: Home / Self Care | Attending: Emergency Medicine | Admitting: Emergency Medicine

## 2023-08-04 ENCOUNTER — Ambulatory Visit (INDEPENDENT_AMBULATORY_CARE_PROVIDER_SITE_OTHER)

## 2023-08-04 DIAGNOSIS — M19011 Primary osteoarthritis, right shoulder: Secondary | ICD-10-CM | POA: Diagnosis not present

## 2023-08-04 DIAGNOSIS — G8929 Other chronic pain: Secondary | ICD-10-CM

## 2023-08-04 DIAGNOSIS — M549 Dorsalgia, unspecified: Secondary | ICD-10-CM

## 2023-08-04 DIAGNOSIS — M5489 Other dorsalgia: Secondary | ICD-10-CM

## 2023-08-04 DIAGNOSIS — M542 Cervicalgia: Secondary | ICD-10-CM | POA: Diagnosis not present

## 2023-08-04 MED ORDER — BACLOFEN 5 MG PO TABS
5.0000 mg | ORAL_TABLET | Freq: Every evening | ORAL | 0 refills | Status: DC
Start: 2023-08-04 — End: 2023-09-07
  Filled 2023-08-04: qty 20, 20d supply, fill #0

## 2023-08-04 MED ORDER — KETOROLAC TROMETHAMINE 30 MG/ML IJ SOLN
15.0000 mg | Freq: Once | INTRAMUSCULAR | Status: AC
  Administered 2023-08-04: 15 mg via INTRAMUSCULAR

## 2023-08-04 MED ORDER — KETOROLAC TROMETHAMINE 30 MG/ML IJ SOLN
INTRAMUSCULAR | Status: AC
  Filled 2023-08-04: qty 1

## 2023-08-04 NOTE — Discharge Instructions (Addendum)
 I am very glad the pain medicine given today has helped you.  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  This is something you will need to continue following along with your primary care provider for.  Please call them first thing tomorrow morning to make a follow-up appointment.  I have sent baclofen  which is a muscle relaxer.  Take 1 tablet at bedtime.  Try this for a few days.  Take caution if this medicine makes you drowsy.  Please go to the emergency department if symptoms worsen.

## 2023-08-04 NOTE — ED Provider Notes (Signed)
 [facility]    CSN: [id number] Arrival date & time: 08/04/23  1242      History   Chief Complaint Chief Complaint  Patient presents with   Back Pain    HPI Patrick Ortega is a 83 y.o. male.  Granddaughter translates per patient request  2 day history of worsening neck and back pain. Mostly the right upper back. He is rating 10/10 currently.  I have asked him to clarify if this is the worst pain of his life and he says yes.  However his granddaughter states he may not fully understand what that means.  She thinks it is not that severe since he can still walk.  Sometimes has pins-and-needles feeling in the bilateral hands.  He does not have this symptom currently He has no weakness Denies any headache, dizziness, vision changes, fever, neck stiffness, head or neck injury  He has had neck and upper back pain for a while.  Just seems that pain is worsened more recently. PMH of cervical radiculopathy, cervical stenosis  His PCP has referred him to physical therapy  He is taking a medication but does not know the name. 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 Authorizing Provider  Baclofen  5 MG TABS Take 1 tablet (5 mg total) by mouth at bedtime. 08/04/23  Yes Bralyn Folkert, Asberry, PA-C  amLODipine  (NORVASC ) 10 MG tablet Take 1 tablet (10 mg total) by mouth daily. 07/12/23   Celestia Rosaline SQUIBB, NP  gabapentin  (NEURONTIN ) 100 MG capsule Take 1 capsule (100 mg total) by mouth 3 (three) times daily. FOR numbness in arms and hands 04/05/23   Theotis Haze ORN, NP  Lidocaine  10 % CREA Apply 10 mg topically 3 (three) times daily as needed. 07/05/23   Celestia Rosaline SQUIBB, NP  tamsulosin  (FLOMAX ) 0.4 MG CAPS capsule Take 2 capsules (0.8 mg total) by mouth daily. 02/15/23   Celestia Rosaline SQUIBB, NP    Family History Family History  Problem Relation Age of Onset   Ulcers Mother     Social History Social History   Tobacco Use   Smoking status: Former    Current packs/day: 0.25    Average packs/day: 0.3 packs/day for 0.5 years (0.1 ttl pk-yrs)    Types: Cigarettes   Smokeless tobacco: Never  Vaping Use   Vaping status: Never Used  Substance Use Topics   Alcohol use: Not Currently     Allergies   Patient has no known allergies.   Review of Systems Review of Systems  Musculoskeletal:  Positive for back pain.   As per HPI  Physical Exam Triage Vital Signs  ED Triage Vitals  Encounter Vitals Group     BP 08/04/23 1347 (!) 170/81     Girls Systolic BP Percentile --      Girls Diastolic BP Percentile --      Boys Systolic BP Percentile --      Boys Diastolic BP Percentile --      Pulse Rate 08/04/23 1347 84     Resp 08/04/23 1347 16     Temp 08/04/23 1347 (!) 97 F (36.1 C)     Temp Source 08/04/23 1347 Oral     SpO2 08/04/23 1347 94 %     Weight --      Height --      Head Circumference --      Peak Flow --      Pain Score 08/04/23 1352 10     Pain Loc --      Pain Education --      Exclude from Growth Chart --    No data found.  Updated Vital Signs BP (!) 176/89 (BP Location: Right Arm)   Pulse 84   Temp (!) 97 F (36.1 C)  (Oral)   Resp 16   SpO2 94%   Physical Exam Vitals and nursing note reviewed.  Constitutional:      General: He is not in acute distress. HENT:     Mouth/Throat:     Mouth: Mucous membranes are moist.     Pharynx: Oropharynx is clear.  Eyes:     Extraocular Movements: Extraocular movements intact.     Conjunctiva/sclera: Conjunctivae normal.     Pupils: Pupils are equal, round, and reactive to light.  Cardiovascular:     Rate and Rhythm: Normal rate and regular rhythm.     Pulses:          Radial pulses are 2+ on the right side and 2+ on the left side.     Heart sounds: Normal heart sounds.     Comments: Strong equal pulses Pulmonary:     Effort: Pulmonary effort is normal.     Breath sounds: Normal breath sounds.  Musculoskeletal:        General: Normal range of motion.     Cervical back: Normal range of motion. Tenderness present. No rigidity.     Thoracic back: Tenderness present.       Back:     Comments: Muscular tenderness Right upper back. No bony tenderness C-L spine. Full ROM of neck and upper extremities without pain. Muscular tenderness of bilateral neck   Skin:    General: Skin is warm and dry.     Findings: No erythema, lesion or rash.  Neurological:     General: No focal deficit present.     Mental Status: He is alert and oriented to person, place, and time.     Cranial Nerves: Cranial nerves 2-12 are intact. No cranial nerve deficit.     Sensory: Sensation is intact.     Motor: Motor function is intact. No weakness.     Coordination: Coordination is intact.     Gait: Gait is intact.     Deep Tendon Reflexes: Reflexes are normal and symmetric.     Comments: Strength 5/5. Sensation intact throughout      UC Treatments / Results  Labs (all labs ordered are listed, but only abnormal results are displayed) Labs Reviewed - No data to display  EKG  Radiology DG Scapula Right Result Date: 08/04/2023 CLINICAL DATA:  Upper back and neck  pain for 2 days  EXAM: RIGHT SCAPULA - 2+ VIEWS COMPARISON:  12/14/2019 FINDINGS: Frontal and trans scapular projections of the right scapula demonstrate no findings of avulsion or fracture. Degenerative spurring at the [facility] joint. Mid and lower thoracic spondylosis. IMPRESSION: 1. No acute findings. 2. Degenerative spurring at the [facility] joint. 3. Mid and lower thoracic spondylosis. Electronically Signed   By: Ryan Salvage M.D.   On: 08/04/2023 14:51    Procedures Procedures  Medications Ordered in UC Medications  ketorolac  (TORADOL ) 30 MG/ML injection 15 mg (15 mg Intramuscular Given 08/04/23 1440)    Initial Impression / Assessment and Plan / UC Course  I have reviewed the triage vital signs and the nursing notes.  Pertinent labs & imaging results that were available during my care of the patient were reviewed by me and considered in my medical decision making (see chart for details).  BP 170/81 in clinic He is taking amlodipine  2.5 mg daily. Was supposed to follow up with PCP but missed the appointment.   Neurologically intact.  Prior CMP reviewed, WNL. No history of kidney issues. Half dose 15 mg toradol  given. On reassessment patient is much improved. He rates pain now 3/10.  Right scapular xray with degenerative spurring AC joint, thoracic spondylosis.  No acute findings. Images independently reviewed by me, agree with radiology interpretation. Flare up of his chronic pain  Had PCP visit scheduled last week but missed it Advised needs to call to reschedule. Granddaughter will help him as she speaks english.  Try baclofen  5 mg nightly for a few nights. Has referral to PT already, recommend this for further management.  Return and ED precautions   Final Clinical Impressions(s) / UC Diagnoses   Final diagnoses:  Upper back pain on right side  Other chronic back pain     Discharge Instructions      I am very glad the pain medicine given today has helped you.  Your x-ray does show some  degenerative changes of the neck and shoulder.  This is something you will need to continue following along with your primary care provider for.  Please call them first thing tomorrow morning to make a follow-up appointment.  I have sent baclofen  which is a muscle relaxer.  Take 1 tablet at bedtime.  Try this for a few days.  Take caution if this medicine makes you drowsy.  Please go to the emergency department if symptoms worsen.     ED Prescriptions     Medication Sig Dispense Auth. Provider   Baclofen  5 MG TABS Take 1 tablet (5 mg total) by mouth at bedtime. 20 tablet Kaytlynne Neace, Asberry, PA-C      PDMP not reviewed this encounter.   Jeryl Asberry, PA-C 08/04/23 1626

## 2023-08-04 NOTE — ED Triage Notes (Signed)
 Patient here today with c/o upper back pain and neck pain X 2 days.

## 2023-08-05 ENCOUNTER — Other Ambulatory Visit: Payer: Self-pay

## 2023-08-11 ENCOUNTER — Ambulatory Visit: Admitting: Cardiovascular Disease

## 2023-09-07 ENCOUNTER — Encounter (HOSPITAL_COMMUNITY): Payer: Self-pay

## 2023-09-07 ENCOUNTER — Other Ambulatory Visit: Payer: Self-pay | Admitting: Primary Care

## 2023-09-07 ENCOUNTER — Ambulatory Visit (HOSPITAL_COMMUNITY)
Admission: EM | Admit: 2023-09-07 | Discharge: 2023-09-07 | Disposition: A | Discharge status: Home / Self Care | Attending: Internal Medicine | Admitting: Internal Medicine

## 2023-09-07 ENCOUNTER — Other Ambulatory Visit: Payer: Self-pay

## 2023-09-07 DIAGNOSIS — M542 Cervicalgia: Secondary | ICD-10-CM | POA: Diagnosis not present

## 2023-09-07 DIAGNOSIS — G8929 Other chronic pain: Secondary | ICD-10-CM | POA: Diagnosis not present

## 2023-09-07 DIAGNOSIS — J029 Acute pharyngitis, unspecified: Secondary | ICD-10-CM | POA: Diagnosis not present

## 2023-09-07 DIAGNOSIS — M5412 Radiculopathy, cervical region: Secondary | ICD-10-CM

## 2023-09-07 MED ORDER — PREDNISONE 20 MG PO TABS
40.0000 mg | ORAL_TABLET | Freq: Every day | ORAL | 0 refills | Status: AC
  Filled 2023-09-07: qty 10, 5d supply, fill #0

## 2023-09-07 MED ORDER — BACLOFEN 5 MG PO TABS
5.0000 mg | ORAL_TABLET | Freq: Every evening | ORAL | 0 refills | Status: DC | PRN
  Filled 2023-09-07: qty 20, 20d supply, fill #0

## 2023-09-07 NOTE — ED Triage Notes (Signed)
 Pt present with upper back pain and neck pain x two months. States he was seen previously for the same issue and was given an injection for relief. States the pain went away and has now come back. He has used lidocaine  patches that have not given relief. Pt states he also has a sore throat that has been going on for a while.

## 2023-09-07 NOTE — Discharge Instructions (Signed)
 Su dolor se debe a una inflamacin en el cuello causada por un espasmo muscular.  Tome las pastillas de prednisona esteroide segn lo recetado a Glass blower/designer de hoy. 2 pastillas de 40 mg una vez al da cada maana con las comidas/desayuno durante 5 [place]. No tome ibuprofeno, naproxeno ni otros AINE mientras est tomando pastillas de esteroides, ya que pueden causar [employer].  Tome 5 mg de baclofeno antes de acostarse, segn sea necesario, para el espasmo muscular.  Aplique calor en la parte superior de la espalda y el cuello para Engineer, materials.  Consulte con su mdico de cabecera para una evaluacin continua del manejo de su dolor de cuello crnico.  Your pain is due to inflammation in your neck due to muscle spasm.   Take prednisone  steroid pills as prescribed starting today.  2 pills- 40mg - once daily each morning with food/breakfast for 5 days Do not take ibuprofen/naproxen /other NSAIDs while taking steroid pills as this can cause upset stomach.    Take baclofen  5 mg at bedtime as needed for muscle spasm.  Please use heat to the upper back and neck to alleviate pain.  Follow-up with your primary care provider for ongoing evaluation management of your chronic neck pain.

## 2023-09-07 NOTE — ED Provider Notes (Addendum)
 [facility]    CSN: [id number] Arrival date & time: 09/07/23  1404      History   Chief Complaint No chief complaint on file.   HPI Patrick Ortega is a 83 y.o. male.   Patrick Ortega is a 83 y.o. male presenting for chief complaint of bilateral neck pain that is chronic in nature. He had an acute flare of chronic bilateral neck pain 6 weeks ago where he was seen in urgent care and provided muscle relaxer/ketorolac  injection with significant improvement in pain. He is back today with another acute flareup of bilateral neck pain.  Pain starts to the bilateral cervical paraspinous muscles and radiates into the bilateral shoulders.  Pain sometimes radiates down into the right forearm.  Denies recent trauma/injuries to the neck, weakness to the arms, or open wounds/rashes to the overlying skin.   Denies low back pain, saddle anesthesia, radicular pain to the T or L-spine, dizziness, headaches, and fever/chills.  He has normal range of motion of the neck.  He has been seen by his primary care provider for similar symptoms in the past and has a well-documented history of trapezius strain. He has been seen by physical therapy for neck pain without much relief. Patient has not attempted treatment of back pain at home.  Heat and rest makes pain a little better.  Additionally reports sore throat that started 1 month ago.  Sore throat happens when he swallows.  Denies rhinorrhea, watery/itchy eyes, fever, chills, cough, nausea, vomiting, diarrhea, and abdominal pain.  No recent sick contacts with similar symptoms reported.  Denies difficulty maintaining secretions/changes in voice sounds.  History of GERD, denies acid reflux contributing to sore throat.  He has not attempted treatment of symptoms at home.  The history is provided by the patient. The history is limited by a language barrier. A language interpreter was used (Spanish medical interpreter used for entirety of patient  encounter).    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 Authorizing Provider  Baclofen  5 MG TABS Take 1 tablet (5 mg total) by mouth at bedtime as needed. 09/07/23  Yes Enedelia Dorna HERO, FNP  predniSONE  (DELTASONE ) 20 MG tablet Take 2 tablets (40 mg total) by mouth daily with breakfast for 5 days. 09/07/23 09/12/23 Yes StanhopeDorna HERO, FNP  amLODipine  (NORVASC ) 10 MG tablet Take 1 tablet (10 mg total) by mouth daily. 07/12/23   Celestia Rosaline SQUIBB, NP  gabapentin  (NEURONTIN ) 100 MG capsule Take 1 capsule (100 mg total) by mouth 3 (three) times daily. FOR numbness in arms and hands 04/05/23   Theotis Haze ORN, NP  Lidocaine  10 % CREA Apply 10  mg topically 3 (three) times daily as needed. 07/05/23   Celestia Rosaline SQUIBB, NP  tamsulosin  (FLOMAX ) 0.4 MG CAPS capsule Take 2 capsules (0.8 mg total) by mouth daily. 02/15/23   Celestia Rosaline SQUIBB, NP    Family History Family History  Problem Relation Age of Onset   Ulcers Mother     Social History Social History   Tobacco Use   Smoking status: Former    Current packs/day: 0.25    Average packs/day: 0.3 packs/day for 0.5 years (0.1 ttl pk-yrs)    Types:  Cigarettes   Smokeless tobacco: Never  Vaping Use   Vaping status: Never Used  Substance Use Topics   Alcohol use: Not Currently     Allergies   Patient has no known allergies.   Review of Systems Review of Systems Per HPI  Physical Exam Triage Vital Signs ED Triage Vitals  Encounter Vitals Group     BP 09/07/23 1518 (!) 180/80     Girls Systolic BP Percentile --      Girls Diastolic BP Percentile --      Boys Systolic BP Percentile --      Boys Diastolic BP Percentile --      Pulse Rate 09/07/23 1518 72     Resp 09/07/23 1518 16     Temp 09/07/23 1518 97.8 F (36.6 C)     Temp Source 09/07/23 1518 Oral     SpO2 09/07/23 1518 95 %     Weight --      Height --      Head Circumference --      Peak Flow --      Pain Score 09/07/23 1517 7     Pain Loc --      Pain Education --      Exclude from Growth Chart --    No data found.  Updated Vital Signs BP (!) 180/80 (BP Location: Right Arm)   Pulse 72   Temp 97.8 F (36.6 C) (Oral)   Resp 16   SpO2 95%   Visual Acuity Right Eye Distance:   Left Eye Distance:   Bilateral Distance:    Right Eye Near:   Left Eye Near:    Bilateral Near:     Physical Exam Vitals and nursing note reviewed.  Constitutional:      Appearance: He is not ill-appearing or toxic-appearing.  HENT:     Head: Normocephalic and atraumatic.     Right Ear: Hearing, tympanic membrane, ear canal and external ear normal.     Left Ear: Hearing, tympanic membrane, ear canal and external ear normal.     Nose: Nose normal.     Mouth/Throat:     Lips: Pink.     Mouth: Mucous membranes are moist. No injury or oral lesions.     Dentition: Normal dentition.     Tongue: No lesions.     Pharynx: Oropharynx is clear. Uvula midline. No pharyngeal swelling, oropharyngeal exudate, posterior oropharyngeal erythema, uvula swelling or postnasal drip.     Tonsils: No tonsillar exudate.     Comments: No appreciable erythema/tonsillar swelling on exam.   Maintaining secretions without difficulty.  Normal voice sounds. Eyes:     General: Lids are normal. Vision grossly intact. Gaze aligned appropriately.     Extraocular Movements: Extraocular movements intact.     Conjunctiva/sclera: Conjunctivae normal.  Neck:     Trachea: Trachea and phonation normal.  Cardiovascular:     Rate and Rhythm: Normal rate  and regular rhythm.     Pulses: Normal pulses.     Heart sounds: Normal heart sounds, S1 normal and S2 normal.     Comments: +2 bilateral radial pulses.  Less than 2 cap refill to bilateral upper extremities. Pulmonary:     Effort: Pulmonary effort is normal. No respiratory distress.     Breath sounds: Normal breath sounds and air entry.  Musculoskeletal:     Cervical back: Normal range of motion and neck supple. No edema, erythema, signs of trauma, rigidity, torticollis or crepitus. Pain with movement (Tenderness to the bilateral trapezius musculature with lateral flexion of the neck) and muscular tenderness (Tender to multiple points of palpation over the bilateral trapezius musculature and the cervical paraspinous muscles innervating into the bilateral shoulders.) present. No spinous process tenderness. Normal range of motion (Normal range of motion of the neck lateral and forward flexion/extension of the neck).  Lymphadenopathy:     Cervical: No cervical adenopathy.  Skin:    General: Skin is warm and dry.     Capillary Refill: Capillary refill takes less than 2 seconds.     Findings: No rash.  Neurological:     General: No focal deficit present.     Mental Status: He is alert and oriented to person, place, and time. Mental status is at baseline.     GCS: GCS eye subscore is 4. GCS verbal subscore is 5. GCS motor subscore is 6.     Cranial Nerves: Cranial nerves 2-12 are intact. No cranial nerve deficit, dysarthria or facial asymmetry.     Sensory: Sensation is intact. No sensory deficit.     Motor: Motor function is intact. No  weakness, tremor, abnormal muscle tone or pronator drift.     Coordination: Coordination is intact. Romberg sign negative. Coordination normal. Finger-Nose-Finger Test normal.     Gait: Gait is intact. Gait normal.     Deep Tendon Reflexes: Reflexes normal.     Comments: Strength and sensation intact to bilateral upper and lower extremities (5/5). Moves all 4 extremities with normal coordination voluntarily. Non-focal neuro exam.   Psychiatric:        Mood and Affect: Mood normal.        Speech: Speech normal.        Behavior: Behavior normal.        Thought Content: Thought content normal.        Judgment: Judgment normal.      UC Treatments / Results  Labs (all labs ordered are listed, but only abnormal results are displayed) Labs Reviewed - No data to display  EKG   Radiology No results found.  Procedures Procedures (including critical care time)  Medications Ordered in UC Medications - No data to display  Initial Impression / Assessment and Plan / UC Course  I have reviewed the triage vital signs and the nursing notes.  Pertinent labs & imaging results that were available during my care of the patient were reviewed by me and considered in my medical decision making (see chart for details).   1.  Cervical radiculopathy, sore throat Chart reviewed, patient has an extensive history of similar pain to the bilateral neck.  No changes in nature of pain today or red flags on exam. Deferred imaging based on stable musculoskeletal exam findings, he is neurologically intact to baseline, and I have low suspicion for acute bony abnormality given atraumatic presentation of pain.  His blood pressure is elevated at 180/80 in the clinic today due to  pain. He is not a candidate for NSAIDs due to hypertension. He states he is taking his blood pressure medications as prescribed (amlodipine  10 mg daily).  We will treat this with prednisone  burst once daily for 5 days. Baclofen  5 mg at  bedtime as needed.  Drowsiness precautions discussed regarding baclofen  use.  Heat and gentle range of motion exercises encouraged.  Recommend follow-up with his PCP for ongoing evaluation and management of his chronic bilateral cervical radicular pain.  Sore throat is likely viral in nature. HEENT exam is mostly unremarkable. Low suspicion for bacterial pharyngitis, he does not meet [facility] criteria for strep testing, therefore we did not test for strep or COVID given length of symptoms (1 month).  He may trial use of some Claritin 5 mg at bedtime to clear up postnasal drainage which may in turn help sore throat. Salt water gargles may be used as needed as well as Tylenol 650 mg every 6 hours as needed.  Counseled patient on potential for adverse effects with medications prescribed/recommended today, strict ER and return-to-clinic precautions discussed, patient verbalized understanding.    Final Clinical Impressions(s) / UC Diagnoses   Final diagnoses:  Cervical radiculopathy  Sore throat     Discharge Instructions      Su dolor se debe a una inflamacin en el cuello causada por un espasmo muscular.  Tome las pastillas de prednisona esteroide segn lo recetado a Glass blower/designer de [place]oy. 2 pastillas de 40 mg una vez al da cada maana con las comidas/desayuno durante 5 [place]. No tome ibuprofeno, naproxeno ni otros AINE mientras est tomando pastillas de esteroides, ya que pueden causar [employer].  Tome 5 mg de baclofeno antes de acostarse, segn sea necesario, para el espasmo muscular.  Aplique calor en la parte superior de la espalda y el cuello para Engineer, materials.  Consulte con su mdico de cabecera para una evaluacin continua del manejo de su dolor de cuello crnico.  Your pain is due to inflammation in your neck due to muscle spasm.   Take prednisone  steroid pills as prescribed starting today.  2 pills- 40mg - once daily each morning with food/breakfast for 5 days Do  not take ibuprofen/naproxen /other NSAIDs while taking steroid pills as this can cause upset stomach.    Take baclofen  5 mg at bedtime as needed for muscle spasm.  Please use heat to the upper back and neck to alleviate pain.  Follow-up with your primary care provider for ongoing evaluation management of your chronic neck pain.     ED Prescriptions     Medication Sig Dispense Auth. Provider   predniSONE  (DELTASONE ) 20 MG tablet Take 2 tablets (40 mg total) by mouth daily with breakfast for 5 days. 10 tablet Enedelia Dorna HERO, FNP   Baclofen  5 MG TABS Take 1 tablet (5 mg total) by mouth at bedtime as needed. 20 tablet Enedelia Dorna HERO, FNP      PDMP not reviewed this encounter.   Enedelia Dorna HERO, FNP 09/07/23 1918    Enedelia Dorna HERO, FNP 09/07/23 1919

## 2023-09-08 NOTE — Telephone Encounter (Signed)
 Requested medication (s) are due for refill today: yes  Requested medication (s) are on the active medication list: yes  Last refill:  02/15/23 #60  Future visit scheduled: no  Notes to clinic:  overdue PSA   Requested Prescriptions  Pending Prescriptions Disp Refills   tamsulosin  (FLOMAX ) 0.4 MG CAPS capsule 60 capsule 3    Sig: Take 2 capsules (0.8 mg total) by mouth daily.     Urology: Alpha-Adrenergic Blocker Failed - 09/08/2023 12:45 PM      Failed - PSA in normal range and within 360 days    Prostate Specific Ag, Serum  Date Value Ref Range Status  05/25/2021 8.2 (H) 0.0 - 4.0 ng/mL Final    Comment:    Roche ECLIA methodology. According to the American Urological Association, Serum PSA should decrease and remain at undetectable levels after radical prostatectomy. The AUA defines biochemical recurrence as an initial PSA value 0.2 ng/mL or greater followed by a subsequent confirmatory PSA value 0.2 ng/mL or greater. Values obtained with different assay methods or kits cannot be used interchangeably. Results cannot be interpreted as absolute evidence of the presence or absence of malignant disease.          Failed - Last BP in normal range    BP Readings from Last 1 Encounters:  09/07/23 (!) 180/80         Passed - Valid encounter within last 12 months    Recent Outpatient Visits           2 months ago Strain of left trapezius muscle, initial encounter   [facility] Celestia Rosaline SQUIBB, NP   3 months ago Essential hypertension   [facility] Celestia Rosaline SQUIBB, NP   5 months ago Cervical radiculopathy   [facility] [place] Theotis Haze ORN, NP   7 months ago Cervical disc disease   [place] Celestia Rosaline SQUIBB, NP   7 months ago Essential hypertension   [facility] [place] Delbert Clam, MD

## 2023-09-09 ENCOUNTER — Other Ambulatory Visit: Payer: Self-pay

## 2023-09-09 MED ORDER — TAMSULOSIN HCL 0.4 MG PO CAPS
0.8000 mg | ORAL_CAPSULE | Freq: Every day | ORAL | 3 refills | Status: DC
  Filled 2023-09-09: qty 60, 30d supply, fill #0

## 2023-09-12 ENCOUNTER — Other Ambulatory Visit: Payer: Self-pay

## 2023-09-14 ENCOUNTER — Other Ambulatory Visit: Payer: Self-pay

## 2023-09-14 ENCOUNTER — Ambulatory Visit (INDEPENDENT_AMBULATORY_CARE_PROVIDER_SITE_OTHER)

## 2023-09-14 ENCOUNTER — Encounter (HOSPITAL_COMMUNITY): Payer: Self-pay

## 2023-09-14 ENCOUNTER — Ambulatory Visit (HOSPITAL_COMMUNITY)
Admission: EM | Admit: 2023-09-14 | Discharge: 2023-09-14 | Disposition: A | Discharge status: Home / Self Care | Attending: Physician Assistant | Admitting: Physician Assistant

## 2023-09-14 DIAGNOSIS — M542 Cervicalgia: Secondary | ICD-10-CM

## 2023-09-14 DIAGNOSIS — M47812 Spondylosis without myelopathy or radiculopathy, cervical region: Secondary | ICD-10-CM | POA: Diagnosis not present

## 2023-09-14 DIAGNOSIS — M5412 Radiculopathy, cervical region: Secondary | ICD-10-CM | POA: Diagnosis not present

## 2023-09-14 DIAGNOSIS — M4802 Spinal stenosis, cervical region: Secondary | ICD-10-CM | POA: Diagnosis not present

## 2023-09-14 DIAGNOSIS — G8929 Other chronic pain: Secondary | ICD-10-CM | POA: Diagnosis not present

## 2023-09-14 DIAGNOSIS — R202 Paresthesia of skin: Secondary | ICD-10-CM | POA: Diagnosis not present

## 2023-09-14 DIAGNOSIS — M50222 Other cervical disc displacement at C5-C6 level: Secondary | ICD-10-CM | POA: Diagnosis not present

## 2023-09-14 MED ORDER — TIZANIDINE HCL 2 MG PO TABS
2.0000 mg | ORAL_TABLET | Freq: Every evening | ORAL | 0 refills | Status: DC | PRN
  Filled 2023-09-14: qty 7, 7d supply, fill #0

## 2023-09-14 NOTE — ED Triage Notes (Signed)
 Patient here today with c/o neck and upper back pain that has been going on for the past few months. Patient was here last week. Patient took the medication prescribed with no relief.

## 2023-09-14 NOTE — Discharge Instructions (Addendum)
 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  The gabapentin  that you are prescribed should help with this so I recommend following up with your primary care to determine if we can increase the dose.  I have called in a low-dose of a muscle relaxer to help with pain.  This will make you sleepy so do not drive or drink alcohol with taking it.  Continue with lidocaine  cream over the specific area as well as Tylenol.  If you have any worsening or changing symptoms please return for reevaluation.  This is likely coming from arthritis in your neck so you may benefit from seeing a physical therapist and/or orthopedic provider for additional evaluation.  Me preocupa que tengas algo llamado notalgia parestsica.  La gabapentina que le recetaron debera ayudar con esto, por lo que recomiendo hacer un seguimiento con su atencin primaria para determinar si podemos aumentar la dosis.  He solicitado una dosis baja de un relajante muscular para ayudar con el dolor.  Esto le dar sueo, as que no conduzca ni beba alcohol mientras lo toma.  Contine con crema de lidocana sobre el rea especfica y Tylenol.  Si tiene algn sntoma que empeora o [place], regrese para una reevaluacin.  Es probable que esto se deba a la artritis en el cuello, por lo que puede ser beneficioso consultar a un fisioterapeuta y/o a Psychologist, sport and exercise de ortopedia para una evaluacin adicional.

## 2023-09-14 NOTE — ED Provider Notes (Signed)
 [facility]    CSN: [id number] Arrival date & time: 09/14/23  1559      History   Chief Complaint Chief Complaint  Patient presents with   Neck Pain    HPI Patrick Ortega is a 83 y.o. male.   Patient presents today with a 46-month history of chronic neck and back pain that has worsened in the past few months.  He is Spanish-speaking and video interpreter was utilized during visit.  He has been seen twice by our clinic on 08/04/2023 at which point he had films of his scapula that were showed degeneration without acute osseous abnormality was started on baclofen .  This was ineffective and so he was seen again on 09/07/2023 where he was given corticosteroids without improvement of symptoms.  He reports that he has been seen by other providers but has not had any imaging.  He did have cervical spine films in 2022 that showed degeneration without acute abnormality.  He has never had any spinal injury or surgery.  He previously had some numbness and paresthesias in his right arm but this is since resolved.  Denies any associated rash, fever, chest pain, shortness of breath, nausea, vomiting.  He has tried patches as well as been given injections of ketorolac  with minimal improvement of symptoms.  He has never seen a specialist.  He reports pain is rated 7 on a 0-10 pain scale, described as a burning sensation with periodic sharp pains, no alleviating factors identified.    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 Authorizing Provider  tiZANidine  (ZANAFLEX ) 2 MG tablet Take 1 tablet (2 mg total) by mouth at bedtime as needed for muscle spasms. 09/14/23  Yes Krina Mraz K, PA-C  amLODipine  (NORVASC ) 10 MG tablet Take 1 tablet (10 mg total) by mouth daily. 07/12/23   Celestia Rosaline SQUIBB, NP  gabapentin  (NEURONTIN ) 100 MG capsule Take 1 capsule (100 mg total) by mouth 3 (three) times daily. FOR numbness in arms and hands 04/05/23   Theotis Haze ORN, NP  Lidocaine  10 % CREA Apply 10 mg topically 3 (three) times daily as needed. 07/05/23   Celestia Rosaline SQUIBB, NP  tamsulosin  (FLOMAX ) 0.4 MG CAPS capsule Take 2 capsules (0.8 mg total) by mouth daily. 09/09/23   Celestia Rosaline SQUIBB, NP    Family History Family History  Problem Relation Age of Onset   Ulcers Mother     Social History Social History   Tobacco Use   Smoking status: Former    Current packs/day: 0.25    Average packs/day: 0.3 packs/day for 0.5 years (0.1 ttl pk-yrs)    Types: Cigarettes  Smokeless tobacco: Never  Vaping Use   Vaping status: Never Used  Substance Use Topics   Alcohol use: Not Currently     Allergies   Patient has no known allergies.   Review of Systems Review of Systems  Constitutional:  Positive for activity change. Negative for appetite change, fatigue and fever.  Respiratory:  Negative for shortness of breath.   Cardiovascular:  Negative for chest pain and palpitations.  Gastrointestinal:  Negative for abdominal pain, diarrhea, nausea and vomiting.  Musculoskeletal:  Positive for arthralgias and neck pain. Negative for myalgias.  Neurological:  Negative for weakness and numbness.      Physical Exam Triage Vital Signs ED Triage Vitals  Encounter Vitals Group     BP 09/14/23 1644 (!) 164/84     Girls Systolic BP Percentile --      Girls Diastolic BP Percentile --      Boys Systolic BP Percentile --      Boys Diastolic BP Percentile --      Pulse Rate 09/14/23 1644 81     Resp 09/14/23 1644 16     Temp 09/14/23 1644 97.8 F (36.6 C)     Temp Source 09/14/23 1644 Oral     SpO2 09/14/23 1644 94 %     Weight --      Height --      Head Circumference --      Peak Flow --      Pain Score 09/14/23 1645 7     Pain Loc --      Pain Education --      Exclude from Growth Chart --    No data found.  Updated Vital Signs BP (!) 164/84 (BP Location: Left Arm)   Pulse 81   Temp 97.8 F (36.6 C) (Oral)   Resp 16   SpO2 94%   Visual Acuity Right Eye Distance:   Left Eye Distance:   Bilateral Distance:    Right Eye Near:   Left Eye Near:    Bilateral Near:     Physical Exam Vitals reviewed.  Constitutional:      General: He is awake.     Appearance: Normal appearance. He is well-developed. He is not ill-appearing.     Comments: Very pleasant male appears stated age in no acute distress sitting comfortably in exam room  HENT:     Head: Normocephalic and atraumatic.  Cardiovascular:     Rate and Rhythm: Normal rate and regular rhythm.     Heart sounds: Normal heart sounds, S1 normal and S2 normal. No murmur heard. Pulmonary:     Effort: Pulmonary effort is normal.     Breath sounds: Normal breath sounds. No stridor. No wheezing, rhonchi or rales.     Comments: Clear to auscultation bilaterally Musculoskeletal:     Cervical back: Tenderness and bony tenderness present.     Thoracic back: Tenderness present. No bony tenderness.     Lumbar back: No tenderness or bony tenderness. Negative right straight leg raise test and negative left straight leg raise test.       Back:     Comments: Back: Pain percussion of cervical vertebrae but without deformity  or step-off.  Tenderness palpation of right cervical and thoracic paraspinal muscles with significant pain medial to right scapula.  No deformity noted.  Normal active range of motion of shoulder.  Strength 5/5 bilateral upper extremities.  Skin:    Findings: No rash.  Neurological:     Mental  Status: He is alert.  Psychiatric:        Behavior: Behavior is cooperative.      UC Treatments / Results  Labs (all labs ordered are listed, but only abnormal results are displayed) Labs Reviewed - No data to display  EKG   Radiology DG Cervical Spine Complete Result Date: 09/14/2023 EXAM: 6 or more VIEW(S) XRAY OF THE CERVICAL SPINE 09/14/2023 05:40:36 PM COMPARISON: 11/20/2020 CLINICAL HISTORY: Neck pain. Patient here today with c/o neck and upper back pain that has been going on for the past few months. Patient was here last week. Patient took the medication prescribed with no relief. FINDINGS: BONES: No acute fracture. No aggressive appearing osseous lesion. Straightening of the normal cervical lordosis. Trace retrolisthesis of C5 on C6. DISCS AND DEGENERATIVE CHANGES: Degenerative endplate osteophytes at multiple levels. There is mild disc space narrowing at C4-5 and C5-6. Mild-to-moderate disc space narrowing at C6-7. Facet arthrosis at multiple levels. 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 SOFT TISSUES: No prevertebral soft tissue swelling. The visualized lungs appear clear. IMPRESSION: 1. Degenerative changes as above. 2. Mild disc space narrowing at C4-5 and C5-6, and mild-to-moderate disc space narrowing at C6-7. 3. 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 Electronically signed by: Donnice Mania MD 09/14/2023 05:50 PM EDT RP Workstation: [id number]    Procedures Procedures (including critical care time)  Medications  Ordered in UC Medications - No data to display  Initial Impression / Assessment and Plan / UC Course  I have reviewed the triage vital signs and the nursing notes.  Pertinent labs & imaging results that were available during my care of the patient were reviewed by me and considered in my medical decision making (see chart for details).     Patient is well-appearing, afebrile, nontoxic, nontachycardic.  Given his persistent and worsening symptoms cervical spine films were updated which showed degenerative changes without acute osseous abnormality.  Given location and description of his discomfort I am concerned for notalgia paresthetica.  He is already on gabapentin  and we discussed that this is an appropriate medication but will add a low-dose muscle relaxer to see if this provides additional symptom relief.  No indication for dose adjustment based on metabolic panel from 01/13/2023 with creatinine of 1.14 and calculated creatinine clearance of 51 mL/min.  He was encouraged to use topical lidocaine  as well as Tylenol for additional pain relief.  Recommend close follow-up with primary care as he may benefit from seeing a specialist given his ongoing pain that has been difficult to manage and would likely need referral.  We discussed that if anything worsens or changes he should return for reevaluation.  Strict return precautions given.    Final Clinical Impressions(s) / UC Diagnoses   Final diagnoses:  Chronic neck pain  Notalgia paresthetica     Discharge Instructions      I am concerned that you have something called notalgia paresthetica.  The gabapentin  that you are prescribed should help with this so I recommend following up with your primary care to determine if we can increase the dose.  I have called in a low-dose of a muscle relaxer to help with pain.  This will make you sleepy so do not drive or drink alcohol with taking it.  Continue with lidocaine  cream over the specific area as well  as Tylenol.  If you have any worsening or changing symptoms please return for reevaluation.  This is likely coming from arthritis in your neck so you may benefit from seeing a physical therapist and/or orthopedic provider for additional evaluation.  Me preocupa que tengas algo llamado notalgia parestsica.  La gabapentina que le recetaron debera ayudar con esto, por lo que recomiendo hacer un seguimiento con su atencin primaria para determinar si podemos aumentar la dosis.  He solicitado una dosis baja de un relajante muscular para ayudar con el dolor.  Esto le dar sueo, as que no conduzca ni beba alcohol mientras lo toma.  Contine con crema de lidocana sobre el rea especfica y Tylenol.  Si tiene algn sntoma que empeora o [place], regrese para una reevaluacin.  Es probable que esto se deba a la artritis en el cuello, por lo que puede ser beneficioso consultar a un fisioterapeuta y/o a Psychologist, sport and exercise de ortopedia para una evaluacin adicional.     ED Prescriptions     Medication Sig Dispense Auth. Provider   tiZANidine  (ZANAFLEX ) 2 MG tablet Take 1 tablet (2 mg total) by mouth at bedtime as needed for muscle spasms. 7 tablet Lianah Peed K, PA-C      PDMP not reviewed this encounter.   Sherrell Rocky POUR, PA-C 09/14/23 1841

## 2023-09-15 ENCOUNTER — Other Ambulatory Visit: Payer: Self-pay

## 2023-11-02 ENCOUNTER — Encounter (HOSPITAL_COMMUNITY): Payer: Self-pay

## 2023-11-02 ENCOUNTER — Ambulatory Visit (HOSPITAL_COMMUNITY)
Admission: EM | Admit: 2023-11-02 | Discharge: 2023-11-02 | Disposition: A | Discharge status: Home / Self Care | Attending: Nurse Practitioner | Admitting: Nurse Practitioner

## 2023-11-02 ENCOUNTER — Other Ambulatory Visit: Payer: Self-pay

## 2023-11-02 DIAGNOSIS — R1013 Epigastric pain: Secondary | ICD-10-CM | POA: Diagnosis not present

## 2023-11-02 DIAGNOSIS — M542 Cervicalgia: Secondary | ICD-10-CM | POA: Diagnosis not present

## 2023-11-02 DIAGNOSIS — K219 Gastro-esophageal reflux disease without esophagitis: Secondary | ICD-10-CM | POA: Diagnosis not present

## 2023-11-02 DIAGNOSIS — R1310 Dysphagia, unspecified: Secondary | ICD-10-CM | POA: Diagnosis not present

## 2023-11-02 DIAGNOSIS — R079 Chest pain, unspecified: Secondary | ICD-10-CM | POA: Diagnosis not present

## 2023-11-02 DIAGNOSIS — G8929 Other chronic pain: Secondary | ICD-10-CM

## 2023-11-02 MED ORDER — OMEPRAZOLE 20 MG PO CPDR
20.0000 mg | DELAYED_RELEASE_CAPSULE | Freq: Every day | ORAL | 0 refills | Status: DC
  Filled 2023-11-02: qty 30, 30d supply, fill #0

## 2023-11-02 NOTE — ED Triage Notes (Signed)
 Patient here today with c/o back pain going from his upper back and lower back X 9 months. Patient also has some neck pain.   Patient is also c/o chest pain on the left side chest pain for months now. Patient takes Amlodipine .

## 2023-11-02 NOTE — Discharge Instructions (Addendum)
 Fue atendido iac[employer] por varios problemas continuos, incluyendo dolor de cuello, molestia en el pecho, dolor de garganta al tragar y dolor abdominal leve. Su examen fsico y signos vitales estn estables, y no hay indicios de una condicin de training and development officer. La mayora de sus sntomas parecen estar relacionados con problemas crnicos que deben manejarse mediante seguimiento con su mdico de atencin primaria y los especialistas correspondientes.  Para su dolor crnico de cuello, contine con movimientos suaves y evite actividades que warden/ranger. Debe hacer un seguimiento con el Patrick Ortega en [facility] para una evaluacin ms detallada y opciones de [place]. El manejo del dolor tambin puede ser til si sus sntomas continan interfiriendo con sus actividades diarias.  Para su dolor de garganta, haga un seguimiento con su mdico de atencin primaria para hablar sobre completar el estudio de deglucin que se le indic previamente. Esta prueba puede ayudar a identificar si sus sntomas estn relacionados con el proceso de tragar o con irritacin en la garganta. Si su mdico lo considera necesario, puede requerir una referencia a un especialista, psychiatrist (ENT) o futures trader. Su molestia leve en el pecho no parece estar relacionada con el corazn segn sus sntomas e historial, pero aun as debe discutirlo con su mdico de atencin primaria, ya que una referencia a cardiologa podra ser til para descartar cualquier problema subyacente.  Su dolor de the [facility] travelers se deba al reflujo cido (ERGE). Se le recet omeprazol para tomar diariamente con el fin de reducir el cido estomacal y aes corporation sntomas. Tome este medicamento antes de comer en la [place]. Evite alimentos picantes, grasosos o cidos, la cafena y las comidas grandes tarde en la noche. Trate de comer comidas ms pequeas y frecuentes, y permanezca sentado o de pie por lo menos 30  minutos despus de comer.  Debe hacer un seguimiento con su mdico de atencin primaria dentro de la prxima semana para continuar con la evaluacin y coordinacin de su cuidado. Acuda de inmediato a la sala de emergencias si presenta dolor en el pecho nuevo o que empeora, dificultad para respirar, vmito con sangre, heces negras o alquitranadas, dificultad para tragar lquidos, debilidad nueva, entumecimiento, mareos o cualquier otro cambio preocupante en su condicin.  You were seen today for multiple ongoing issues, including neck pain, chest discomfort, throat pain when swallowing, and mild stomach pain. Your exam and vital signs are stable, and there are no signs of an emergency condition at this time. Most of your symptoms appear related to chronic issues that should be managed through follow-up with your primary care provider and specialists. For your chronic neck pain, continue gentle movement and avoid activities that make the pain worse. You should follow up with Patrick Ortega at [facility] for further evaluation and treatment options. Pain management may also be helpful if your symptoms continue to interfere with daily activities. For your throat pain, follow up with your primary care provider to discuss completing the swallow study that was previously ordered. This test can help identify whether your symptoms are related to swallowing or throat irritation. If your doctor feels it is appropriate, a referral to a specialist such as an ENT or a gastroenterologist may be needed. Your mild chest discomfort does not appear to be heart-related based on your symptoms and history, but you should still discuss this with your primary care provider, as a referral to cardiology may be helpful to rule out any underlying issues. Your stomach pain is  likely due to acid reflux (GERD). You were prescribed omeprazole to take daily to reduce stomach acid and improve symptoms. Take this medication before eating  in the morning. Avoid spicy, greasy, or acidic foods, caffeine, and large meals late at night. Try eating smaller, more frequent meals and remain upright for at least 30 minutes after eating. Follow up with your primary care provider within the next week for continued evaluation and coordination of your care. Go to the emergency department immediately if you develop new or worsening chest pain, shortness of breath, vomiting blood, black or tarry stools, trouble swallowing liquids, new weakness, numbness, dizziness, or any other concerning changes in your condition.

## 2023-11-02 NOTE — ED Provider Notes (Signed)
 [facility]    CSN: [id number] Arrival date & time: 11/02/23  1339      History   Chief Complaint Chief Complaint  Patient presents with   Back Pain   Chest Pain    HPI Patrick Ortega is a 83 y.o. male.   Spanish Interpreter Lorette [phone]  The patient presents to urgent care with multiple complaints, including neck pain, headache, sore throat, left-sided chest pain, and epigastric discomfort. He has a known history of chronic pain related to cervical radiculopathy, cervical disc disease, and cervical spinal stenosis, dating back to at least December 2021. He describes his current neck pain as similar to his previous episodes. He has tried multiple therapies in the past, including Voltaren , gabapentin , Lidoderm  patches, meloxicam , tizanidine , naproxen , and tramadol , none of which have provided lasting relief. He has had follow-up with his primary care provider and was previously evaluated by Dr. Debby with [facility] in December 2024. MRI of the cervical spine was performed in March 2022. The patient also participated in physical therapy but discontinued after two sessions due to lack of improvement. In May, he was referred again to Dr. Debby but did not go, stating he never received the address. He denies numbness, tingling, weakness, or loss of strength or sensation in the upper extremities. He was last seen in urgent care on 09/03 and 09/10 for similar complaints and was advised to follow up with his primary care provider, with pain management recommended as a potential option.  The patient also reports right-sided throat pain that occurs with swallowing saliva, food, and pills, ongoing since January of this year. He denies choking, difficulty managing oral secretions, or voice changes. He previously discussed this with his primary care provider, who ordered a swallow study, though the patient did not complete it.  Additionally, he reports intermittent, mild,  left-sided chest pain described as a shooting sensation without radiation. The pain began in January of this year and is not associated with exertion, shortness of breath, dizziness, palpitations, or leg swelling. He has a history of hypertension and takes Norvasc .  Finally, the patient describes mild epigastric pain for the past five days, associated with some nausea but no vomiting. He has a history of GERD but is not currently taking any medication for it. He denies burning in the chest, abdominal bloating, or changes in bowel habits, including melena or hematochezia. The pain is mild, without clear aggravating or relieving factors.  The following sections of the patient's history were reviewed and updated as appropriate: allergies, current medications, past family history, past medical history, past social history, past surgical history, and problem list.      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 Authorizing Provider  omeprazole (PRILOSEC) 20 MG capsule Take 1 capsule (20 mg total) by mouth daily. 11/02/23  Yes Iola Lukes, FNP  amLODipine  (NORVASC ) 10 MG tablet Take 1 tablet (10 mg total) by  mouth daily. 07/12/23   Celestia Rosaline SQUIBB, NP  gabapentin  (NEURONTIN ) 100 MG capsule Take 1 capsule (100 mg total) by mouth 3 (three) times daily. FOR numbness in arms and hands 04/05/23   Theotis Haze ORN, NP  Lidocaine  10 % CREA Apply 10 mg topically 3 (three) times daily as needed. 07/05/23   Celestia Rosaline SQUIBB, NP  tamsulosin  (FLOMAX ) 0.4 MG CAPS capsule Take 2 capsules (0.8 mg total) by mouth daily. 09/09/23   Celestia Rosaline SQUIBB, NP    Family History Family History  Problem Relation Age of Onset   Ulcers Mother     Social History Social History   Tobacco Use   Smoking status: Former    Current packs/day: 0.25    Average packs/day: 0.3 packs/day for 0.5 years (0.1 ttl pk-yrs)    Types: Cigarettes   Smokeless tobacco: Never  Vaping Use   Vaping status: Never Used  Substance Use Topics   Alcohol use: Not Currently     Allergies   Patient has no known allergies.   Review of Systems Review of Systems  Constitutional:  Negative for fever.  HENT:  Positive for sore throat and trouble swallowing. Negative for congestion and voice change.   Respiratory:  Negative for cough and shortness of breath.   Cardiovascular:  Positive for chest pain. Negative for palpitations and leg swelling.  Gastrointestinal:  Positive for abdominal pain and nausea. Negative for abdominal distention, anal bleeding, blood in stool, constipation, diarrhea and vomiting.  Musculoskeletal:  Positive for arthralgias and neck pain. Negative for back pain and neck stiffness.  Neurological:  Positive for headaches. Negative for dizziness, speech difficulty, weakness and numbness.  All other systems reviewed and are negative.    Physical Exam Triage Vital Signs ED Triage Vitals  Encounter Vitals Group     BP 11/02/23 1448 (!) 145/74     Girls Systolic BP Percentile --      Girls Diastolic BP Percentile --      Boys Systolic BP Percentile --      Boys Diastolic BP Percentile --      Pulse Rate 11/02/23  1448 (!) 107     Resp 11/02/23 1448 16     Temp 11/02/23 1448 97.8 F (36.6 C)     Temp Source 11/02/23 1448 Oral     SpO2 11/02/23 1448 95 %     Weight --      Height --      Head Circumference --      Peak Flow --      Pain Score 11/02/23 1447 7     Pain Loc --      Pain Education --      Exclude from Growth Chart --    No data found.  Updated Vital Signs BP (!) 145/74 (BP Location: Left Arm)   Pulse (!) 107   Temp 97.8 F (36.6 C) (Oral)   Resp 16   SpO2 95%   Visual Acuity Right Eye Distance:   Left Eye Distance:   Bilateral Distance:    Right Eye Near:   Left Eye Near:    Bilateral Near:     Physical Exam Vitals reviewed.  Constitutional:  General: He is awake. He is not in acute distress.    Appearance: Normal appearance. He is well-developed. He is not ill-appearing, toxic-appearing or diaphoretic.  HENT:     Head: Normocephalic.     Right Ear: Hearing normal.     Left Ear: Hearing normal.     Nose: Nose normal.     Mouth/Throat:     Mouth: Mucous membranes are moist.     Pharynx: Oropharynx is clear. Uvula midline.  Eyes:     General: Vision grossly intact.     Conjunctiva/sclera: Conjunctivae normal.  Neck:     Trachea: Trachea and phonation normal.  Cardiovascular:     Rate and Rhythm: Normal rate and regular rhythm.     Pulses: Normal pulses.     Heart sounds: Normal heart sounds.  Pulmonary:     Effort: Pulmonary effort is normal.     Breath sounds: Normal breath sounds and air entry.  Abdominal:     General: Bowel sounds are normal. There is no distension.     Palpations: Abdomen is soft.     Tenderness: There is no abdominal tenderness.  Musculoskeletal:        General: Normal range of motion.     Cervical back: Normal range of motion and neck supple. No swelling, deformity, rigidity, spasms, torticollis or crepitus. Pain with movement present. No muscular tenderness. Normal range of motion.     Right lower leg: No edema.     Left  lower leg: No edema.  Lymphadenopathy:     Cervical: No cervical adenopathy.  Skin:    General: Skin is warm and dry.  Neurological:     General: No focal deficit present.     Mental Status: He is alert and oriented to person, place, and time.     Cranial Nerves: No cranial nerve deficit.     Sensory: Sensation is intact. No sensory deficit.     Motor: Motor function is intact. No weakness.     Coordination: Coordination is intact.     Gait: Gait is intact.  Psychiatric:        Speech: Speech normal.        Behavior: Behavior is cooperative.      UC Treatments / Results  Labs (all labs ordered are listed, but only abnormal results are displayed) Labs Reviewed - No data to display  EKG   Radiology No results found.  Procedures Procedures (including critical care time)  Medications Ordered in UC Medications - No data to display  Initial Impression / Assessment and Plan / UC Course  I have reviewed the triage vital signs and the nursing notes.  Pertinent labs & imaging results that were available during my care of the patient were reviewed by me and considered in my medical decision making (see chart for details).     The patient presents with multiple complaints, including chronic neck pain, intermittent chest discomfort, right-sided throat pain, and mild epigastric discomfort. He is alert, oriented, afebrile, and nontoxic in appearance. Physical examination reveals full range of motion of all extremities without weakness or focal neurological deficits. His current presentation is consistent with chronic, stable conditions, and there are no findings to suggest an urgent or emergent process at this time.  Given the chronicity of his neck pain and prior diagnosis of cervical radiculopathy, cervical disc disease, and cervical spinal stenosis, the patient was advised to follow up with Dr. Debby at [facility] for continued management. He was also encouraged  to  follow up with his primary care provider for further evaluation of his intermittent chest discomfort and ongoing swallowing pain, as these may warrant a swallow study or possible cardiology referral depending on findings.  His mild epigastric discomfort is most consistent with gastroesophageal reflux disease, for which he is currently untreated. Omeprazole was prescribed for acid suppression and symptom management. The patient was advised to take the medication daily as directed, avoid spicy or greasy foods, eat smaller meals, and refrain from lying down immediately after eating. He was instructed to return to urgent care or the emergency department if he develops worsening chest pain, shortness of breath, vomiting blood, black stools, difficulty swallowing, weakness, or any new or concerning symptoms.  Today's evaluation has revealed no signs of a dangerous process. Discussed diagnosis with patient and/or guardian. Patient and/or guardian aware of their diagnosis, possible red flag symptoms to watch out for and need for close follow up. Patient and/or guardian understands verbal and written discharge instructions. Patient and/or guardian comfortable with plan and disposition.  Patient and/or guardian has a clear mental status at this time, good insight into illness (after discussion and teaching) and has clear judgment to make decisions regarding their care  Documentation was completed with the aid of voice recognition software. Transcription may contain typographical errors.  Final Clinical Impressions(s) / UC Diagnoses   Final diagnoses:  Chronic neck pain  Chronic chest pain  Abdominal pain, chronic, epigastric  Gastroesophageal reflux disease without esophagitis  Dysphagia, unspecified type     Discharge Instructions      Fue atendido hoy por varios problemas continuos, incluyendo dolor de cuello, molestia en el pecho, dolor de garganta al tragar y dolor abdominal leve. Su examen fsico y  signos vitales estn estables, y no hay indicios de una condicin de training and development officer. La mayora de sus sntomas parecen estar relacionados con problemas crnicos que deben manejarse mediante seguimiento con su mdico de atencin primaria y los especialistas correspondientes.  Para su dolor crnico de cuello, contine con movimientos suaves y evite actividades que warden/ranger. Debe hacer un seguimiento con el Dr. Debby en [facility] para una evaluacin ms detallada y opciones de [address]. El manejo del dolor tambin puede ser til si sus sntomas continan interfiriendo con sus actividades diarias.  Para su dolor de garganta, haga un seguimiento con su mdico de atencin primaria para hablar sobre completar el estudio de deglucin que se le indic previamente. Esta prueba puede ayudar a identificar si sus sntomas estn relacionados con el proceso de tragar o con irritacin en la garganta. Si su mdico lo considera necesario, puede requerir una referencia a un especialista, psychiatrist (ENT) o futures trader. Su molestia leve en el pecho no parece estar relacionada con el corazn segn sus sntomas e historial, pero aun as debe discutirlo con su mdico de atencin primaria, ya que una referencia a cardiologa podra ser til para descartar cualquier problema subyacente.  Su dolor de the [place] [employer] se deba al reflujo cido (ERGE). Se le recet omeprazol para tomar diariamente con el fin de reducir el cido estomacal y aes corporation sntomas. Tome este medicamento antes de comer en la [place]. Evite alimentos picantes, grasosos o cidos, la cafena y las comidas grandes tarde en la noche. Trate de comer comidas ms pequeas y frecuentes, y permanezca sentado o de pie por lo menos 30 minutos despus de comer.  Debe hacer un seguimiento con su mdico de atencin primaria dentro de la  prxima semana para continuar con la evaluacin y coordinacin de su  cuidado. Acuda de inmediato a la sala de emergencias si presenta dolor en el pecho nuevo o que empeora, dificultad para respirar, vmito con sangre, heces negras o alquitranadas, dificultad para tragar lquidos, debilidad nueva, entumecimiento, mareos o cualquier otro cambio preocupante en su condicin.  You were seen today for multiple ongoing issues, including neck pain, chest discomfort, throat pain when swallowing, and mild stomach pain. Your exam and vital signs are stable, and there are no signs of an emergency condition at this time. Most of your symptoms appear related to chronic issues that should be managed through follow-up with your primary care provider and specialists. For your chronic neck pain, continue gentle movement and avoid activities that make the pain worse. You should follow up with Dr. Debby at [facility] for further evaluation and treatment options. Pain management may also be helpful if your symptoms continue to interfere with daily activities. For your throat pain, follow up with your primary care provider to discuss completing the swallow study that was previously ordered. This test can help identify whether your symptoms are related to swallowing or throat irritation. If your doctor feels it is appropriate, a referral to a specialist such as an ENT or a gastroenterologist may be needed. Your mild chest discomfort does not appear to be heart-related based on your symptoms and history, but you should still discuss this with your primary care provider, as a referral to cardiology may be helpful to rule out any underlying issues. Your stomach pain is likely due to acid reflux (GERD). You were prescribed omeprazole to take daily to reduce stomach acid and improve symptoms. Take this medication before eating in the morning. Avoid spicy, greasy, or acidic foods, caffeine, and large meals late at night. Try eating smaller, more frequent meals and remain upright for at least 30  minutes after eating. Follow up with your primary care provider within the next week for continued evaluation and coordination of your care. Go to the emergency department immediately if you develop new or worsening chest pain, shortness of breath, vomiting blood, black or tarry stools, trouble swallowing liquids, new weakness, numbness, dizziness, or any other concerning changes in your condition.     ED Prescriptions     Medication Sig Dispense Auth. Provider   omeprazole (PRILOSEC) 20 MG capsule Take 1 capsule (20 mg total) by mouth daily. 30 capsule Iola Lukes, FNP      PDMP not reviewed this encounter.   Iola Lukes, [place] 11/02/23 [phone]

## 2023-11-04 ENCOUNTER — Ambulatory Visit: Payer: Self-pay

## 2023-11-04 NOTE — Telephone Encounter (Signed)
 FYI Only or Action Required?: Action required by provider: request for appointment.- needs follow up from UC  Patient was last seen in primary care on 07/05/2023 by Celestia Rosaline SQUIBB, NP.  Called Nurse Triage reporting Back Pain.  Symptoms began several days ago.  Interventions attempted: Prescription medications: gabapentin .  Symptoms are: gradually improving.  Triage Disposition: See PCP When Office is Open (Within 3 Days)  Patient/caregiver understands and will follow disposition?: Yes    Copied from CRM #[id number]. Topic: Clinical - Red Word Triage >> Nov 04, 2023 11:06 AM Dedra NOVAK wrote: Red Word that prompted transfer to Nurse Triage: Pt son Eric, said pt was seen in urgent care 10/29 for back pain and is still having the pain. Warm transfer to NT. Reason for Disposition  [1] MODERATE back pain (e.g., interferes with normal activities) AND [2] present > 3 days  Answer Assessment - Initial Assessment Questions Pt's son Eric states that he was just calling to make a follow up appt. Pt was seen in UC on 10/29 for back pain and paperwork said to get a follow up appt. Son states pt is doing better overall. Pain is still intermittent. Son asked to be called to schedule appt. [phone]     1. ONSET: When did the pain begin? (e.g., minutes, hours, days)     chronic 4. PATTERN: Is the pain constant? (e.g., yes, no; constant, intermittent)      intermittent 5. RADIATION: Does the pain shoot into your legs or somewhere else?     no  8. MEDICINES: What have you taken so far for the pain? (e.g., nothing, acetaminophen, NSAIDS)     gabapentin  9. NEUROLOGIC SYMPTOMS: Do you have any weakness, numbness, or problems with bowel/bladder control?     no 10. OTHER SYMPTOMS: Do you have any other symptoms? (e.g., fever, abdomen pain, burning with urination, blood in urine)       no  Protocols used: Back Pain-A-AH

## 2023-11-04 NOTE — Telephone Encounter (Signed)
 Called pt, no answer and could not LVM.

## 2023-11-14 DIAGNOSIS — M5412 Radiculopathy, cervical region: Secondary | ICD-10-CM | POA: Diagnosis not present

## 2023-11-14 DIAGNOSIS — Z6831 Body mass index (BMI) 31.0-31.9, adult: Secondary | ICD-10-CM | POA: Diagnosis not present

## 2023-11-21 ENCOUNTER — Other Ambulatory Visit: Payer: Self-pay

## 2023-12-19 ENCOUNTER — Other Ambulatory Visit: Payer: Self-pay | Admitting: Critical Care Medicine

## 2023-12-19 ENCOUNTER — Other Ambulatory Visit: Payer: Self-pay

## 2023-12-20 ENCOUNTER — Encounter (HOSPITAL_COMMUNITY): Payer: Self-pay

## 2023-12-20 ENCOUNTER — Ambulatory Visit (HOSPITAL_COMMUNITY)
Admission: EM | Admit: 2023-12-20 | Discharge: 2023-12-20 | Disposition: A | Discharge status: Home / Self Care | Source: Home / Self Care

## 2023-12-20 DIAGNOSIS — G8929 Other chronic pain: Secondary | ICD-10-CM

## 2023-12-20 DIAGNOSIS — N400 Enlarged prostate without lower urinary tract symptoms: Secondary | ICD-10-CM

## 2023-12-20 MED ORDER — TAMSULOSIN HCL 0.4 MG PO CAPS
0.8000 mg | ORAL_CAPSULE | Freq: Every day | ORAL | 0 refills | Status: AC
  Filled 2023-12-20: qty 120, 60d supply, fill #0

## 2023-12-20 MED ORDER — PANTOPRAZOLE SODIUM 40 MG PO TBEC
40.0000 mg | DELAYED_RELEASE_TABLET | Freq: Every day | ORAL | 0 refills | Status: AC
  Filled 2023-12-20: qty 14, 14d supply, fill #0

## 2023-12-20 MED ORDER — ACYCLOVIR 5 % EX OINT
1.0000 | TOPICAL_OINTMENT | Freq: Two times a day (BID) | CUTANEOUS | 1 refills | Status: AC | PRN
  Filled 2023-12-20: qty 30, 15d supply, fill #0

## 2023-12-20 NOTE — ED Provider Notes (Signed)
 [facility]    CSN: [id number] Arrival date & time: 12/20/23  1639      History   Chief Complaint Chief Complaint  Patient presents with   Abdominal Pain    HPI Patrick Ortega is a 83 y.o. male.  Medical interpretor used for encounter Intermittent epigastric/right sided abdominal pain for several months. Does not happen every day. Usually only after he eats a large meal, or certain foods like greasy/fried. Mentioned this to provider at end of October, was prescribed Prilosec, but never started it. Denies any current abdominal pain. No associated diarrhea, vomiting, fever or chills. No urinary symptoms  He does have some penile pain around the foreskin, which he has been seen for in the past. He's tried acyclovir  cream that seems to help. His PCP had switched him to lidocaine  but it caused burning. He would like a refill of the acyclovir  cream. Denies any penile lesions, swelling, or testicular pain. Also requests refill for 0.8 mg flomax  that he takes daily for BPH.  He is leaving for [place] in 3 days and wants to bring these meds with him. He returns in February and has PCP appointment then.    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 Authorizing Provider  acyclovir  ointment (ZOVIRAX ) 5 % Apply 1 Application topically 2 (two) times daily as needed. Aplicar en el pene dos veces al dia segun sea necesario para engineer, materials. 12/20/23  Yes Raaga Maeder, Asberry, PA-C  pantoprazole  (PROTONIX ) 40 MG tablet Take 1 tablet (40 mg total) by mouth daily. Una tableta al dia 12/20/23 01/03/24 Yes Lamar Meter, Asberry, PA-C  amLODipine  (NORVASC ) 10 MG tablet Take 1 tablet (10 mg total) by mouth daily. 07/12/23   Celestia Rosaline SQUIBB, NP  gabapentin  (NEURONTIN ) 100 MG capsule Take 1 capsule (100 mg total) by mouth 3 (three) times daily. FOR numbness in arms and hands 04/05/23   Theotis Haze ORN, NP  tamsulosin  (FLOMAX ) 0.4 MG CAPS capsule Take 2 capsules (0.8 mg total) by mouth daily. dos tabletas al dia 12/20/23   Abhinav Mayorquin, Asberry, PA-C    Family History Family History  Problem Relation Age of Onset   Ulcers Mother     Social History Social History[1]   Allergies   Patient has no known allergies.   Review of Systems Review of Systems As per HPI   Physical Exam Triage Vital Signs ED Triage Vitals  Encounter Vitals Group     BP 12/20/23 1813 (!) 153/90     Girls Systolic BP Percentile --      Girls Diastolic BP Percentile --      Boys Systolic BP Percentile --  Boys Diastolic BP Percentile --      Pulse Rate 12/20/23 1813 85     Resp 12/20/23 1813 18     Temp 12/20/23 1813 97.7 F (36.5 C)     Temp Source 12/20/23 1813 Oral     SpO2 12/20/23 1813 98 %     Weight --      Height --      Head Circumference --      Peak Flow --      Pain Score 12/20/23 1812 0     Pain Loc --      Pain Education --      Exclude from Growth Chart --    No data found.  Updated Vital Signs BP (!) 153/90 (BP Location: Left Arm)   Pulse 85   Temp 97.7 F (36.5 C) (Oral)   Resp 18   SpO2 98%   Visual Acuity Right Eye Distance:   Left Eye Distance:   Bilateral Distance:    Right  Eye Near:   Left Eye Near:    Bilateral Near:     Physical Exam Vitals and nursing note reviewed.  Constitutional:      General: He is not in acute distress.    Appearance: Normal appearance. He is not ill-appearing or diaphoretic.  HENT:     Ears:     Comments: Hard of hearing     Mouth/Throat:     Mouth: Mucous membranes are moist.     Pharynx: Oropharynx is clear.  Eyes:     Conjunctiva/sclera: Conjunctivae normal.  Cardiovascular:     Rate and Rhythm: Normal rate and regular rhythm.     Pulses: Normal pulses.     Heart sounds: Normal heart sounds.  Pulmonary:     Effort: Pulmonary effort is normal.     Breath sounds: Normal breath sounds.  Abdominal:     General: Bowel sounds are normal.     Palpations: Abdomen is soft. There is no mass.     Tenderness: There is no abdominal tenderness. There is no right CVA tenderness, left CVA tenderness, guarding or rebound.     Hernia: No hernia is present.  Musculoskeletal:        General: Normal range of motion.     Cervical back: Normal range of motion.  Skin:    General: Skin is warm and dry.  Neurological:     Mental Status: He is alert and oriented to person, place, and time.     UC Treatments / Results  Labs (all labs ordered are listed, but only abnormal results are displayed) Labs Reviewed - No data to display  EKG   Radiology No results found.  Procedures Procedures (including critical care time)  Medications Ordered in UC Medications - No data to display  Initial Impression / Assessment and Plan / UC Course  I have reviewed the triage vital signs and the nursing notes.  Pertinent labs & imaging results that were available during my care of the patient were reviewed by me and considered in my medical decision making (see chart for details).    Afebrile, well appearing, and stable vitals No abdominal pain at this time. Normal abdominal exam. Symptoms are intermittent especially after large meal or  certain foods.  This is likely related to his GERD which he has history of.  He was prescribed Prilosec about 2 months ago but he never picked it up.  I have recommended he start Protonix  once every single day  for 2 weeks. I have sent a refill of the acyclovir  cream per patient request.  He reports this is the most helpful treatment for his pain around the foreskin.  He understands he still needs to follow-up with his primary care regarding continued symptoms.  Have also refilled Flomax .  He takes amlodipine  for his blood pressure, reports he has a 38-month supply and does not need a refill.  When he returns from [place] he is going to see his primary about all of his issues.  I have discussed strict emergency department precautions in the meantime, to which he verbalizes understanding  Final Clinical Impressions(s) / UC Diagnoses   Final diagnoses:  Abdominal pain, chronic, epigastric  Penile pain, chronic  Benign prostatic hyperplasia, unspecified whether lower urinary tract symptoms present     Discharge Instructions      Keep taking your blood pressure medicine every day (amlodipine ) Start taking pantoprazole  every day (this is for your stomach!) I have refilled your flomax  which is the medicine for your prostate. Take two tablets together daily.  I have also refilled the acyclovir  ointment. You can apply twice daily to the penis for pain. Please call your primary care provider for a follow up Please go to the emergency department if symptoms worsen.  Contine tomando su medicamento para la presin arterial todos los das (amlodipino). Comience a tomar pantoprazol todos los [phone] (es para investment banker, corporate!). Le he renovado la receta de Flomax , que es el medicamento para la prstata. [employer] comprimidos juntos al [employer]. Tambin le he renovado la receta de la pomada de aciclovir. Puede aplicarla dos veces al da en el pene para engineer, materials. Por favor, llame a su mdico de cabecera para una  cita de seguimiento. Si los sntomas empeoran, acuda al servicio de [place].    ED Prescriptions     Medication Sig Dispense Auth. Provider   pantoprazole  (PROTONIX ) 40 MG tablet Take 1 tablet (40 mg total) by mouth daily. Una tableta al dia 14 tablet Carmina Walle, PA-C   tamsulosin  (FLOMAX ) 0.4 MG CAPS capsule Take 2 capsules (0.8 mg total) by mouth daily. dos tabletas al dia 120 capsule Eschol Auxier, PA-C   acyclovir  ointment (ZOVIRAX ) 5 % Apply 1 Application topically 2 (two) times daily as needed. Aplicar en el pene dos veces al dia segun sea necesario para engineer, materials. 30 g Theola Cuellar, Asberry, PA-C      PDMP not reviewed this encounter.     [1]  Social History Tobacco Use   Smoking status: Former    Current packs/day: 0.25    Average packs/day: 0.3 packs/day for 0.5 years (0.1 ttl pk-yrs)    Types: Cigarettes   Smokeless tobacco: Never  Vaping Use   Vaping status: Never Used  Substance Use Topics   Alcohol use: Not Currently   Drug use: Not Currently     Jeryl Asberry, DEVONNA 12/20/23 2130

## 2023-12-20 NOTE — Discharge Instructions (Addendum)
 Keep taking your blood pressure medicine every day (amlodipine ) Start taking pantoprazole  every day (this is for your stomach!) I have refilled your flomax  which is the medicine for your prostate. Take two tablets together daily.  I have also refilled the acyclovir  ointment. You can apply twice daily to the penis for pain. Please call your primary care provider for a follow up Please go to the emergency department if symptoms worsen.  Contine tomando su medicamento para la presin arterial todos los das (amlodipino). Comience a tomar pantoprazol todos los [phone] (es para investment banker, corporate!). Le he renovado la receta de Flomax , que es el medicamento para la prstata. [employer] comprimidos juntos al [employer]. Tambin le he renovado la receta de la pomada de aciclovir. Puede aplicarla dos veces al da en el pene para engineer, materials. Por favor, llame a su mdico de cabecera para una cita de seguimiento. Si los sntomas empeoran, acuda al servicio de [place].

## 2023-12-20 NOTE — ED Triage Notes (Signed)
 Per interpreter Jesus [phone], pt c/o intermittent rt center abdominal pain x15 days. Denies pain at this time. States last NBM today.  Pt asking for refill for Acyclovir  5% ointment.

## 2023-12-21 ENCOUNTER — Other Ambulatory Visit: Payer: Self-pay

## 2023-12-21 ENCOUNTER — Telehealth: Payer: Self-pay | Admitting: Primary Care

## 2023-12-21 NOTE — Telephone Encounter (Addendum)
 Patient came in stating he needs patient assistance for acyclovir  ointment (ZOVIRAX ) 5 % he states its $500 and can't afford it.  Advised patient to ask downstairs at the pharmacy. Patient requested for PCP to prescribe medication instead, advised patient he may need to be seen by her first in order for her to write the prescription.

## 2023-12-23 NOTE — Telephone Encounter (Signed)
 Pt was seen at the urgent care 12/20/23. Pt was prescribed the medication listed in note but it cost to much. Are you able to change medication to an affordable one

## 2024-01-12 ENCOUNTER — Other Ambulatory Visit: Payer: Self-pay

## 2024-02-08 ENCOUNTER — Other Ambulatory Visit: Payer: Self-pay

## 2024-02-08 ENCOUNTER — Other Ambulatory Visit: Payer: Self-pay | Admitting: Primary Care

## 2024-02-08 ENCOUNTER — Other Ambulatory Visit (INDEPENDENT_AMBULATORY_CARE_PROVIDER_SITE_OTHER): Payer: Self-pay | Admitting: Primary Care

## 2024-02-09 ENCOUNTER — Other Ambulatory Visit: Payer: Self-pay

## 2024-02-09 MED ORDER — AMLODIPINE BESYLATE 10 MG PO TABS
10.0000 mg | ORAL_TABLET | Freq: Every day | ORAL | 0 refills | Status: AC
  Filled 2024-02-09: qty 30, 30d supply, fill #0

## 2024-02-10 NOTE — Telephone Encounter (Signed)
 Requested medication (s) are due for refill today: yes  Requested medication (s) are on the active medication list: yes  Last refill:  12/20/23 #120  Future visit scheduled: no  Notes to clinic:  overdue lab work //last ordered by PA at Urgent Care   Requested Prescriptions  Pending Prescriptions Disp Refills   tamsulosin  (FLOMAX ) 0.4 MG CAPS capsule 120 capsule 0    Sig: Take 2 capsules (0.8 mg total) by mouth daily. dos tabletas al dia     Urology: Alpha-Adrenergic Blocker Failed - 02/10/2024  1:30 PM      Failed - PSA in normal range and within 360 days    Prostate Specific Ag, Serum  Date Value Ref Range Status  05/25/2021 8.2 (H) 0.0 - 4.0 ng/mL Final    Comment:    Roche ECLIA methodology. According to the American Urological Association, Serum PSA should decrease and remain at undetectable levels after radical prostatectomy. The AUA defines biochemical recurrence as an initial PSA value 0.2 ng/mL or greater followed by a subsequent confirmatory PSA value 0.2 ng/mL or greater. Values obtained with different assay methods or kits cannot be used interchangeably. Results cannot be interpreted as absolute evidence of the presence or absence of malignant disease.          Failed - Last BP in normal range    BP Readings from Last 1 Encounters:  12/20/23 (!) 153/90         Passed - Valid encounter within last 12 months    Recent Outpatient Visits           7 months ago Strain of left trapezius muscle, initial encounter   [place] [facility] Celestia Rosaline SQUIBB, NP   8 months ago Essential hypertension   [place] [facility] Celestia Rosaline SQUIBB, NP   10 months ago Cervical radiculopathy   [facility] [place] Theotis Haze ORN, NP   1 year ago Cervical disc disease    [facility] Celestia Rosaline SQUIBB, NP   1 year ago Essential hypertension   [facility] [place]. [facility] Delbert Clam, MD

## 2024-02-10 NOTE — Telephone Encounter (Signed)
 Will forward to provider
